# Patient Record
Sex: Female | Born: 1975 | Race: White | Hispanic: Yes | Marital: Single | Smoking: Never smoker
Health system: Southern US, Community
[De-identification: ages and names within clinical notes are randomized; demographics above are authoritative.]

## PROBLEM LIST (undated history)

## (undated) DIAGNOSIS — Z789 Other specified health status: Secondary | ICD-10-CM

## (undated) HISTORY — PX: NO PAST SURGERIES: SHX2092

## (undated) MED FILL — Goserelin Acetate Implant 3.6 MG: SUBCUTANEOUS | Qty: 3.6 | Status: AC

---

## 2008-04-27 ENCOUNTER — Ambulatory Visit (HOSPITAL_COMMUNITY): Admission: RE | Admit: 2008-04-27 | Discharge: 2008-04-27 | Payer: Self-pay | Admitting: Family Medicine

## 2008-04-27 IMAGING — US US BREAST*R*
1 series · 10 of 10 positions shown · non-contrast
Comparison: None

CLINICAL DATA: .  Thickening at 10 o'clock in the right breast.
History of prior benign surgical excisional biopsy on the left.

DIGITAL DIAGNOSTIC  BILATERAL  MAMMOGRAM  WITH CAD AND THE RIGHT
BREAST ULTRASOUND:

[Series 1: right breast · 0.07mm/px · 10 of 10 slices shown]
[im 1/10]
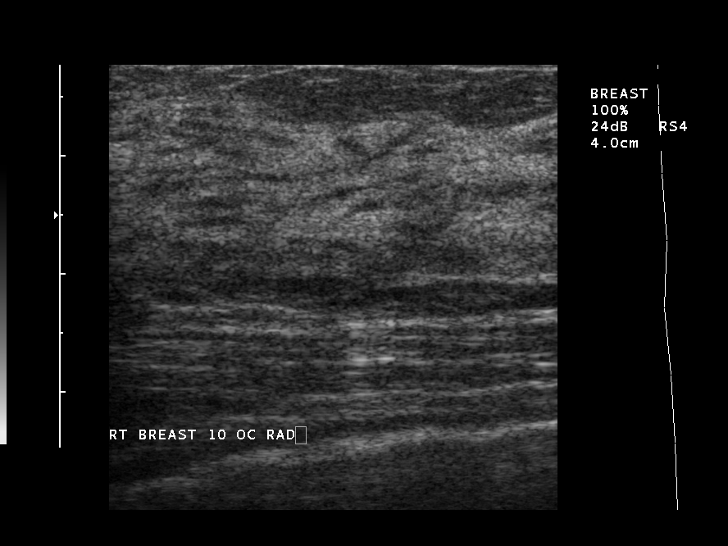
[im 2/10]
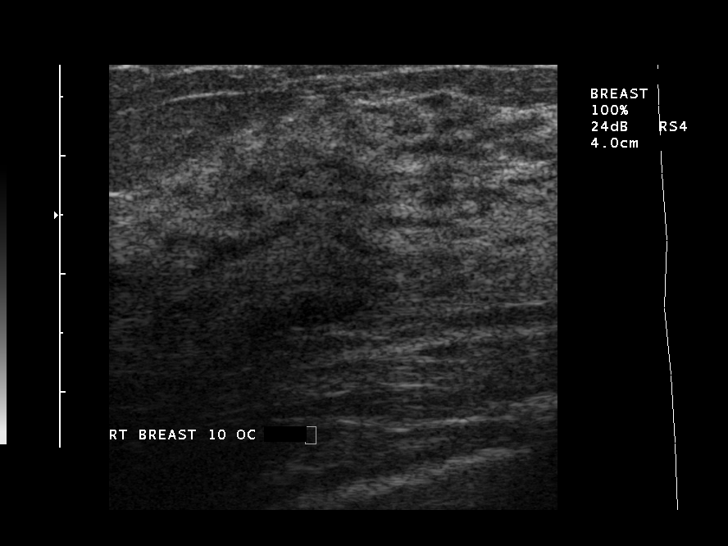
[im 3/10]
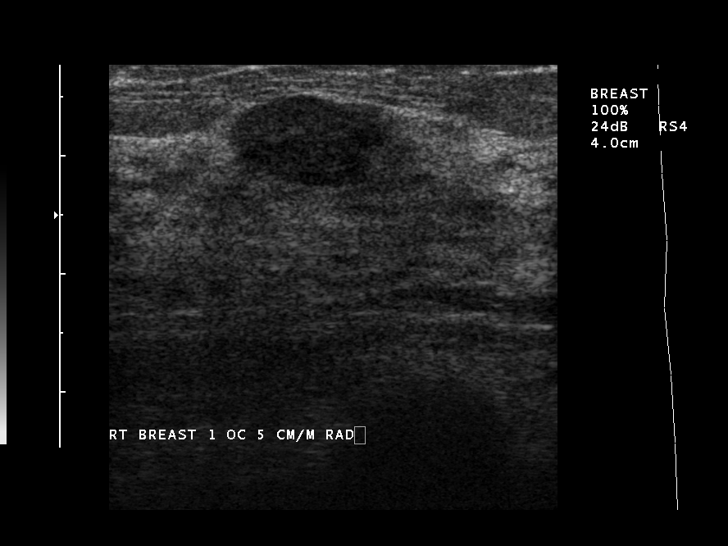
[im 4/10]
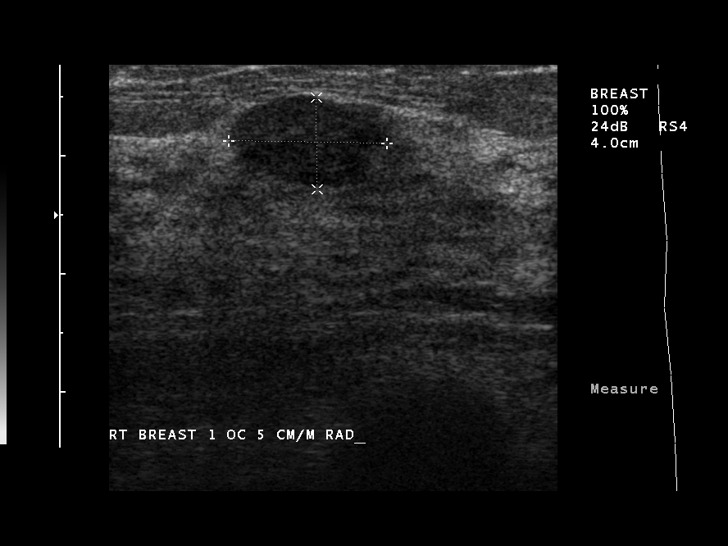
[im 5/10]
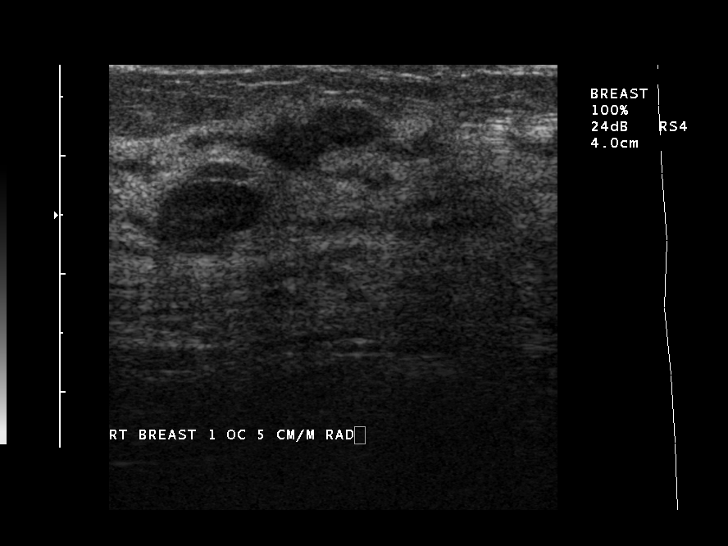
[im 6/10]
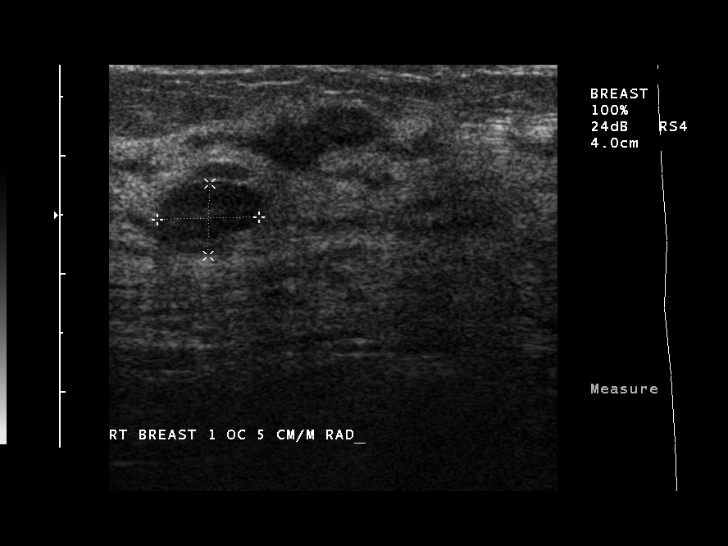
[im 7/10]
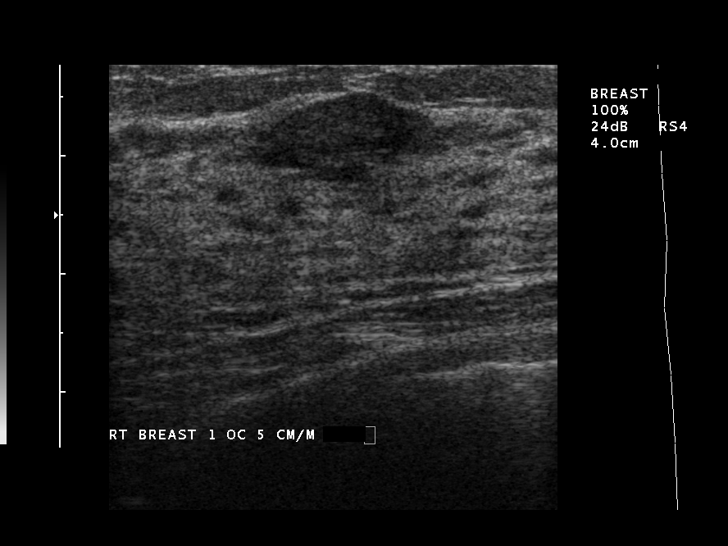
[im 8/10]
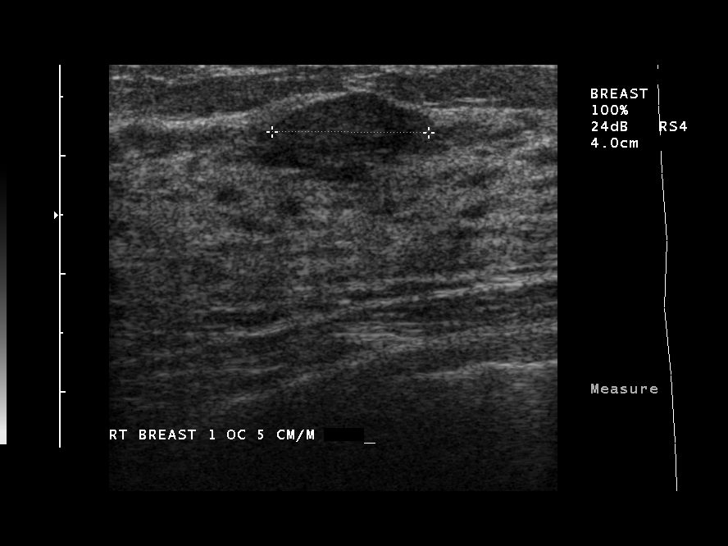
[im 9/10]
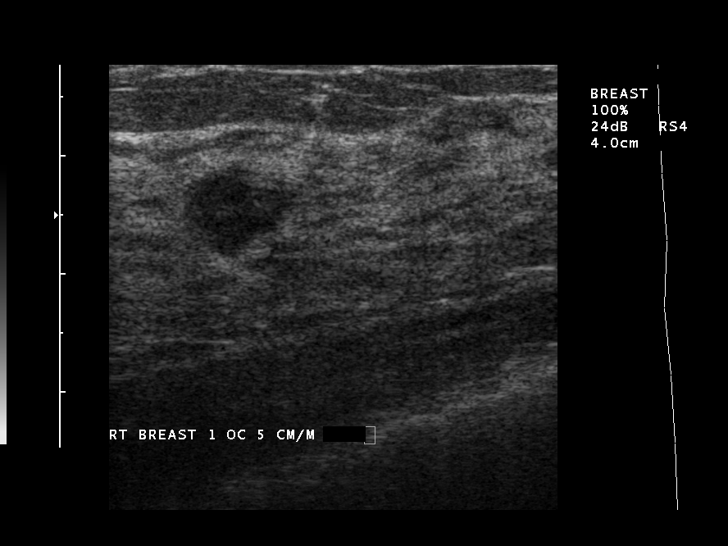
[im 10/10]
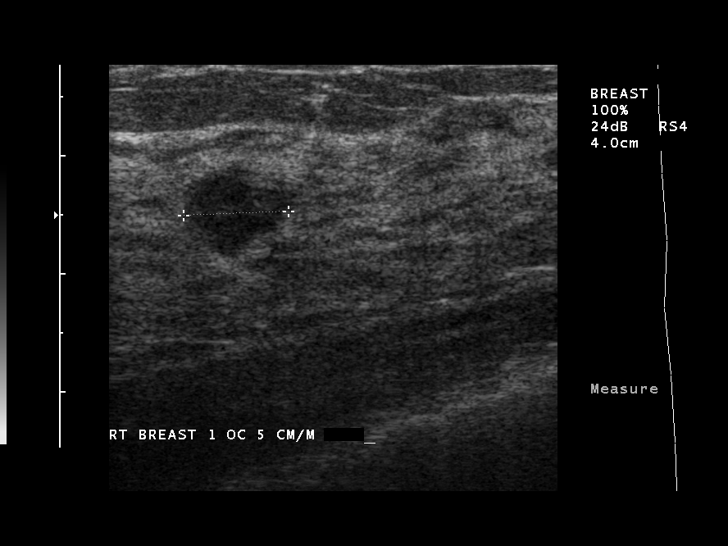

[10 of 10 positions shown; findings below may reference images not displayed]

FINDINGS: The breast tissue is extremely dense.  There is no
dominant mass, architectural distortion or calcification to suggest
malignancy.

On physical exam, no mass is palpated in the upper portion of the
right breast.

Ultrasound is performed, showing dense fibroglandular tissue in the
10 o'clock position of the right breast.  At 1 o'clock, there are
two adjacent oval homogeneous solid masses 5 cm from the right
nipple measuring 13 x 8 x 13 mm and 9 x 9 x 6 mm.  These are felt
very likely to represent benign fibroadenomas.  Findings were
discussed with the patient's husband at her request.  Options of
short interval follow-up ultrasound, needle biopsy and surgical
excisional biopsy were discussed.  I have recommended short
interval follow-up ultrasound in 6, 12 and 24 months.  The patient
and her husband agreed with this plan.
IMPRESSION: Probably benign fibroadenomas at 1 o'clock 5 cm from the right
nipple as described above.  Suggest short interval follow-up
ultrasound of the right breast in six, 12 and 24 months.

BI-RADS CATEGORY 3:  Probably benign finding(s) - short interval
follow-up suggested.

## 2009-08-15 ENCOUNTER — Ambulatory Visit (HOSPITAL_COMMUNITY): Admission: RE | Admit: 2009-08-15 | Discharge: 2009-08-15 | Payer: Self-pay | Admitting: Family Medicine

## 2009-08-15 IMAGING — MG MM DIGITAL DIAGNOSTIC BILAT
4 series · 4 of 4 positions shown · non-contrast
Comparison: [DATE]

CLINICAL DATA: The patient did not return for recommended 6-month
follow-up ultrasound of the right breast for probably benign
fibroadenomas.

DIGITAL DIAGNOSTIC BILATERAL MAMMOGRAM WITH CAD AND RIGHT BREAST
ULTRASOUND:

[L CC]
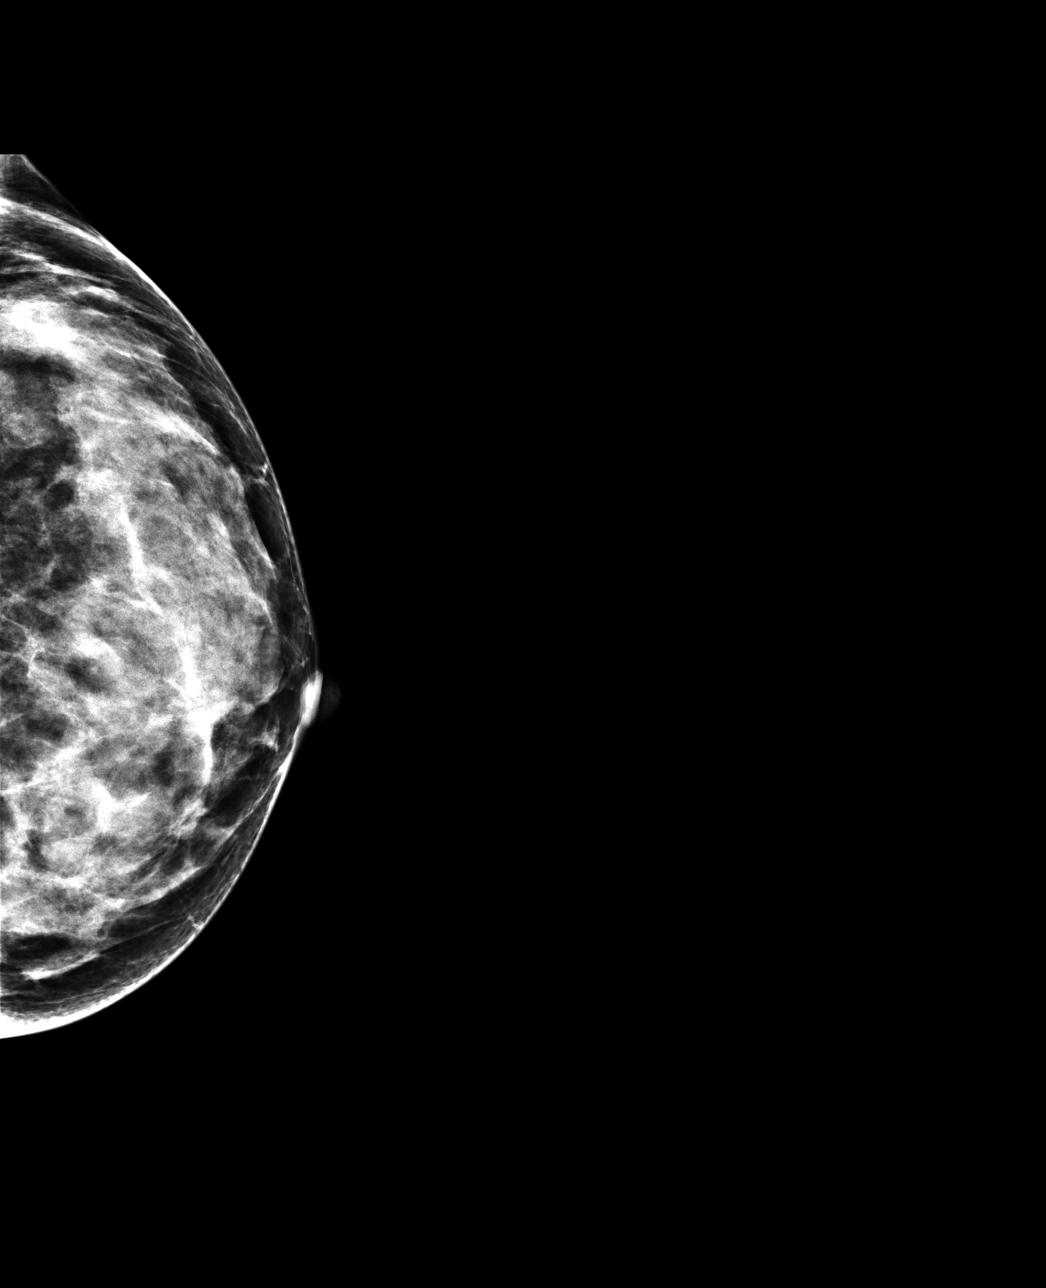

[L MLO]
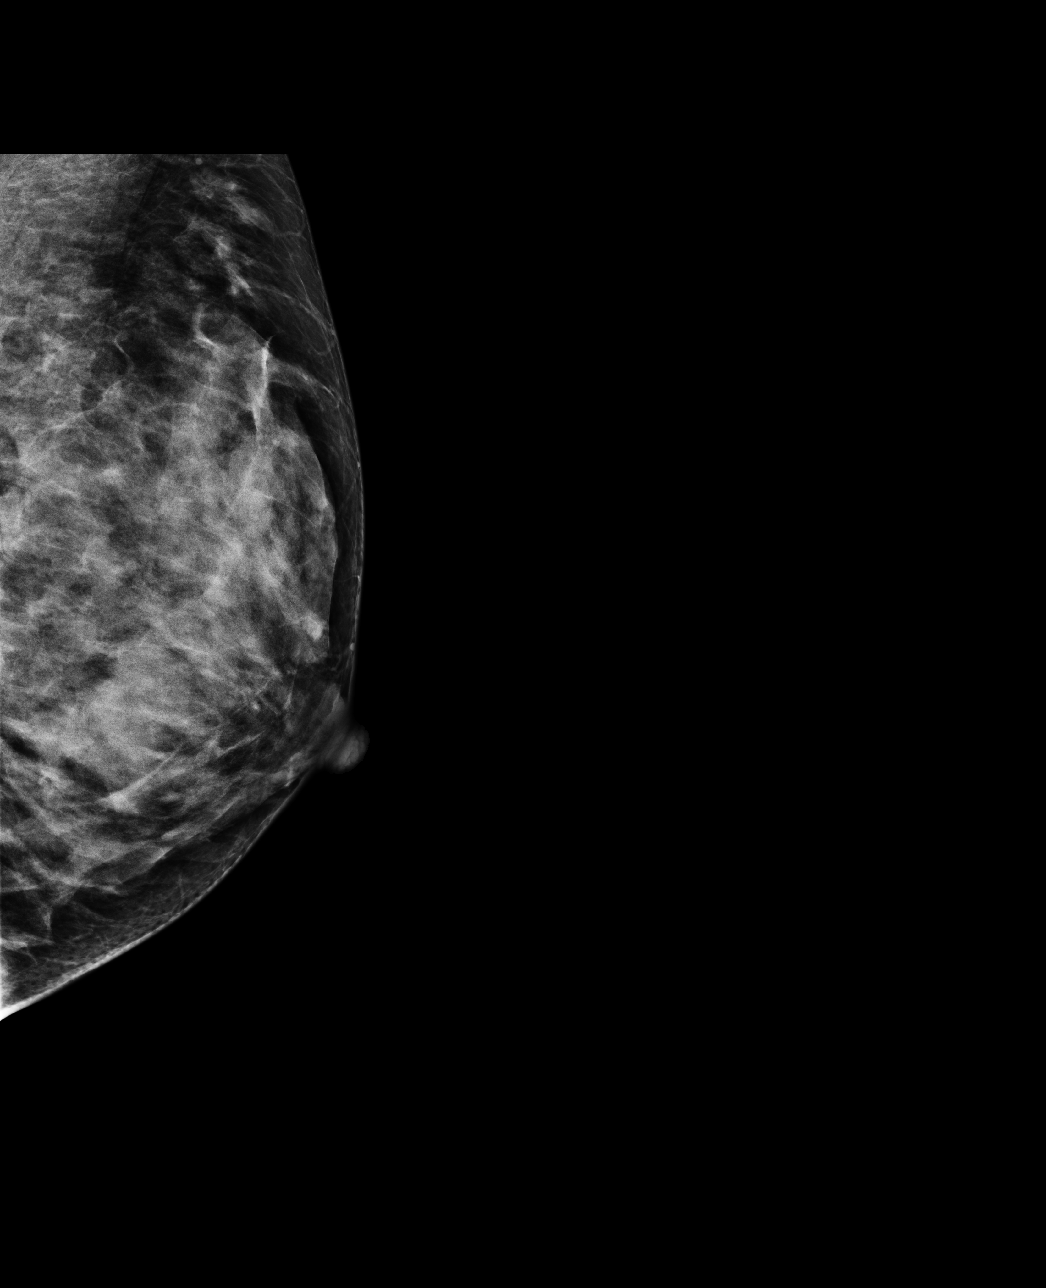

[R CC]
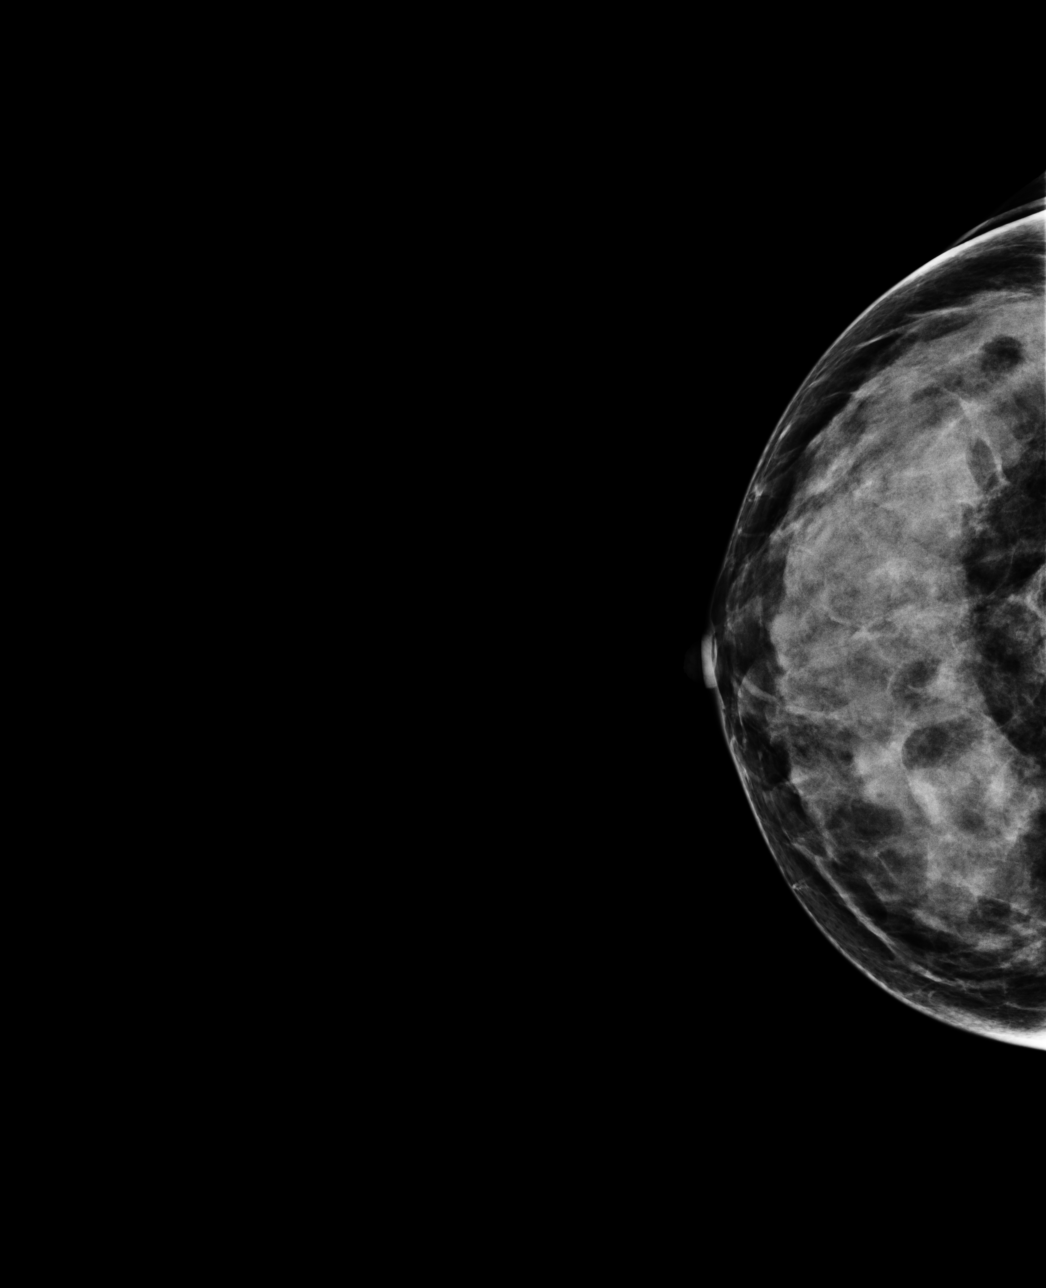

[R MLO]
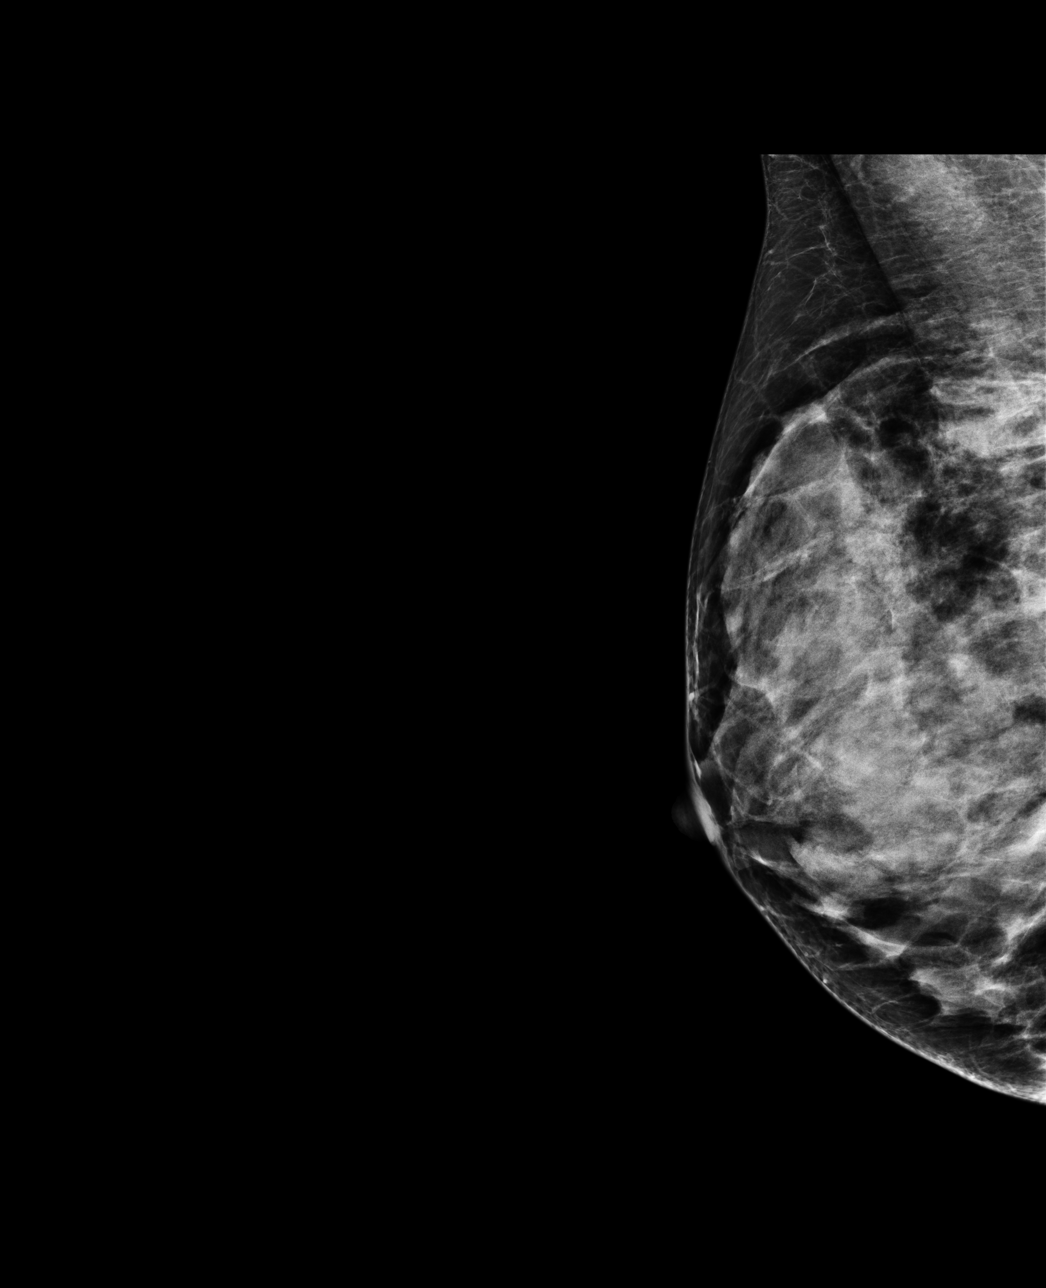

[4 of 4 positions shown; findings below may reference images not displayed]

FINDINGS: The breast tissue is extremely dense.  There is no
dominant mass, architectural distortion or calcification to suggest
malignancy.
Mammographic images were processed with CAD.

On physical exam, no mass is palpated in the right upper inner
quadrant.

Ultrasound is performed, showing two adjacent oval homogeneous
solid horizontally oriented well-defined masses at 1 o'clock, 5 cm
from the right nipple, measuring 10 x 6 x 8 mm and 14 x 13 x 6 mm.
These are stable as compared to prior study.  There is a third
similar mass at 1 o'clock 4 cm from the right nipple measuring 14 x
7 x 10 mm.  These are very likely benign fibroadenomas.  I would
suggest follow-up right breast ultrasound in 12 months.
IMPRESSION: Probably benign fibroadenomas in the right upper inner quadrant as
described above.  Suggest follow-up right breast ultrasound in 12
months.

BI-RADS CATEGORY 3:  Probably benign finding(s) - short interval
follow-up suggested.

## 2009-08-15 IMAGING — US US BREAST*R*
1 series · 12 of 12 positions shown · non-contrast
Comparison: [DATE]

CLINICAL DATA: The patient did not return for recommended 6-month
follow-up ultrasound of the right breast for probably benign
fibroadenomas.

DIGITAL DIAGNOSTIC BILATERAL MAMMOGRAM WITH CAD AND RIGHT BREAST
ULTRASOUND:

[Series 1: us breast*right* · 0.08mm/px · 12 of 12 slices shown]
[im 1/12]
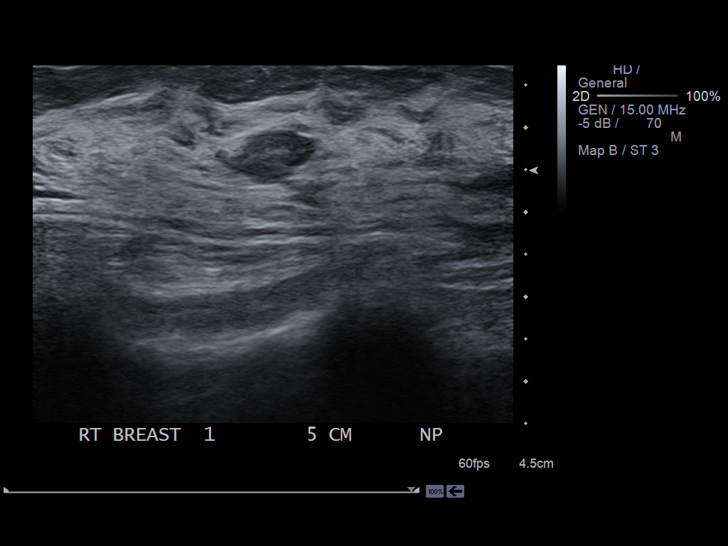
[im 2/12]
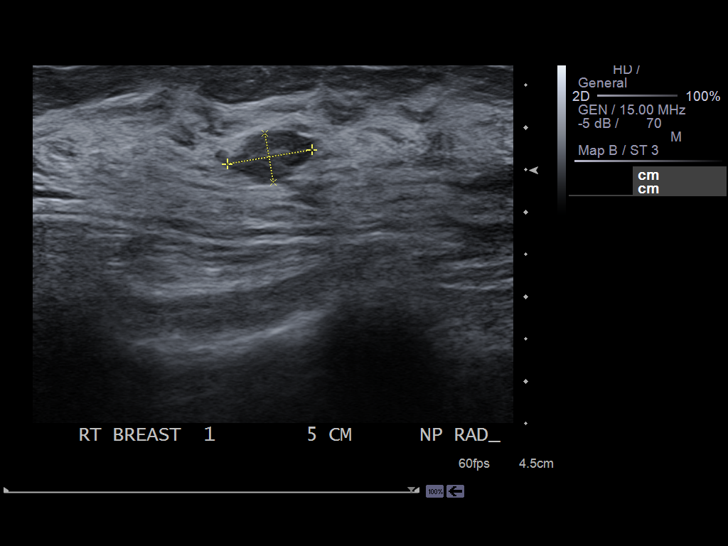
[im 3/12]
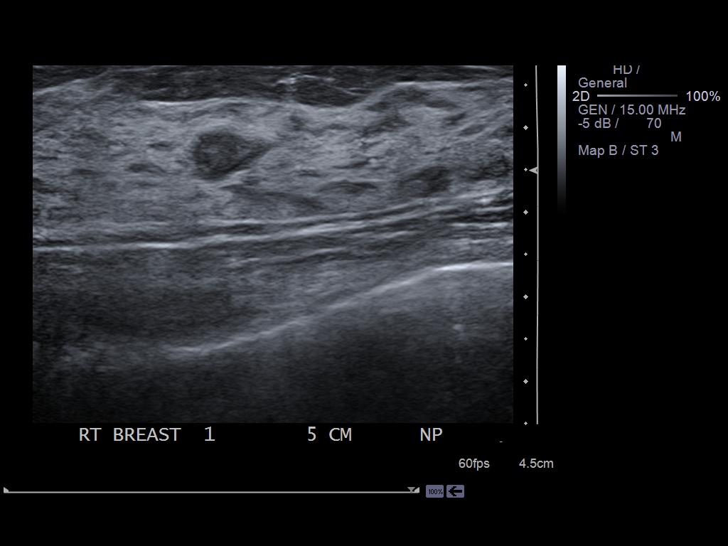
[im 4/12]
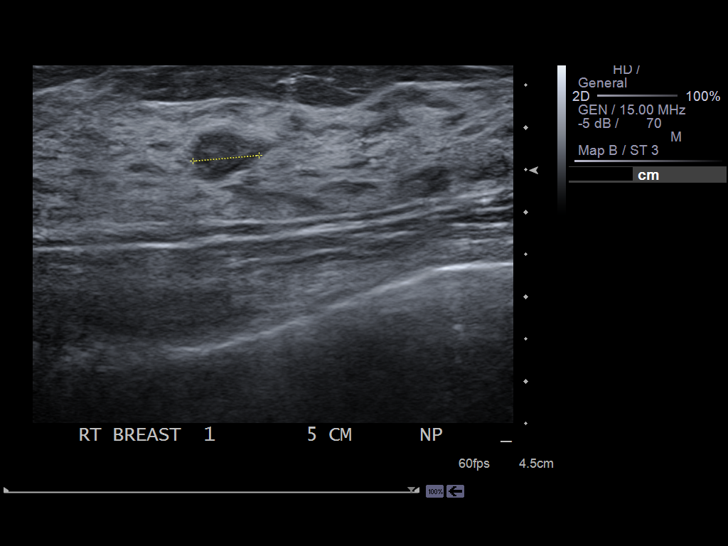
[im 5/12]
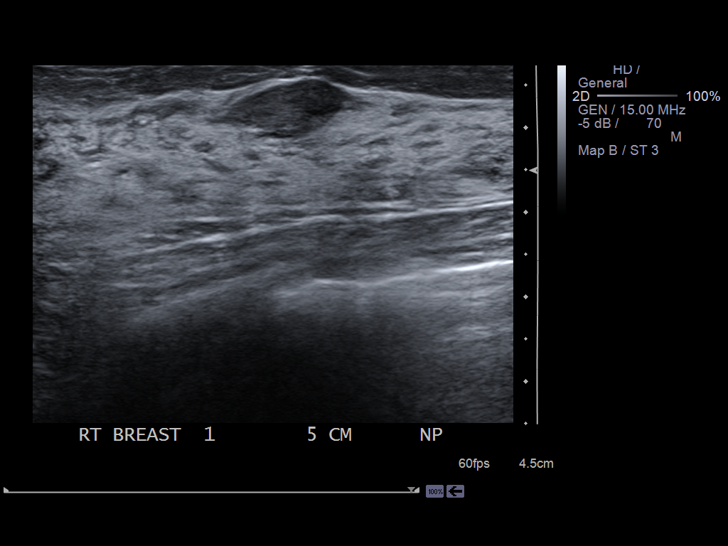
[im 6/12]
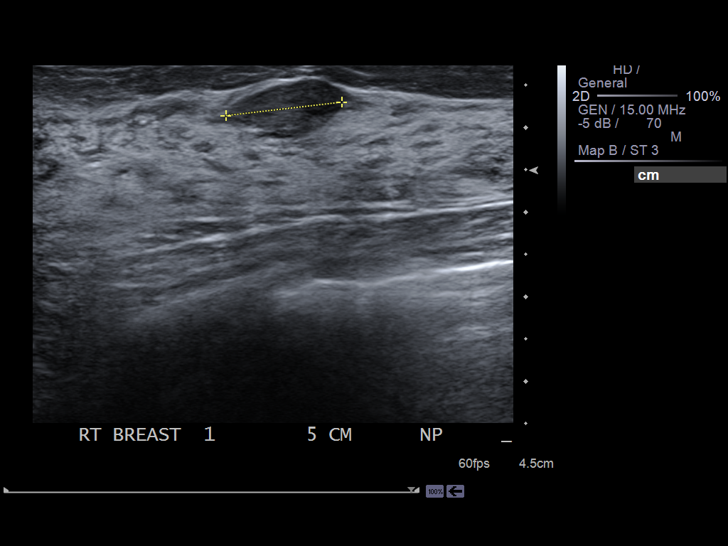
[im 7/12]
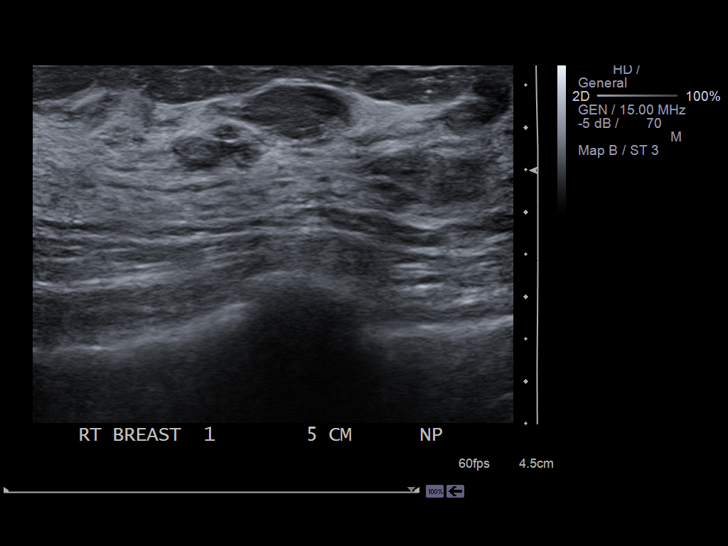
[im 8/12]
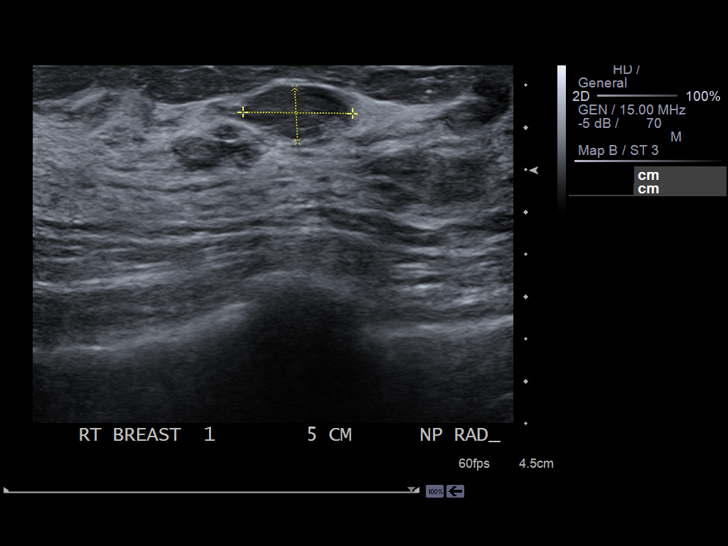
[im 9/12]
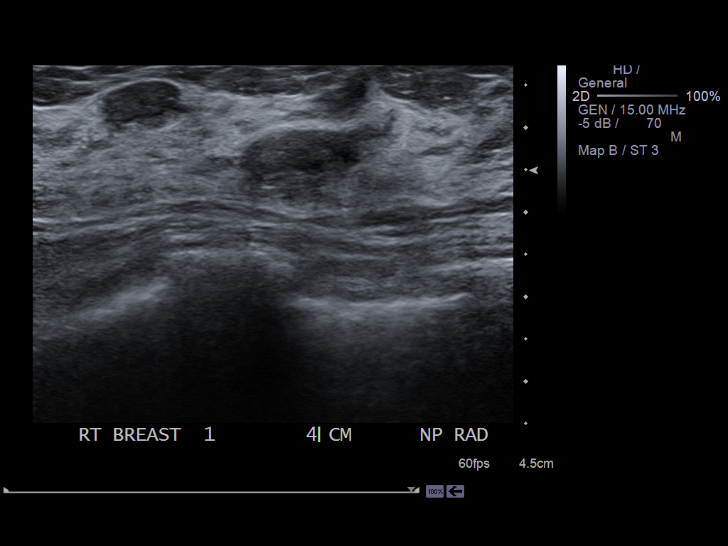
[im 10/12]
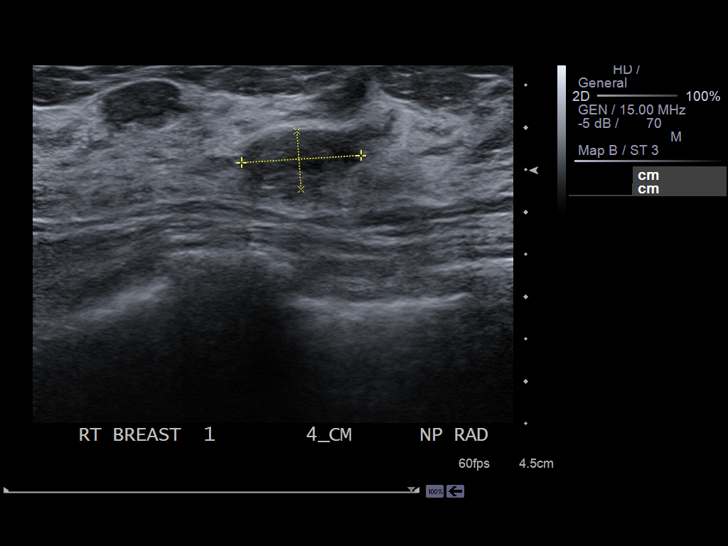
[im 11/12]
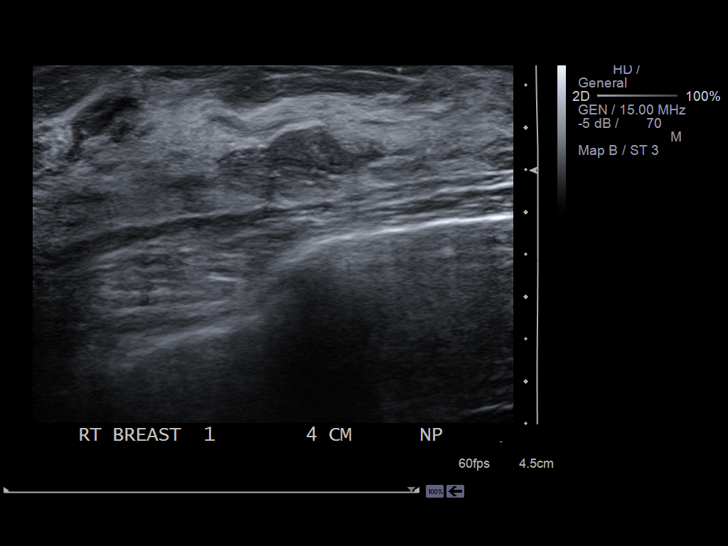
[im 12/12]
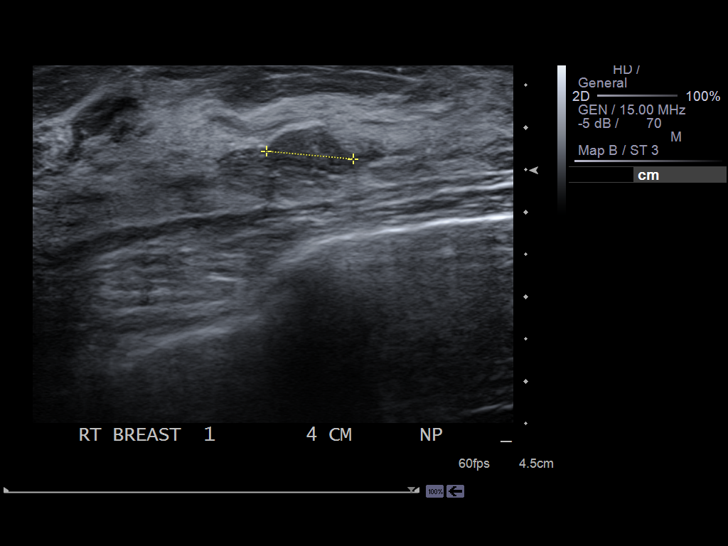

[12 of 12 positions shown; findings below may reference images not displayed]

FINDINGS: The breast tissue is extremely dense.  There is no
dominant mass, architectural distortion or calcification to suggest
malignancy.
Mammographic images were processed with CAD.

On physical exam, no mass is palpated in the right upper inner
quadrant.

Ultrasound is performed, showing two adjacent oval homogeneous
solid horizontally oriented well-defined masses at 1 o'clock, 5 cm
from the right nipple, measuring 10 x 6 x 8 mm and 14 x 13 x 6 mm.
These are stable as compared to prior study.  There is a third
similar mass at 1 o'clock 4 cm from the right nipple measuring 14 x
7 x 10 mm.  These are very likely benign fibroadenomas.  I would
suggest follow-up right breast ultrasound in 12 months.
IMPRESSION: Probably benign fibroadenomas in the right upper inner quadrant as
described above.  Suggest follow-up right breast ultrasound in 12
months.

BI-RADS CATEGORY 3:  Probably benign finding(s) - short interval
follow-up suggested.

## 2010-03-17 ENCOUNTER — Encounter: Payer: Self-pay | Admitting: Family Medicine

## 2019-04-26 ENCOUNTER — Other Ambulatory Visit (HOSPITAL_COMMUNITY): Payer: Self-pay | Admitting: Nurse Practitioner

## 2019-04-26 DIAGNOSIS — Z1231 Encounter for screening mammogram for malignant neoplasm of breast: Secondary | ICD-10-CM

## 2019-06-17 ENCOUNTER — Ambulatory Visit (HOSPITAL_COMMUNITY): Payer: Self-pay

## 2021-04-29 ENCOUNTER — Other Ambulatory Visit (HOSPITAL_COMMUNITY): Payer: Self-pay | Admitting: Radiology

## 2021-04-29 ENCOUNTER — Other Ambulatory Visit (HOSPITAL_COMMUNITY): Payer: Self-pay | Admitting: Obstetrics and Gynecology

## 2021-04-29 DIAGNOSIS — N6310 Unspecified lump in the right breast, unspecified quadrant: Secondary | ICD-10-CM

## 2021-04-30 ENCOUNTER — Other Ambulatory Visit: Payer: Self-pay

## 2021-04-30 ENCOUNTER — Encounter (INDEPENDENT_AMBULATORY_CARE_PROVIDER_SITE_OTHER): Payer: Self-pay

## 2021-04-30 ENCOUNTER — Ambulatory Visit: Payer: Self-pay | Admitting: *Deleted

## 2021-04-30 VITALS — BP 126/76 | Wt 148.2 lb

## 2021-04-30 DIAGNOSIS — N6311 Unspecified lump in the right breast, upper outer quadrant: Secondary | ICD-10-CM

## 2021-04-30 DIAGNOSIS — Z1239 Encounter for other screening for malignant neoplasm of breast: Secondary | ICD-10-CM

## 2021-04-30 NOTE — Patient Instructions (Signed)
Explained breast self awareness with [facility]. Patient did not need a Pap smear today due to last Pap smear was 04/28/2019. Let her know [facility]CP will cover Pap smears every 3 years unless has a history of abnormal Pap smears. Referred patient to [place] for a diagnostic mammogram. Appointment scheduled Tuesday, May 14, 2021 at 1430. Patient aware of appointment and will be there. Verania Farish verbalized understanding. ? ?Christop Hippert, Arvil Chaco, RN ?3:10 PM ? ? ? ? ?

## 2021-04-30 NOTE — Progress Notes (Signed)
Hannah Murray is a 46 y.o. female who presents to [facility] clinic today with complaint of right breast lump since 04/25/2021.  ?  ?Pap Smear: Pap smear not completed today. Last Pap smear was 04/28/2019 at [facility] clinic and was normal. Per patient has no history of an abnormal Pap smear. Last Pap smear result is not available in Epic. Last Pap smear result will be scanned into Epic. ?  ?Physical exam: ?Breasts ?Breasts symmetrical. No skin abnormalities bilateral breasts. No nipple retraction bilateral breasts. No nipple discharge bilateral breasts. No lymphadenopathy. No lumps palpated left breast. Palpated a 8 cm x 8 cm lump within the right breast between 8 o'clock and 11 o'clock 3 cm from the nipple. No complaints of pain or tenderness on exam.   ?  ?Pelvic/Bimanual ?Pap is not indicated today per [facility]CP guidelines. ?  ?Smoking History: ?Patient has never smoked. ?  ?Patient Navigation: ?Patient education provided. Access to services provided for patient through [place] program. Spanish interpreter Rudene Anda from [facility] provided.  ? ?Colorectal Cancer Screening: ?Per patient has never had colonoscopy completed. No complaints today.  ?  ?Breast and Cervical Cancer Risk Assessment: ?Patient does not have family history of breast cancer, known genetic mutations, or radiation treatment to the chest before age 40. Patient does not have history of cervical dysplasia, immunocompromised, or DES exposure in-utero. ? ?Risk Assessment   ? ? Risk Scores   ? ?   04/30/2021  ? Last edited by: Demetrius Revel, LPN  ? 5-year risk: 1.1 %  ? Lifetime risk: 13.4 %  ? ?  ?  ? ?  ? ? ?A: ?BCCCP exam without pap smear ?Complaint of right breast lump. ? ?P: ?Referred patient to [place] for a diagnostic mammogram. Appointment scheduled Tuesday, May 14, 2021 at 1430. ? ?Loletta Parish, RN ?04/30/2021 3:10 PM   ?

## 2021-05-06 ENCOUNTER — Ambulatory Visit (HOSPITAL_COMMUNITY)
Admission: RE | Admit: 2021-05-06 | Discharge: 2021-05-06 | Disposition: A | Discharge status: Home / Self Care | Payer: Self-pay | Source: Ambulatory Visit | Attending: Obstetrics and Gynecology | Admitting: Obstetrics and Gynecology

## 2021-05-06 ENCOUNTER — Other Ambulatory Visit: Payer: Self-pay

## 2021-05-06 ENCOUNTER — Other Ambulatory Visit (HOSPITAL_COMMUNITY): Payer: Self-pay | Admitting: Obstetrics and Gynecology

## 2021-05-06 DIAGNOSIS — R928 Other abnormal and inconclusive findings on diagnostic imaging of breast: Secondary | ICD-10-CM

## 2021-05-06 DIAGNOSIS — N6310 Unspecified lump in the right breast, unspecified quadrant: Secondary | ICD-10-CM | POA: Insufficient documentation

## 2021-05-06 IMAGING — US US BREAST*R* LIMITED INC AXILLA
1 series · 13 of 14 positions shown · non-contrast
Comparison: Previous exam(s).

CLINICAL DATA: 43-year-old female presenting for evaluation of a
palpable lump in the upper-outer right breast.

EXAM:
DIGITAL DIAGNOSTIC BILATERAL MAMMOGRAM WITH TOMOSYNTHESIS AND CAD;
ULTRASOUND RIGHT BREAST LIMITED
TECHNIQUE: Bilateral digital diagnostic mammography and breast tomosynthesis
was performed. The images were evaluated with computer-aided
detection.; Targeted ultrasound examination of the right breast was
performed

[Series 1: us breast*right* limited inc axilla · 0.07mm/px · 14 acquisitions, 13 frames shown]
[im 1/14]
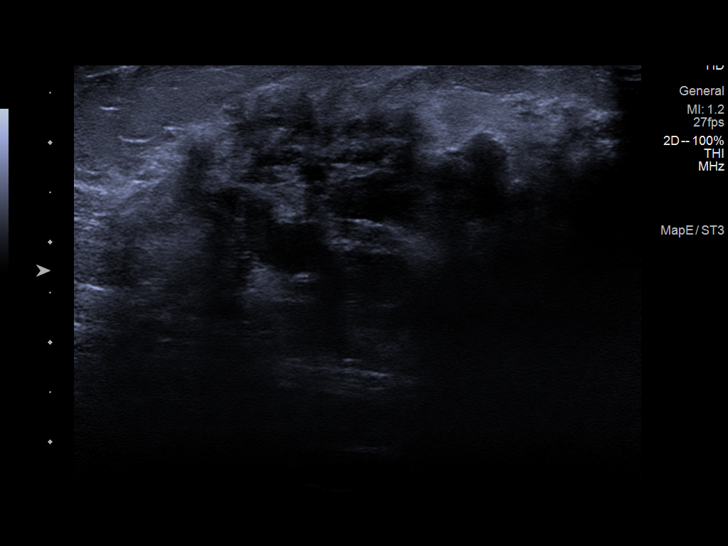
[im 2/14]
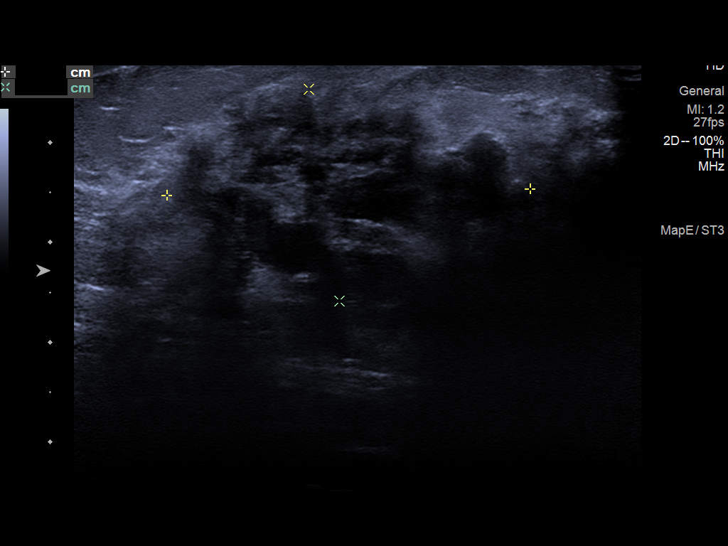
[im 3/14]
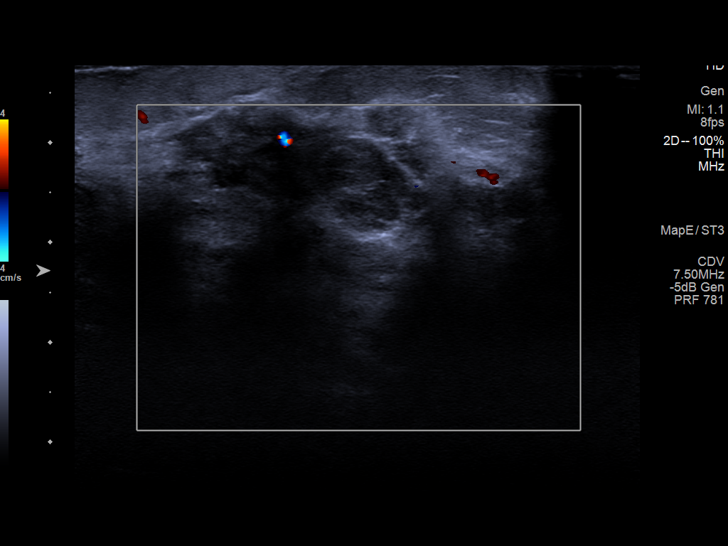
[im 4/14]
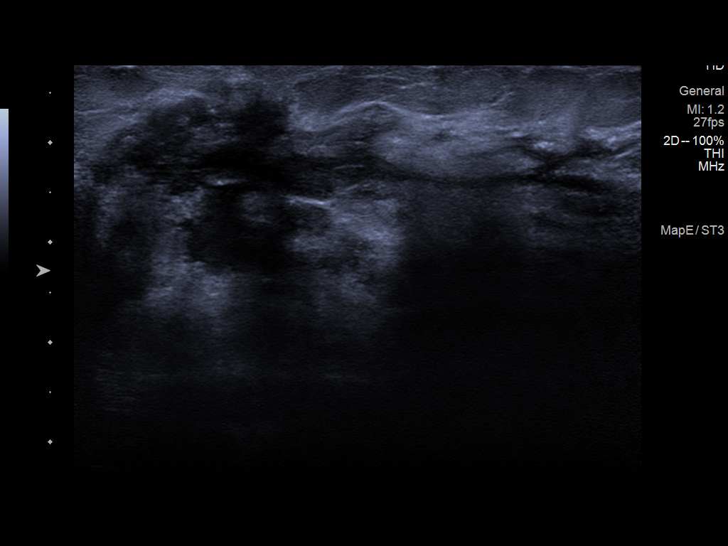
[im 5/14]
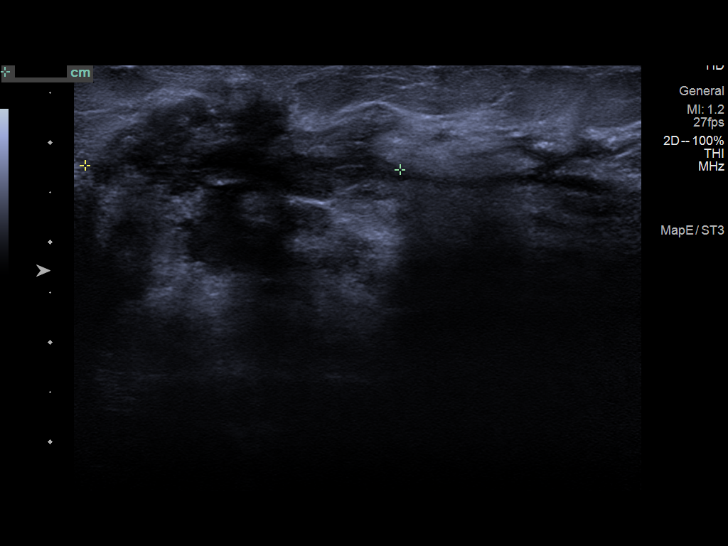
[im 6/14]
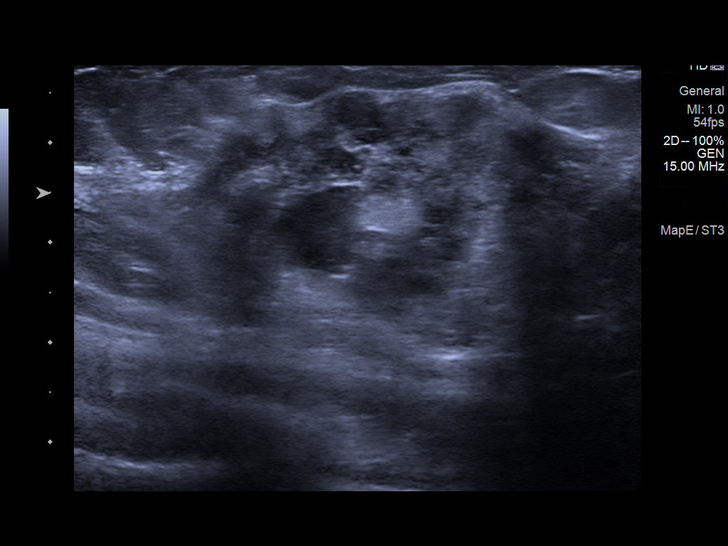
[im 8/14]
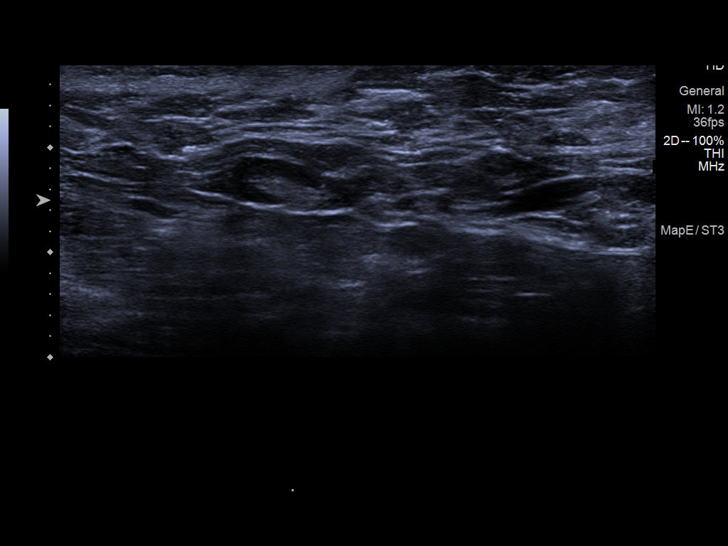
[im 9/14]
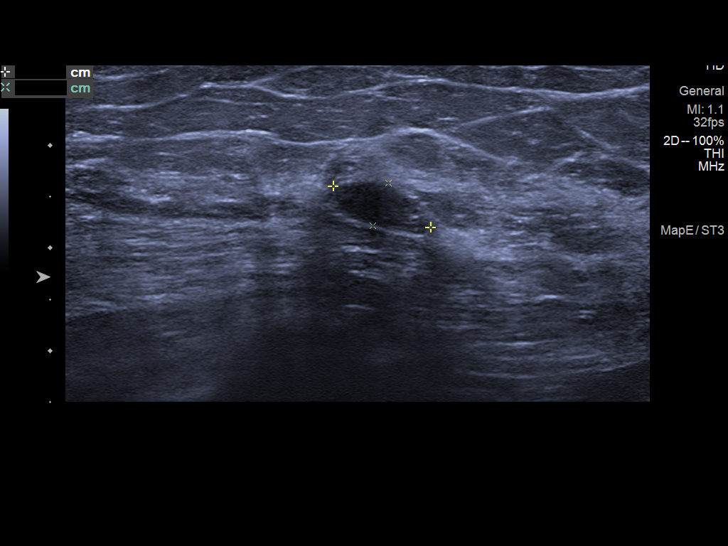
[im 10/14]
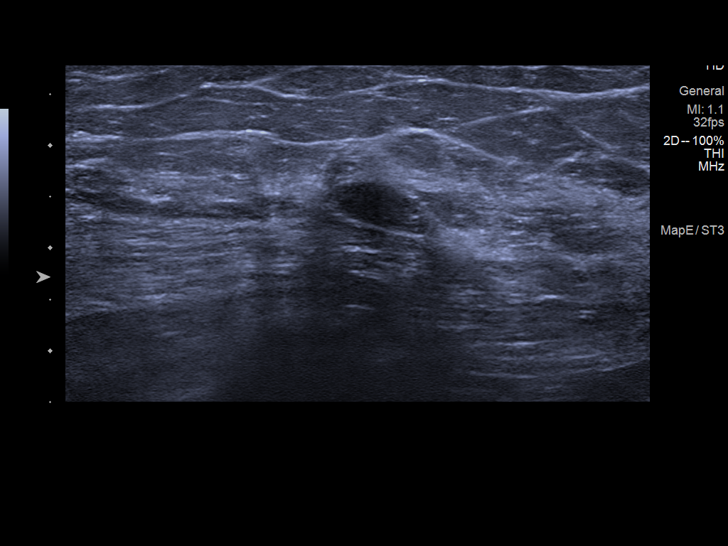
[im 11/14]
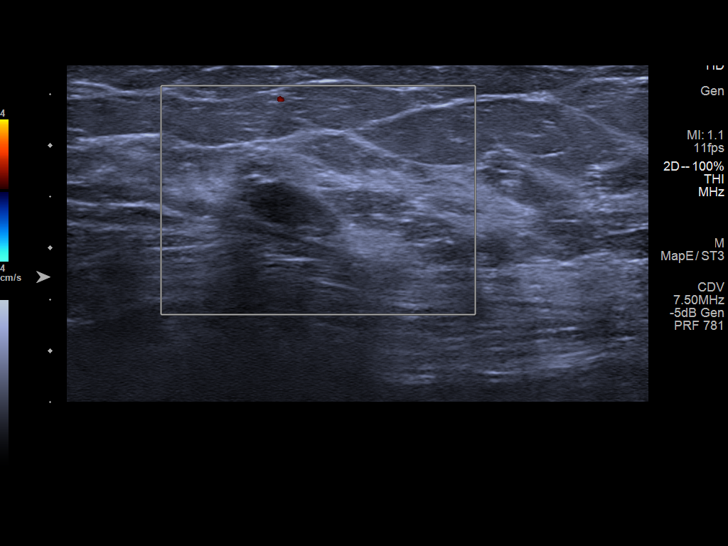
[im 12/14]
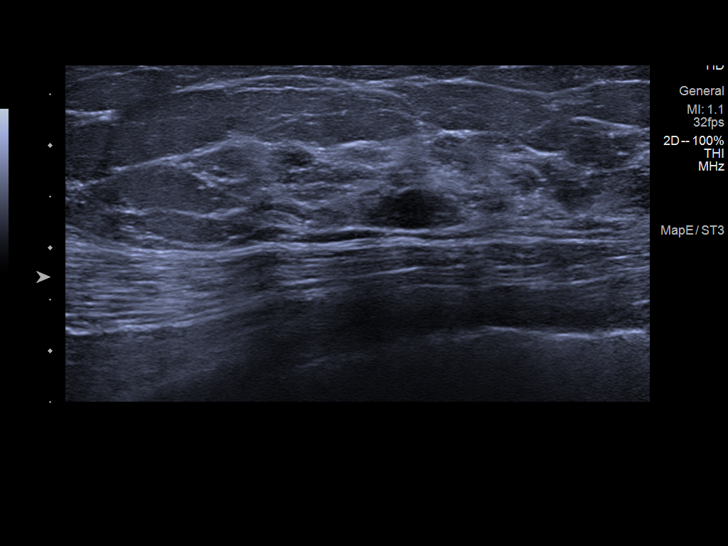
[im 13/14]
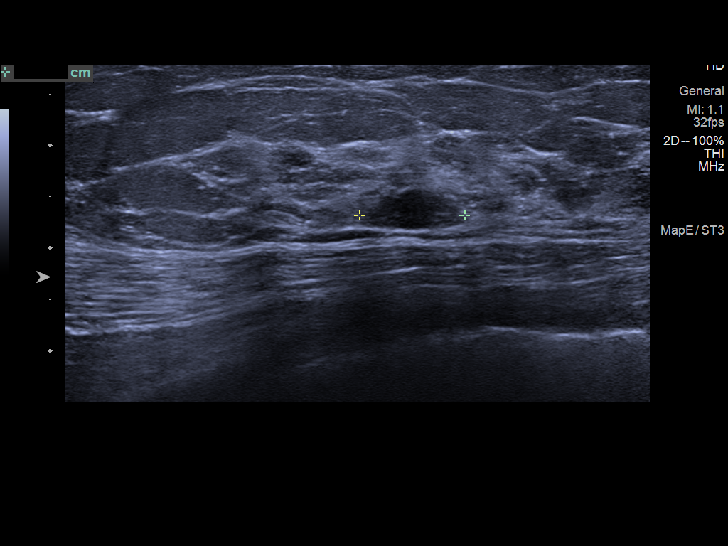
[im 14/14]
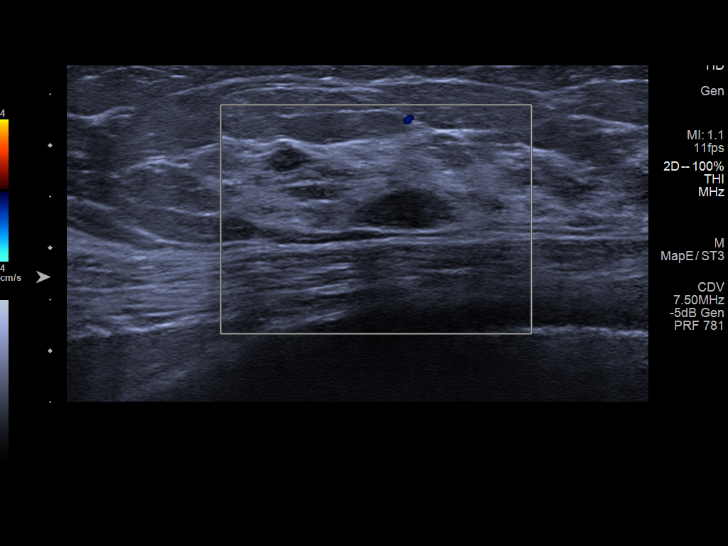

[13 of 14 positions shown; findings below may reference images not displayed]

ACR Breast Density Category c: The breast tissue is heterogeneously
dense, which may obscure small masses.
FINDINGS: In the upper-outer quadrant of the right breast in the area of the
palpable site there is a broad area of distortion. There is an
asymmetry in the central posterior right breast. No other suspicious
calcifications, masses or areas of distortion are seen in the
bilateral breasts.

Ultrasound targeted to the right breast at 10 o'clock, 5 cm from the
nipple demonstrates an irregular hypoechoic ill-defined mass
measuring at least 3.6 x 2.1 x 3.2 cm. Ultrasound of the right
breast at [DATE], 8 cm from the nipple demonstrates a hypoechoic oval
circumscribed mass measuring 1.0 x 0.4 x 1.0 cm. No abnormal lymph
nodes are found in the right axilla.
IMPRESSION: 1. There is a highly suspicious ill-defined palpable lump in the
right breast at 10 o'clock measuring at least 3.6 cm.

2. There is an indeterminate mass in the right breast at [DATE],
which may represent a fibroadenoma.

3.  No evidence of right axillary lymphadenopathy.

4.  No evidence of malignancy in the left breast.

RECOMMENDATION:
1. Ultrasound guided biopsy is recommended for the right breast mass
at 10 o'clock and the right breast mass at [DATE]. We will work with
the patient to schedule the patient at her earliest convenience.

2. Following biopsy, bilateral breast MRI is recommended to
determine the extent of disease given the patient's breast tissue
density, age and ill-defined appearance of the suspicious mass
making it difficult to accurately measure.

I have discussed the findings and recommendations with the patient.
If applicable, a reminder letter will be sent to the patient
regarding the next appointment.

BI-RADS CATEGORY  5: Highly suggestive of malignancy.

## 2021-05-06 IMAGING — MG DIGITAL DIAGNOSTIC BILAT W/ TOMO W/ CAD
6 of 12 series · 6 of 36 positions shown · non-contrast
Comparison: Previous exam(s).

CLINICAL DATA: 43-year-old female presenting for evaluation of a
palpable lump in the upper-outer right breast.

EXAM:
DIGITAL DIAGNOSTIC BILATERAL MAMMOGRAM WITH TOMOSYNTHESIS AND CAD;
ULTRASOUND RIGHT BREAST LIMITED
TECHNIQUE: Bilateral digital diagnostic mammography and breast tomosynthesis
was performed. The images were evaluated with computer-aided
detection.; Targeted ultrasound examination of the right breast was
performed

[R CC synth-2D (1 of 2)]
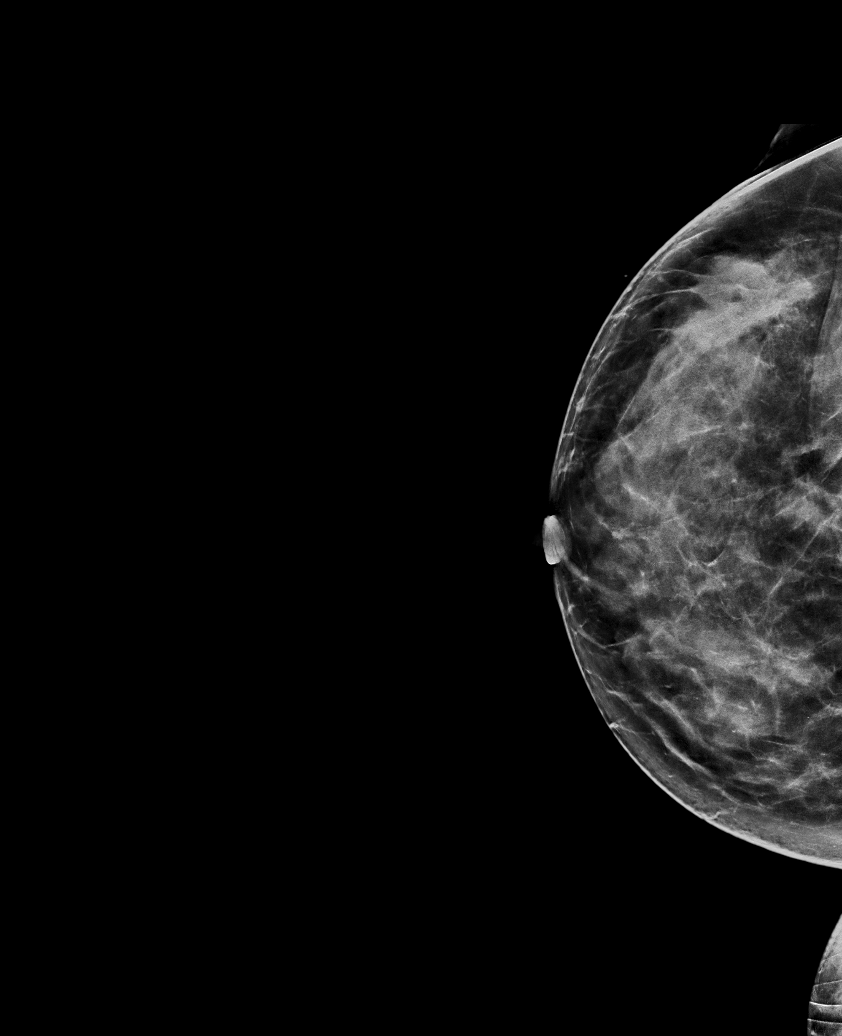

[R MLO synth-2D (1 of 2)]
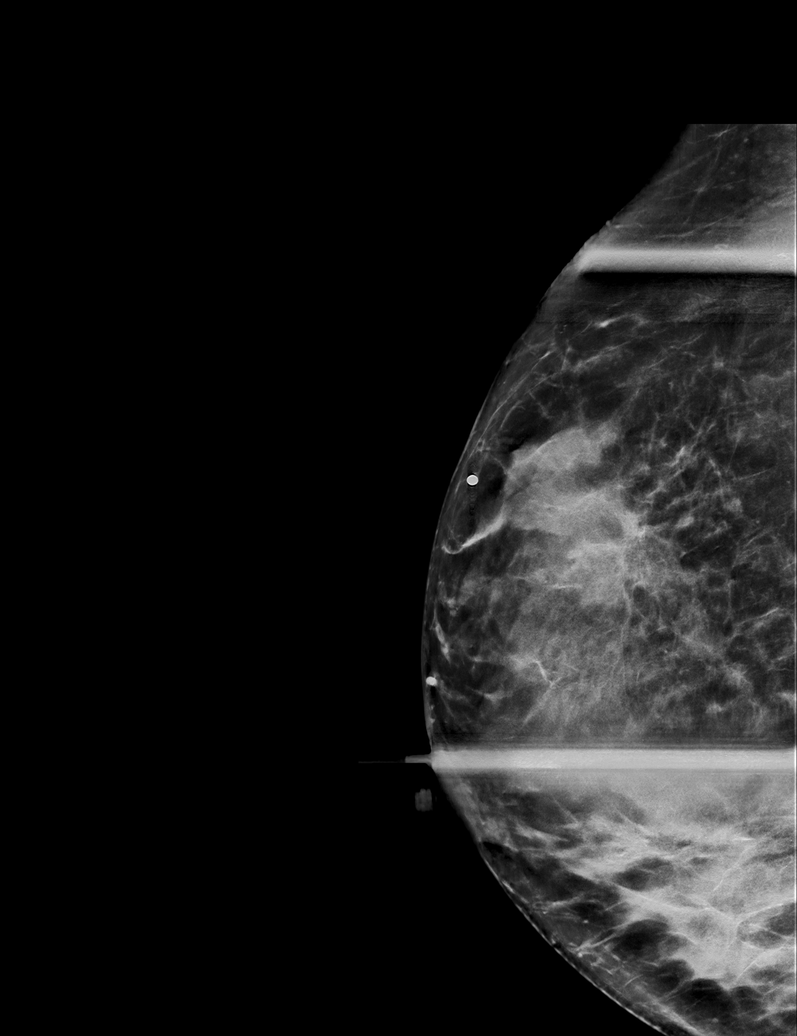

[L CC synth-2D]
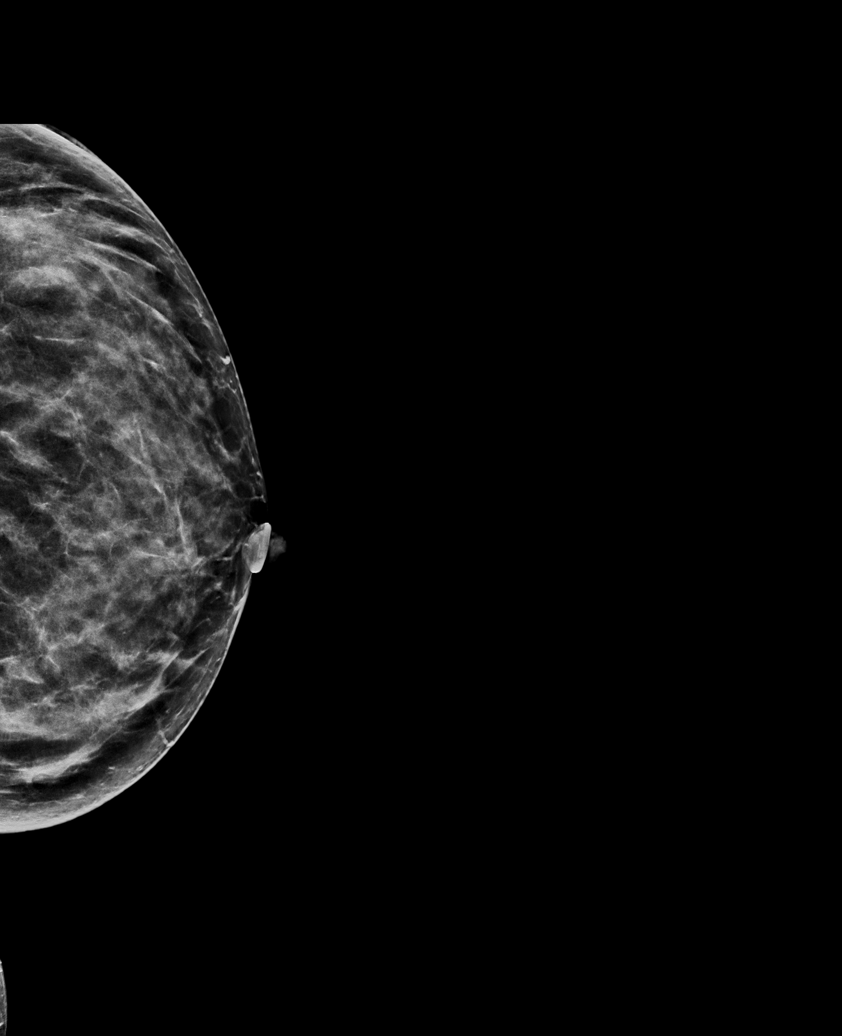

[L MLO synth-2D]
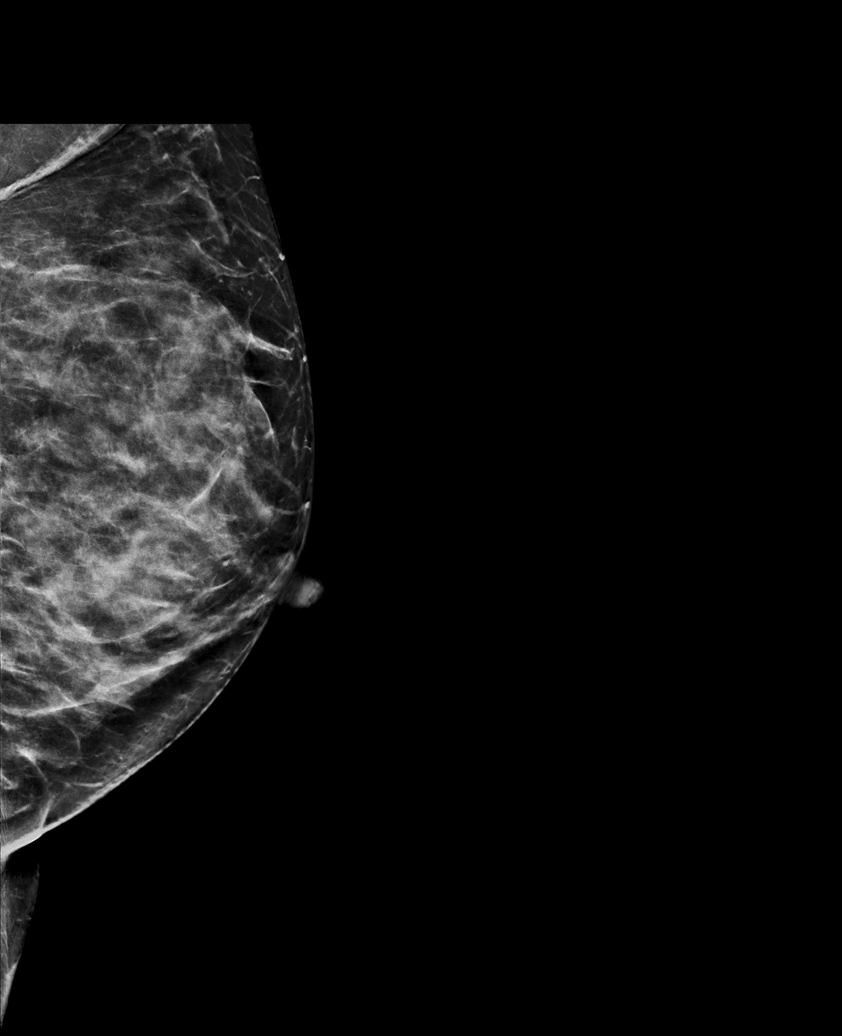

[R MLO synth-2D (2 of 2)]
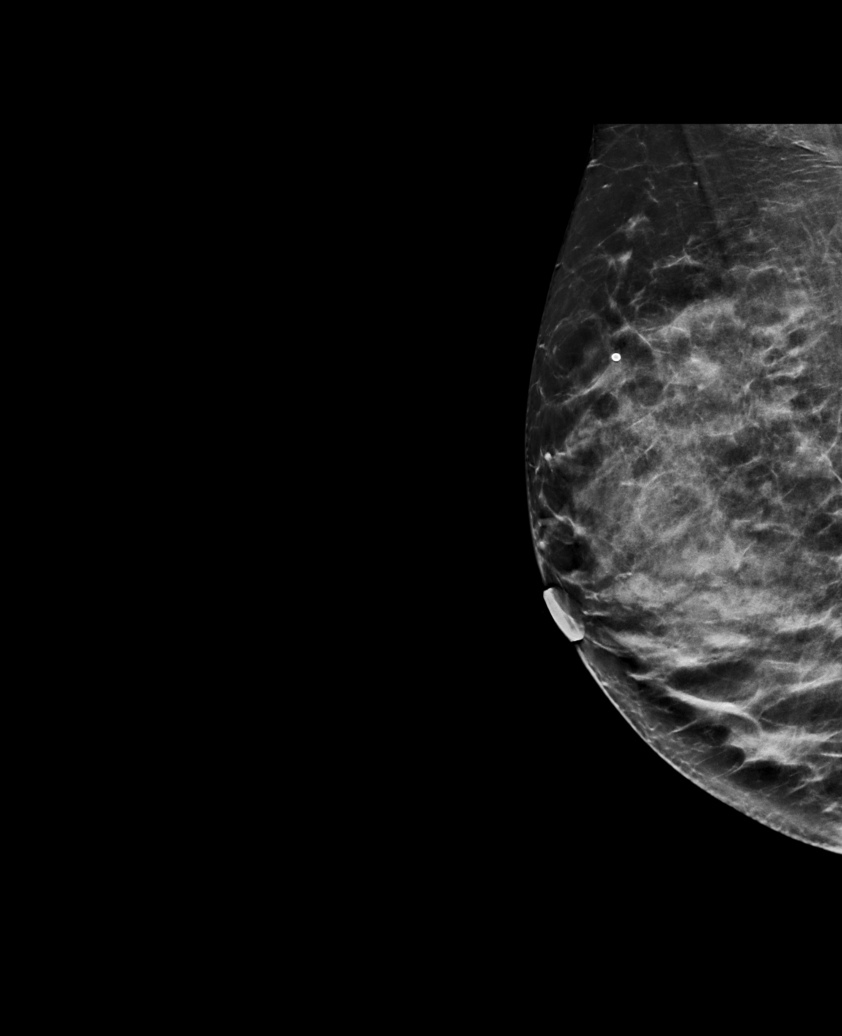

[R CC synth-2D (2 of 2)]
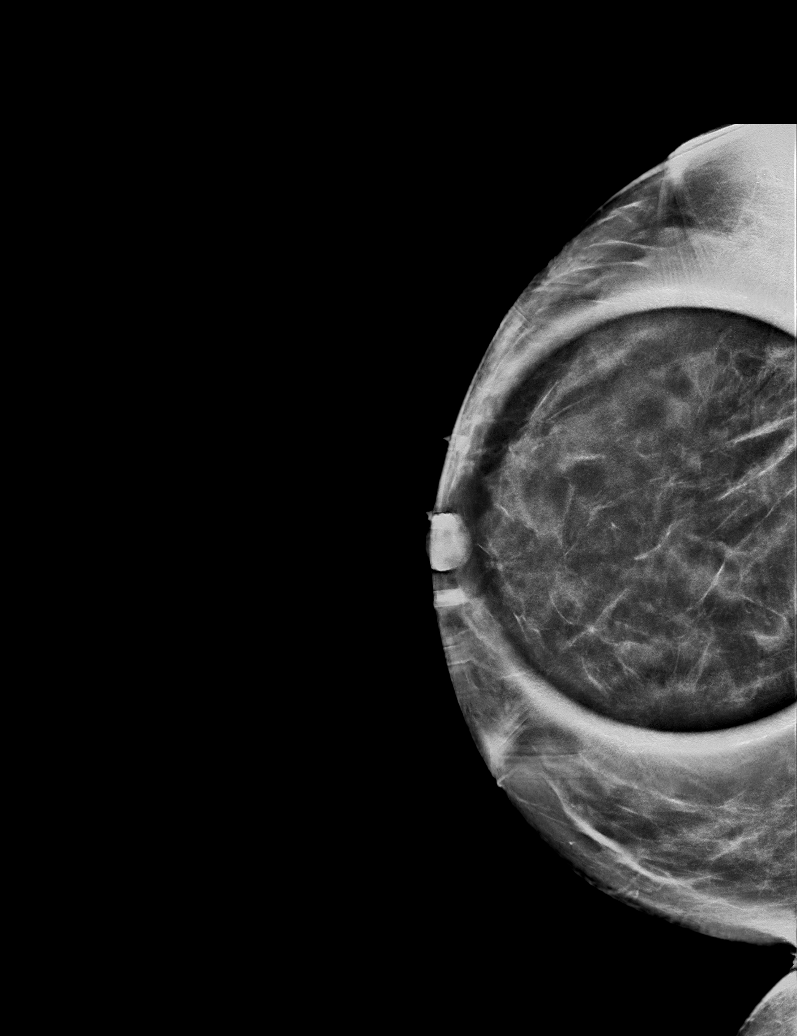

[6 of 36 positions shown; findings below may reference images not displayed]

ACR Breast Density Category c: The breast tissue is heterogeneously
dense, which may obscure small masses.
FINDINGS: In the upper-outer quadrant of the right breast in the area of the
palpable site there is a broad area of distortion. There is an
asymmetry in the central posterior right breast. No other suspicious
calcifications, masses or areas of distortion are seen in the
bilateral breasts.

Ultrasound targeted to the right breast at 10 o'clock, 5 cm from the
nipple demonstrates an irregular hypoechoic ill-defined mass
measuring at least 3.6 x 2.1 x 3.2 cm. Ultrasound of the right
breast at [DATE], 8 cm from the nipple demonstrates a hypoechoic oval
circumscribed mass measuring 1.0 x 0.4 x 1.0 cm. No abnormal lymph
nodes are found in the right axilla.
IMPRESSION: 1. There is a highly suspicious ill-defined palpable lump in the
right breast at 10 o'clock measuring at least 3.6 cm.

2. There is an indeterminate mass in the right breast at [DATE],
which may represent a fibroadenoma.

3.  No evidence of right axillary lymphadenopathy.

4.  No evidence of malignancy in the left breast.

RECOMMENDATION:
1. Ultrasound guided biopsy is recommended for the right breast mass
at 10 o'clock and the right breast mass at [DATE]. We will work with
the patient to schedule the patient at her earliest convenience.

2. Following biopsy, bilateral breast MRI is recommended to
determine the extent of disease given the patient's breast tissue
density, age and ill-defined appearance of the suspicious mass
making it difficult to accurately measure.

I have discussed the findings and recommendations with the patient.
If applicable, a reminder letter will be sent to the patient
regarding the next appointment.

BI-RADS CATEGORY  5: Highly suggestive of malignancy.

## 2021-05-14 ENCOUNTER — Other Ambulatory Visit (HOSPITAL_COMMUNITY): Payer: Self-pay

## 2021-05-14 ENCOUNTER — Encounter (HOSPITAL_COMMUNITY): Payer: Self-pay

## 2021-05-16 ENCOUNTER — Other Ambulatory Visit (HOSPITAL_COMMUNITY): Payer: Self-pay | Admitting: Obstetrics and Gynecology

## 2021-05-16 ENCOUNTER — Ambulatory Visit (HOSPITAL_COMMUNITY)
Admission: RE | Admit: 2021-05-16 | Discharge: 2021-05-16 | Disposition: A | Discharge status: Home / Self Care | Payer: Self-pay | Source: Ambulatory Visit | Attending: Obstetrics and Gynecology | Admitting: Obstetrics and Gynecology

## 2021-05-16 ENCOUNTER — Other Ambulatory Visit (HOSPITAL_COMMUNITY): Payer: Self-pay

## 2021-05-16 ENCOUNTER — Other Ambulatory Visit: Payer: Self-pay

## 2021-05-16 DIAGNOSIS — R928 Other abnormal and inconclusive findings on diagnostic imaging of breast: Secondary | ICD-10-CM

## 2021-05-16 HISTORY — PX: BREAST BIOPSY: SHX20

## 2021-05-16 IMAGING — US US  BREAST BX W/ LOC DEV 1ST LESION IMG BX SPEC US GUIDE*R*
1 series · 16 of 25 positions shown · non-contrast
Comparison: Previous exam(s).
COMPARISON: Previous exam(s).

Addendum:
CLINICAL DATA: 43-year-old female presents for ultrasound-guided
core biopsy of an at least 3.6 cm mass in the right breast at the 10
o'clock position as well as an indeterminate 1 cm mass in the right
breast at the [DATE] position.

EXAM:
ULTRASOUND GUIDED RIGHT BREAST CORE NEEDLE BIOPSY

[Series 1: us breast bx w/ loc dev 1st lesion img bx spec us  · 0.07mm/px · 16 of 44 slices shown]
[im 1/44]
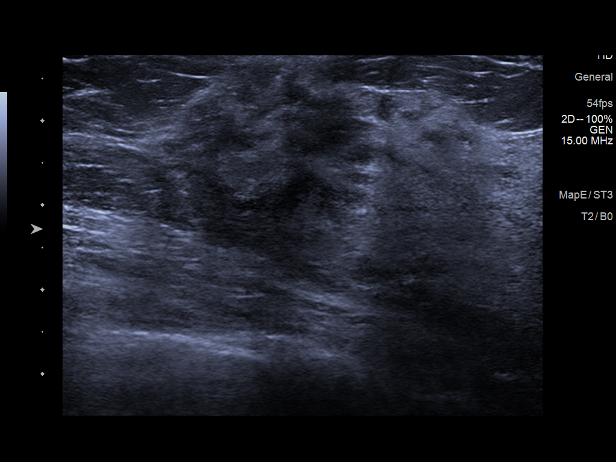
[im 4/44]
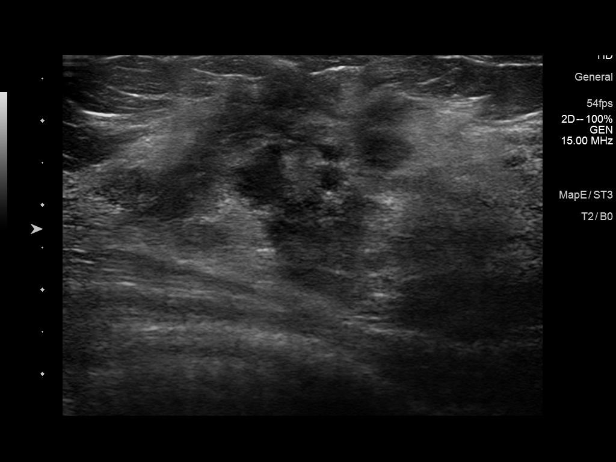
[im 6/44]
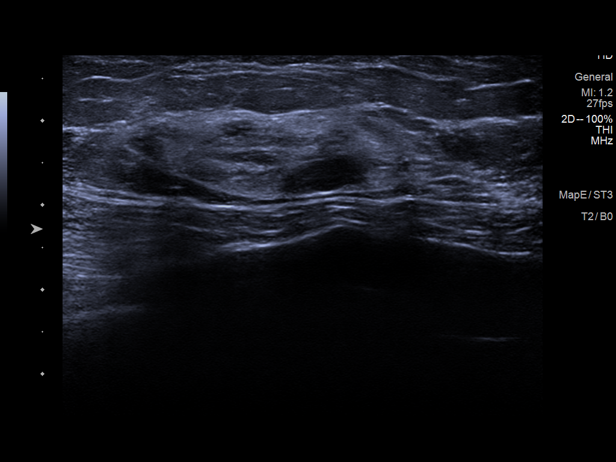
[im 9/44]
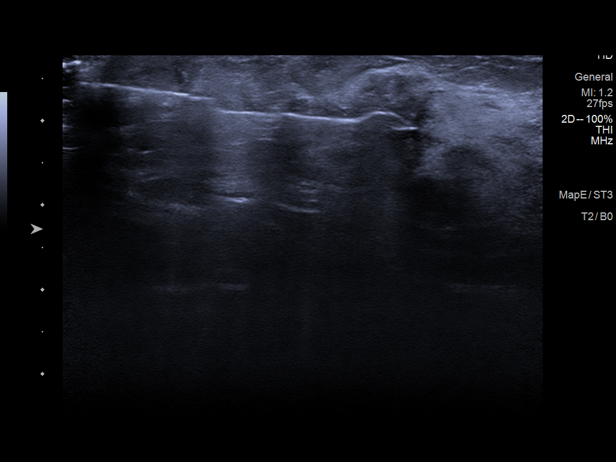
[im 13/44]
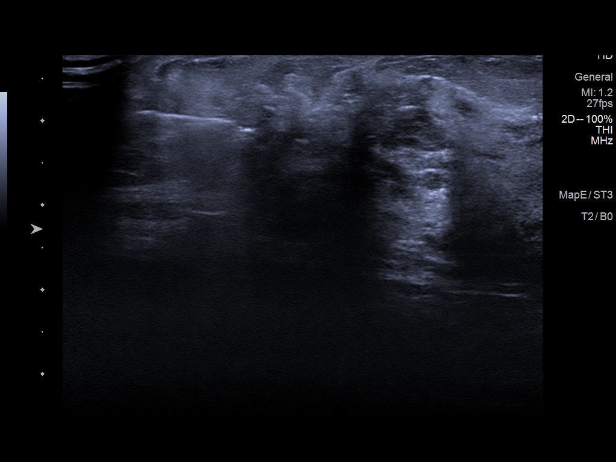
[im 15/44]
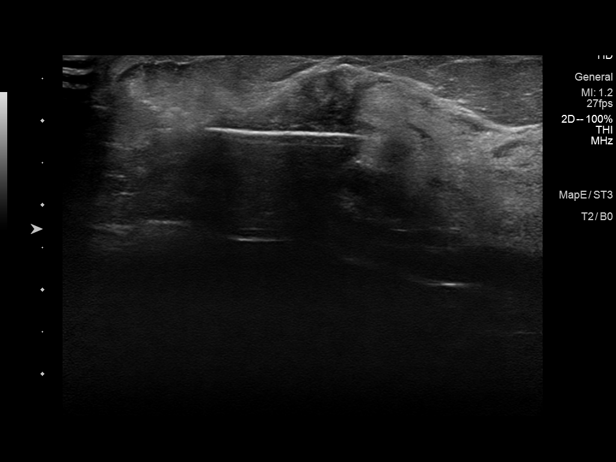
[im 18/44]
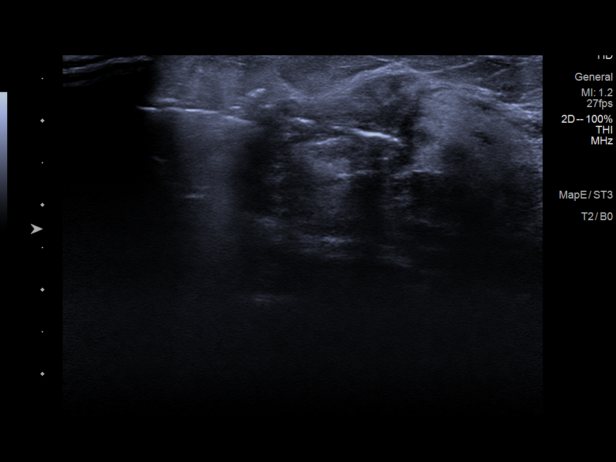
[im 20/44]
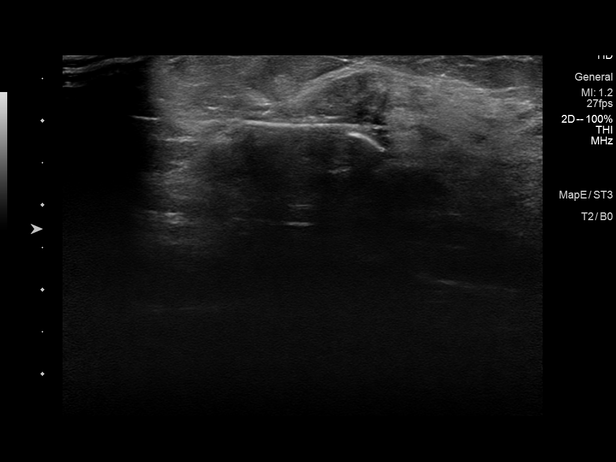
[im 24/44]
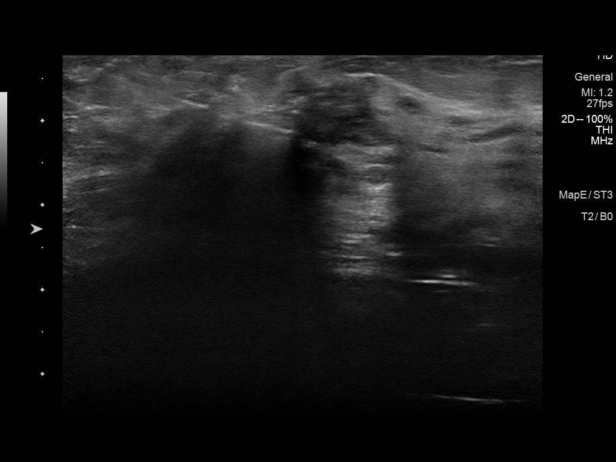
[im 26/44]
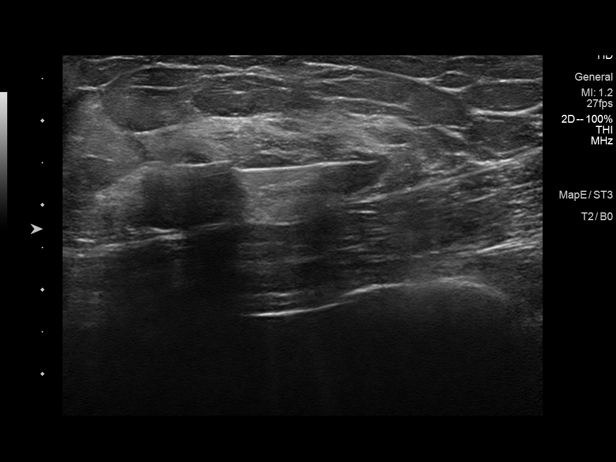
[im 29/44]
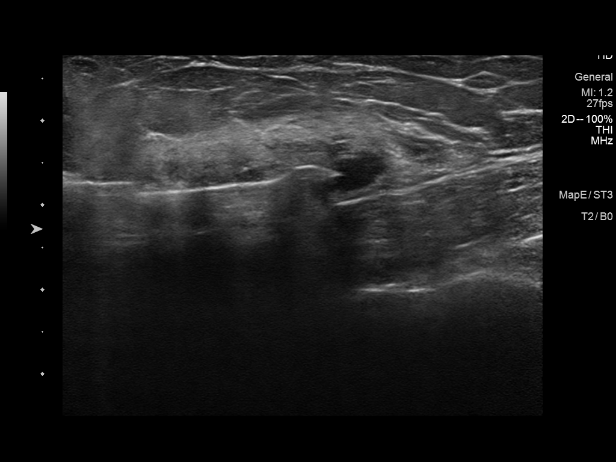
[im 31/44]
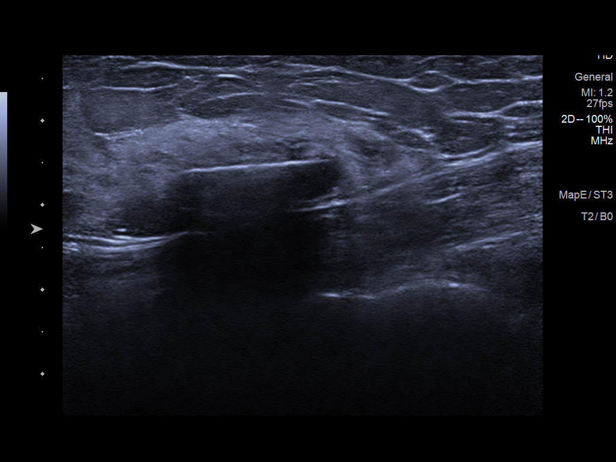
[im 35/44]
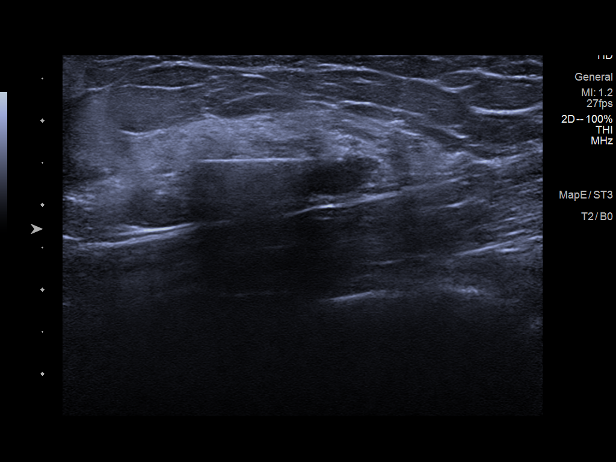
[im 38/44]
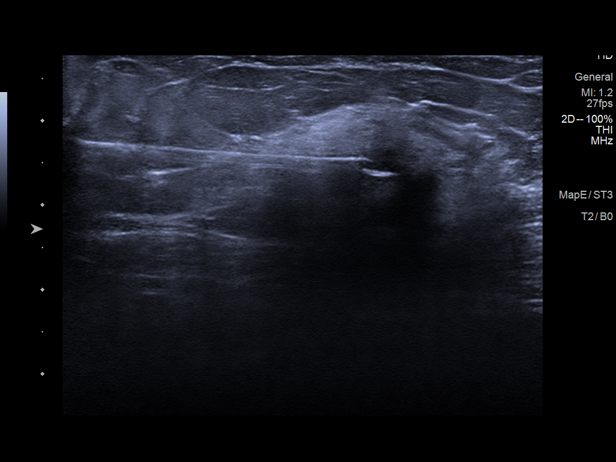
[im 40/44]
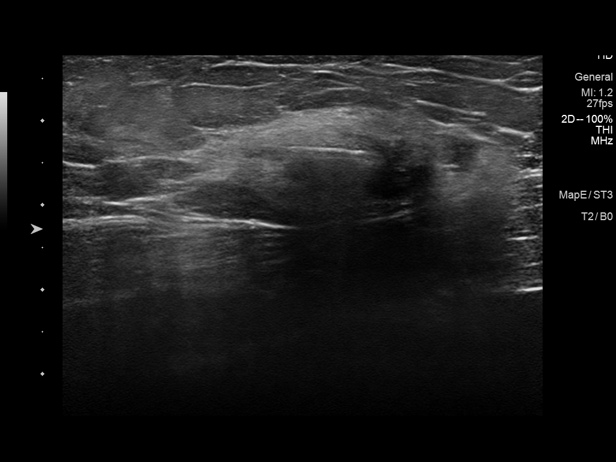
[im 44/44]
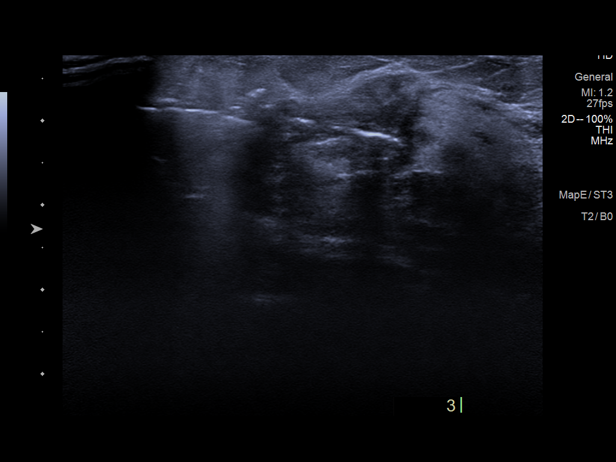

[16 of 25 positions shown; findings below may reference images not displayed]



SITE 1: RIGHT BREAST 10 O'CLOCK MASS: Lesion quadrant: UPPER OUTER

Using sterile technique and 1% Lidocaine as local anesthetic, under
direct ultrasound visualization, a 14 gauge GEORG device was
used to perform biopsy of the mass in the right breast at the 10
o'clock position using a lateral to medial approach. At the
conclusion of the procedure a ribbon shaped tissue marker clip was
deployed into the biopsy cavity. Follow up 2 view mammogram was
performed and dictated separately.

SITE 2: RIGHT BREAST [DATE] POSITION MASS: Lesion quadrant: UPPER
INNER

Using sterile technique and 1% Lidocaine as local anesthetic, under
direct ultrasound visualization, a 14 gauge GEORG device was
used to perform biopsy of the mass in the right breast at the [DATE]
position using a lateral to medial approach. At the conclusion of
the procedure a wing shaped tissue marker clip was deployed into the
biopsy cavity. Follow up 2 view mammogram was performed and dictated
separately.
IMPRESSION: 1. Ultrasound-guided biopsy of the mass in the right breast at the
10 o'clock position, at site of ribbon shaped biopsy marking clip.

2. Ultrasound-guided biopsy of the mass in the right breast at the
[DATE] position, at site of wing shaped biopsy marking clip.

ADDENDUM:
PATHOLOGY revealed: Site A. BREAST, [DATE], RIGHT, BIOPSY: - Invasive
mammary carcinoma, Comment: Measures 0.8 cm in greatest linear
dimension and appears grade 2-3.

Pathology results are CONCORDANT with imaging findings, per Dr.
GEORG.

PATHOLOGY revealed: Site B. BREAST, [DATE]. RIGHT, BIOPSY: -
Fibrocystic change with focal fibroadenomatoid change - Negative for
carcinoma.

Pathology results are CONCORDANT with imaging findings, per Dr.
GEORG.

Pathology results and recommendations below were discussed with
patient via pacific Interpreter by GEORG RN on [DATE].
Patient reported biopsy site within normal limits with slight
tenderness at the site. Post biopsy care instructions were reviewed,
questions were answered and my direct phone number was provided to
patient. Patient was instructed to call [HOSPITAL] [REDACTED] Mammography Department if any concerns or questions arise
related to the biopsy.

RECOMMENDATION: Surgical consultation for Site A only. Request for
surgical consultation was relayed to GEORG RT at [HOSPITAL] [HOSPITAL] Mammography Department by GEORG RN on
[DATE].

Pathology results reported by GEORG RN on [DATE].



SITE 1: RIGHT BREAST 10 O'CLOCK MASS: Lesion quadrant: UPPER OUTER

Using sterile technique and 1% Lidocaine as local anesthetic, under
direct ultrasound visualization, a 14 gauge GEORG device was
used to perform biopsy of the mass in the right breast at the 10
o'clock position using a lateral to medial approach. At the
conclusion of the procedure a ribbon shaped tissue marker clip was
deployed into the biopsy cavity. Follow up 2 view mammogram was
performed and dictated separately.

SITE 2: RIGHT BREAST [DATE] POSITION MASS: Lesion quadrant: UPPER
INNER

Using sterile technique and 1% Lidocaine as local anesthetic, under
direct ultrasound visualization, a 14 gauge GEORG device was
used to perform biopsy of the mass in the right breast at the [DATE]
position using a lateral to medial approach. At the conclusion of
the procedure a wing shaped tissue marker clip was deployed into the
biopsy cavity. Follow up 2 view mammogram was performed and dictated
separately.
IMPRESSION: 1. Ultrasound-guided biopsy of the mass in the right breast at the
10 o'clock position, at site of ribbon shaped biopsy marking clip.

2. Ultrasound-guided biopsy of the mass in the right breast at the
[DATE] position, at site of wing shaped biopsy marking clip.

## 2021-05-16 IMAGING — MG MM BREAST LOCALIZATION CLIP
6 series · 6 of 18 positions shown · non-contrast
Comparison: Previous exam(s).

CLINICAL DATA: Post ultrasound-guided biopsy a mass in the right
breast at the 10 o'clock position and ultrasound-guided biopsy of a
mass in the right breast at the [DATE] position.

EXAM:
3D DIAGNOSTIC RIGHT MAMMOGRAM POST ULTRASOUND BIOPSY

[R ML synth-2D]
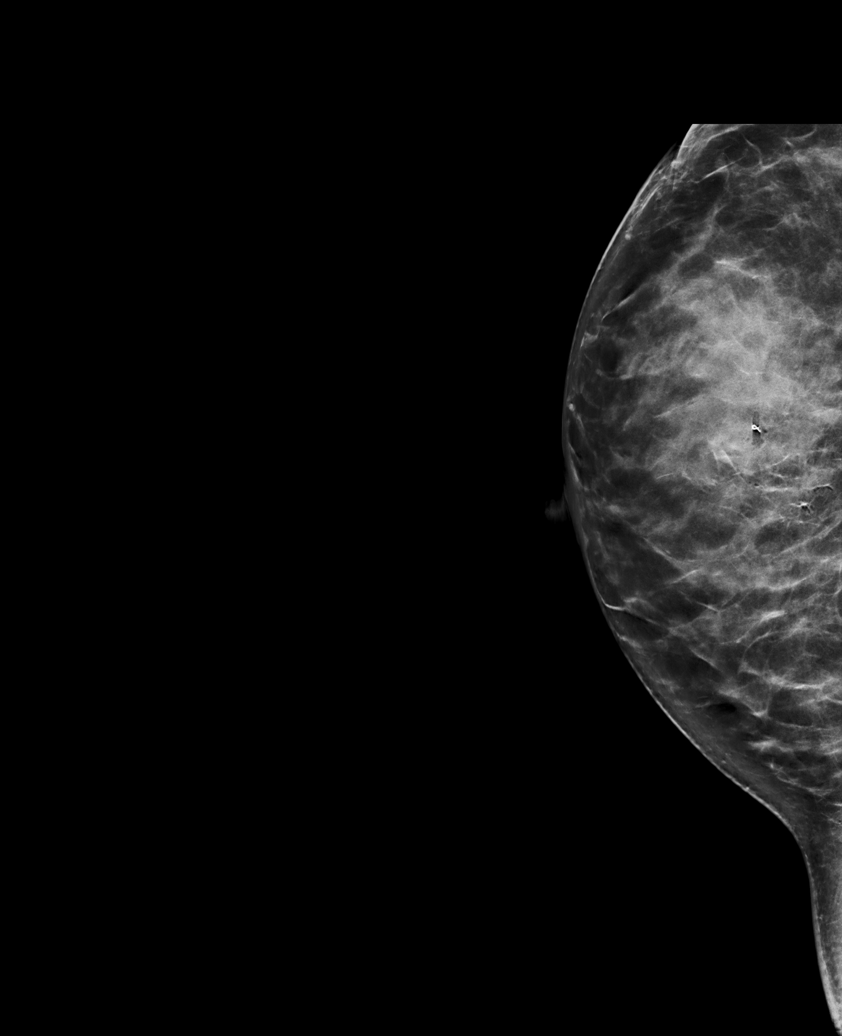

[R CC synth-2D]
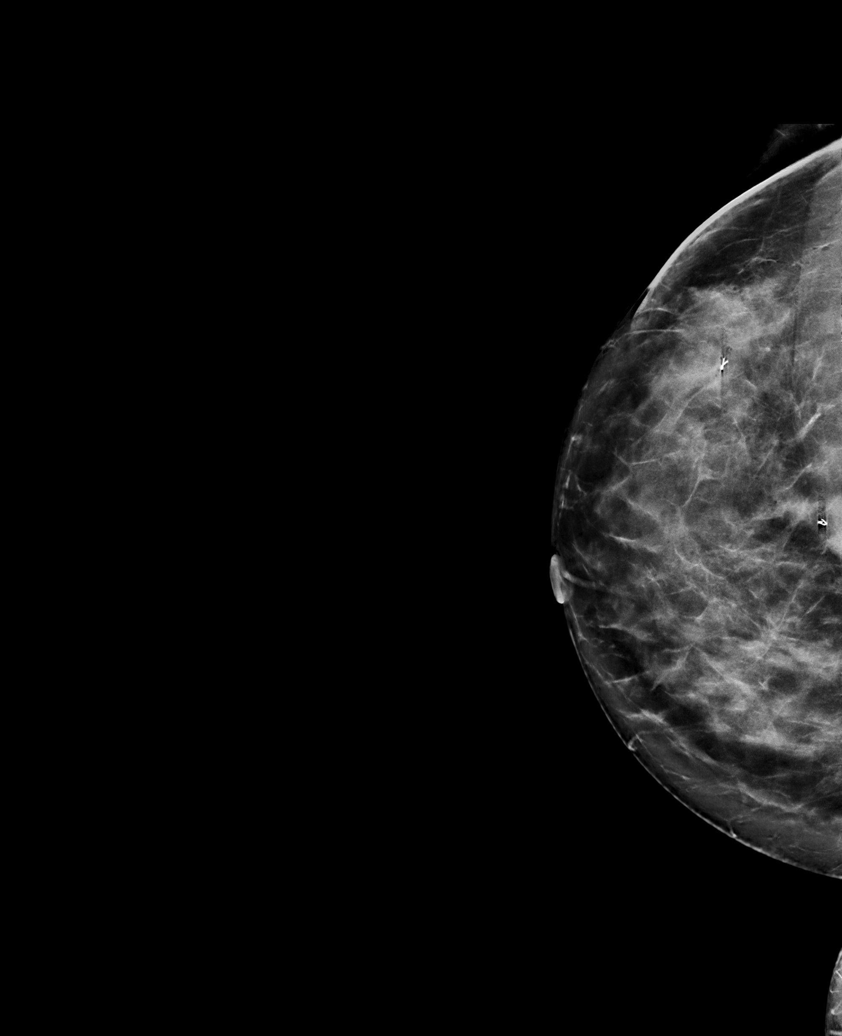

[R MLO synth-2D]
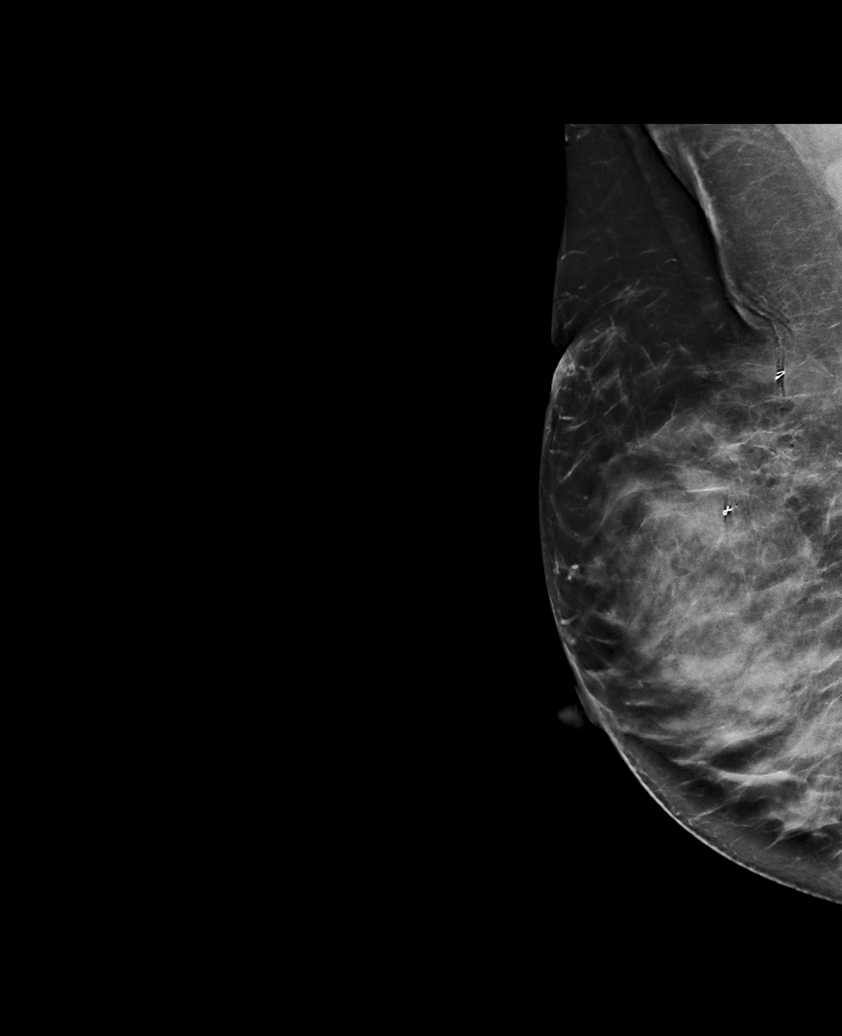

[R CC tomo · tomo slice 47/93.0]
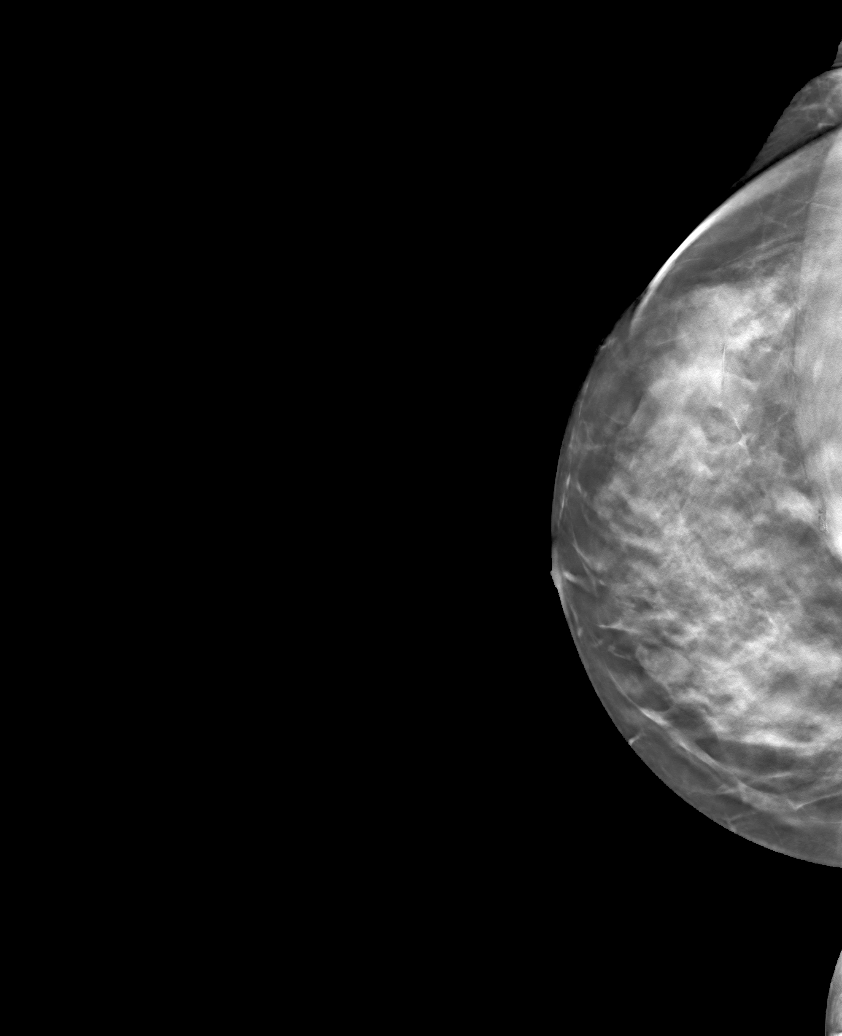

[R ML tomo · tomo slice 42/83.0]
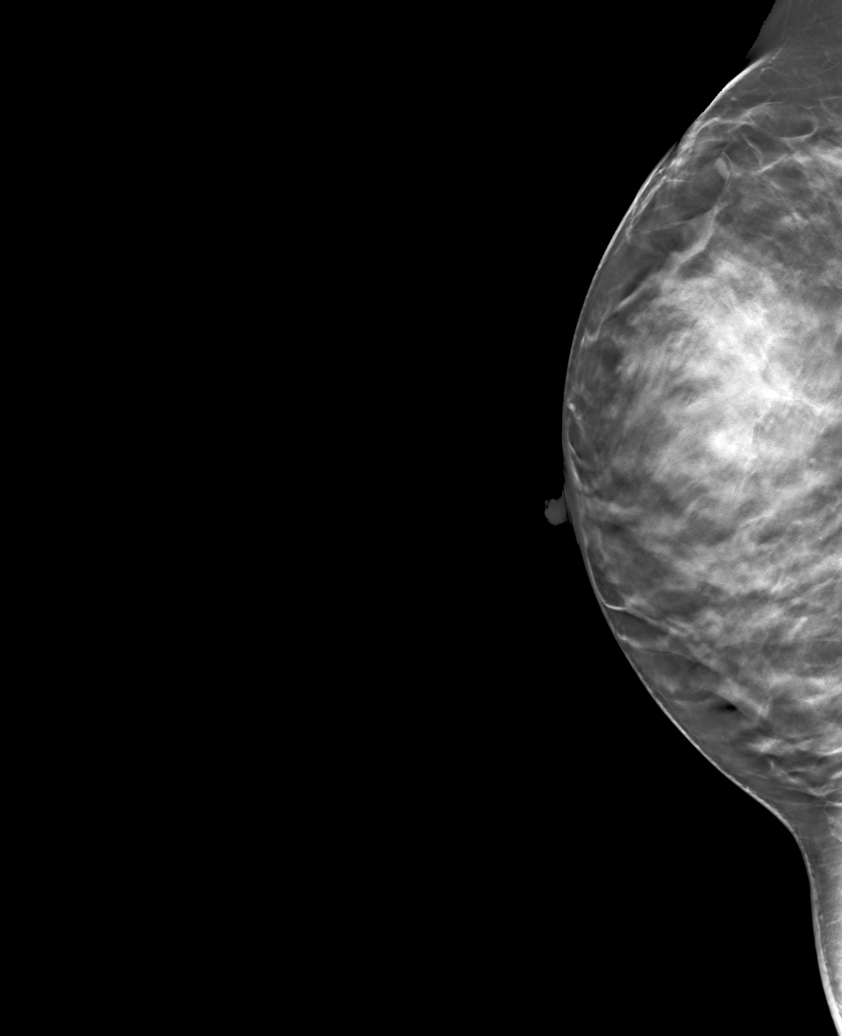

[R MLO tomo · tomo slice 43/85.0]
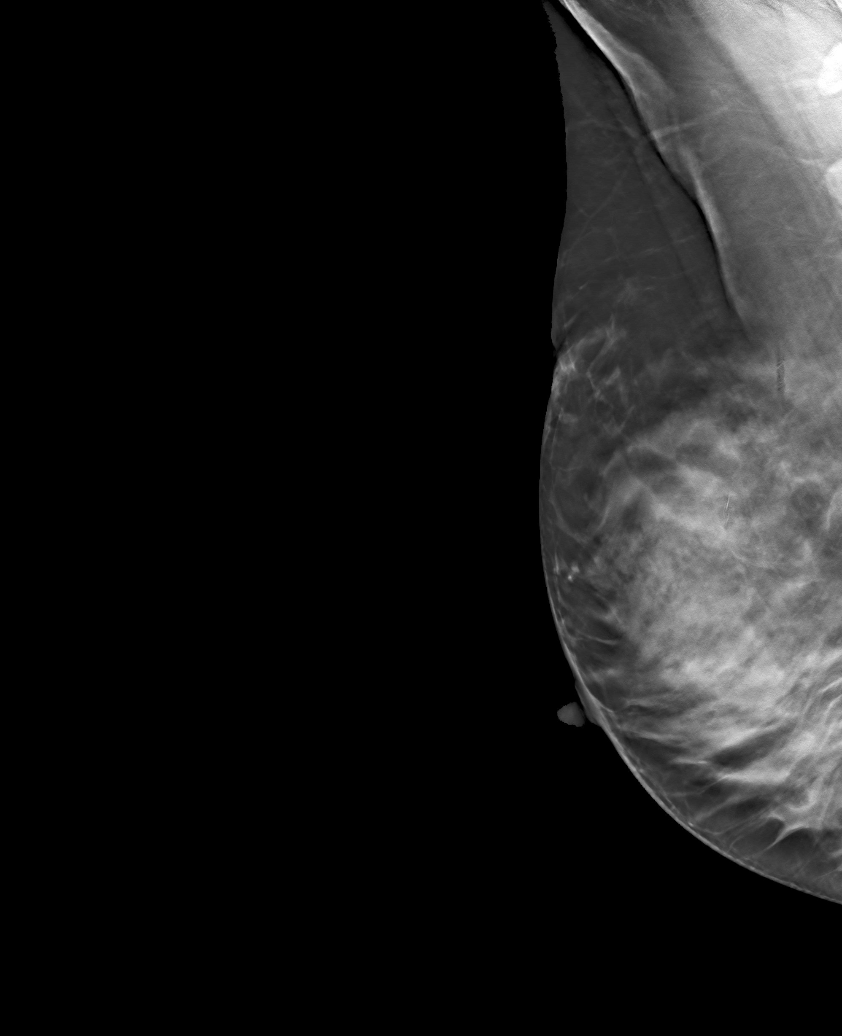

[6 of 18 positions shown; findings below may reference images not displayed]

FINDINGS: 3D Mammographic images were obtained following ultrasound guided
biopsy of a mass in the right breast at the 10 o'clock position as
well as ultrasound-guided biopsy of a mass in the right breast the
[DATE] position. A ribbon shaped biopsy marking clip is present at
the site of the biopsied mass in the right breast the 10 o'clock
position. A wing shaped biopsy marking clip is present at the site
of the biopsied mass in the right breast at the [DATE] position.
IMPRESSION: 1. Ribbon shaped biopsy marking clip at site of biopsied mass in the
right breast the 10 o'clock position.

2. Wing shaped biopsy marking clip at site of biopsied mass in the
right breast at [DATE] position.

Final Assessment: Post Procedure Mammograms for Marker Placement

## 2021-05-16 MED ORDER — LIDOCAINE HCL (PF) 2 % IJ SOLN
INTRAMUSCULAR | Status: AC
  Filled 2021-05-16: qty 20

## 2021-05-16 MED ORDER — LIDOCAINE-EPINEPHRINE (PF) 1 %-1:200000 IJ SOLN
INTRAMUSCULAR | Status: AC
  Filled 2021-05-16: qty 30

## 2021-05-22 LAB — SURGICAL PATHOLOGY

## 2021-05-24 ENCOUNTER — Other Ambulatory Visit (HOSPITAL_COMMUNITY): Payer: Self-pay | Admitting: General Surgery

## 2021-05-24 ENCOUNTER — Other Ambulatory Visit: Payer: Self-pay | Admitting: General Surgery

## 2021-05-24 DIAGNOSIS — Z170 Estrogen receptor positive status [ER+]: Secondary | ICD-10-CM

## 2021-06-04 ENCOUNTER — Telehealth: Payer: Self-pay | Admitting: Hematology and Oncology

## 2021-06-04 NOTE — Telephone Encounter (Signed)
Scheduled appt per 4/11 referral. Pt is aware of appt date and time. Pt is aware to arrive 15 mins prior to appt time and to bring and updated insurance card. Pt is aware of appt location.   ?

## 2021-06-08 ENCOUNTER — Ambulatory Visit (HOSPITAL_COMMUNITY): Admission: RE | Admit: 2021-06-08 | Payer: Self-pay | Source: Ambulatory Visit

## 2021-06-08 ENCOUNTER — Encounter (HOSPITAL_COMMUNITY): Payer: Self-pay

## 2021-06-11 ENCOUNTER — Encounter: Payer: Self-pay | Admitting: Plastic Surgery

## 2021-06-11 ENCOUNTER — Ambulatory Visit (INDEPENDENT_AMBULATORY_CARE_PROVIDER_SITE_OTHER): Payer: Self-pay | Admitting: Plastic Surgery

## 2021-06-11 VITALS — BP 119/71 | HR 79 | Ht 66.0 in | Wt 146.6 lb

## 2021-06-11 DIAGNOSIS — C50411 Malignant neoplasm of upper-outer quadrant of right female breast: Secondary | ICD-10-CM

## 2021-06-11 NOTE — Therapy (Signed)
?OUTPATIENT PHYSICAL THERAPY BREAST CANCER BASELINE EVALUATION ? ? ?Patient Name: Hannah Murray ?MRN: [id number] ?DOB:12/26/1977, 46 y.o., female ?Today's Date: 06/12/2021 ? ? PT End of Session - 06/12/21 1450   ? ? Visit Number 1   ? Number of Visits 2   ? Date for PT Re-Evaluation 08/07/21   ? PT Start Time 1451   ? PT Stop Time 1550   ? PT Time Calculation (min) 59 min   ? Activity Tolerance Patient tolerated treatment well   ? Behavior During Therapy [facility] for tasks assessed/performed   ? ?  ?  ? ?  ? ? ?History reviewed. No pertinent past medical history. ?History reviewed. No pertinent surgical history. ?Patient Active Problem List  ? Diagnosis Date Noted  ? 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 ? ?PCP: Pcp, No ? ?REFERRING PROVIDER: Jovita Kussmaul, MD ? ?REFERRING DIAG: Right Breast Cancer ? ?THERAPY DIAG:  ?Malignant neoplasm of upper-outer quadrant of right female breast, unspecified estrogen receptor status ([place]) ? ?Abnormal posture ? ?ONSET DATE: 04/25/2021 ? ?SUBJECTIVE                                                                                                                                                                                          ? ?SUBJECTIVE STATEMENT: ?Patient reports she is here today to be seen by her medical team for her newly diagnosed right breast cancer. Interpreted by husband until interpreter arrived. ? ?PERTINENT HISTORY:  ?Patient was diagnosed on 05/21/2021 with right grade 2-3. It measures .8 cm and is located in the upper-outer quadrant.  Surgery date is not yet known. She is pending an MRI on 06/15/2021 ?PATIENT GOALS   reduce lymphedema risk and learn post op HEP.  ? ?PAIN:  ?Are you having pain? Yes: NPRS scale: 5/10 ?Pain location: right elbow ?Pain description: not sure, can't understand ?Aggravating factors: not sure ?Relieving factors: doesn't take anything for it. Thinks it is from the cash register ? ? ?PRECAUTIONS:  Active CA Other:   ? ?HAND DOMINANCE: right ? ?WEIGHT BEARING RESTRICTIONS No ? ?FALLS:  ?Has patient fallen in last 6 months? No ? ?LIVING ENVIRONMENT: ?Patient lives with: husband and daughter ?Lives in: Mobile home ?Has following equipment at home: None ? ?OCCUPATION: cash register ? ?LEISURE: nothing ? ?PRIOR LEVEL OF FUNCTION: Independent ? ? ?OBJECTIVE ? ?COGNITION: ? Overall cognitive status: Within functional limits for tasks assessed   ? ?POSTURE:  ?Forward head and rounded shoulders posture ? ?UPPER EXTREMITY AROM/PROM: ? ?A/PROM RIGHT   ?06/12/2021  ?Shoulder extension 53  ?Shoulder flexion 167  ?Shoulder  abduction 180  ?Shoulder internal rotation 70  ?Shoulder external rotation 102  ?  (Blank rows = not tested) ? ?A/PROM LEFT   ?06/12/2021  ?Shoulder extension 65  ?Shoulder flexion 165  ?Shoulder abduction 180  ?Shoulder internal rotation 65  ?Shoulder external rotation 104  ?  (Blank rows = not tested) ? ? ?CERVICAL AROM: ?All within functional limits; Decreased 40% for bilateral rotation, 25% for bilateral SB   ? ? Percent limited  ?Flexion   ?Extension   ?Right lateral flexion   ?Left lateral flexion   ?Right rotation   ?Left rotation   ? ? ? ?UPPER EXTREMITY STRENGTH: WNL ? ? ?LYMPHEDEMA ASSESSMENTS:  ? ?LANDMARK RIGHT  06/12/2021  ?10 cm proximal to olecranon process 29.1  ?Olecranon process 24.1  ?10 cm proximal to ulnar styloid process 21.1  ?Just proximal to ulnar styloid process 14.8  ?Across hand at thumb web space 20.1  ?At base of 2nd digit 6.1  ?(Blank rows = not tested) ? ?[place] LEFT  06/12/2021  ?10 cm proximal to olecranon process 29.3  ?Olecranon process 23.9  ?10 cm proximal to ulnar styloid process 20.0  ?Just proximal to ulnar styloid process 14.8  ?Across hand at thumb web space 19.3  ?At base of 2nd digit 5.8  ?(Blank rows = not tested) ? ? ?L-DEX LYMPHEDEMA SCREENING: ? ?The patient was assessed using the L-Dex machine today to produce a lymphedema index baseline score. The patient  will be reassessed on a regular basis (typically every 3 months) to obtain new L-Dex scores. If the score is > 6.5 points away from his/her baseline score indicating onset of subclinical lymphedema, it will be recommended to wear a compression garment for 4 weeks, 12 hours per day and then be reassessed. If the score continues to be > 6.5 points from baseline at reassessment, we will initiate lymphedema treatment. Assessing in this manner has a 95% rate of preventing clinically significant lymphedema. ? ? L-DEX FLOWSHEETS - 06/12/21 1500   ? ?  ? L-DEX LYMPHEDEMA SCREENING  ? Measurement Type Unilateral   ? L-DEX MEASUREMENT EXTREMITY Upper Extremity   ? POSITION  Standing   ? DOMINANT SIDE Right   ? At Risk Side Right   ? BASELINE SCORE (UNILATERAL) -1.7   ? ?  ?  ? ?  ? ? ? ?QUICK DASH SURVEY:18% ? ? ?PATIENT EDUCATION:  ?Education details: Lymphedema risk reduction and post op shoulder/posture HEP ?Person educated: Patient ?Education method: Explanation, Demonstration, Handout ?Education comprehension: Patient verbalized understanding and returned demonstration ? ? ?HOME EXERCISE PROGRAM: ?Patient was instructed today in a home exercise program today for post op shoulder range of motion. These included active assist shoulder flexion in sitting/supine, scapular retraction, wall walking with shoulder abduction, and hands behind head external rotation in sitting/supine.  She was encouraged to do these twice a day, holding 3 seconds and repeating 5 times when permitted by her physician. ? ? ?ASSESSMENT: ? ?CLINICAL IMPRESSION: ?Her multidisciplinary medical team met prior to her assessments to determine a recommended treatment plan. She is unsure right now what type of surgery she will have,. She will benefit from a post op PT reassessment to determine needs and from L-Dex screens every 3 months for 2 years to detect subclinical lymphedema. ? ?Pt will benefit from skilled therapeutic intervention to improve on the  following deficits: Decreased knowledge of precautions, impaired UE functional use, pain, decreased ROM, postural dysfunction.  ? ?PT treatment/interventions: ADL/self-care home management, pt/family education,  therapeutic exercise ? ?REHAB POTENTIAL: Excellent ? ?CLINICAL DECISION MAKING: Stable/uncomplicated ? ?EVALUATION COMPLEXITY: Low ? ? ?GOALS: ?Goals reviewed with patient? YES ? ?LONG TERM GOALS: (STG=LTG) ? ? Name Target Date Goal status  ?1 Pt will be able to verbalize understanding of pertinent lymphedema risk reduction practices relevant to her dx specifically related to skin care.  ?Baseline:  No knowledge 06/12/2021 Achieved at eval  ?2 Pt will be able to return demo and/or verbalize understanding of the post op HEP related to regaining shoulder ROM. ?Baseline:  No knowledge 06/12/2021 Achieved at eval  ?3 Pt will be able to verbalize understanding of the importance of attending the post op After Breast CA Class for further lymphedema risk reduction education and therapeutic exercise.  ?Baseline:  No knowledge 06/12/2021 Achieved at eval  ?4 Pt will demo she has regained full shoulder ROM and function post operatively compared to baselines.  ?Baseline: See objective measurements taken today. 08/07/2021 ? Initial  ? ? ? ?PLAN: ?PT FREQUENCY/DURATION: EVAL and 1 follow up appointment.  ? ?PLAN FOR NEXT SESSION: will reassess 3-4 weeks post op to determine needs. ?  ?Patient will follow up at outpatient cancer rehab 3-4 weeks following surgery.  If the patient requires physical therapy at that time, a specific plan will be dictated and sent to the referring physician for approval. ?The patient was educated today on appropriate basic range of motion exercises to begin post operatively and the importance of attending the After Breast Cancer class following surgery.  Patient was educated today on lymphedema risk reduction practices as it pertains to recommendations that will benefit the patient immediately  following surgery.  She verbalized good understanding.   ? ?Physical Therapy Information for After Breast Cancer Surgery/Treatment: ? ?Lymphedema is a swelling condition that you may be at risk for in your a

## 2021-06-11 NOTE — Progress Notes (Incomplete)
?Radiation Oncology         [phone] ?________________________________ ? ?Name: Hannah Murray        MRN: [id number]  ?Date of Service: 06/13/2021 DOB: 12/26/1977 ? ?CC:Pcp, No  Jovita Kussmaul, MD    ? ?REFERRING PHYSICIAN: Jovita Kussmaul, MD ? ? ?DIAGNOSIS: There were no encounter diagnoses. ? ? ?HISTORY OF PRESENT ILLNESS: Hannah Murray is a 46 y.o. female seen at the request of Dr. Marlou Starks for a new diagnosis of *** breast cancer. The patient presented for a evaluation of a palpable lump in the upper right breast, and diagnostic imaging identified a broad area of distortion in the upper outer quadrant.  By ultrasound at 10:00 there was an ill-defined mass measuring at least 3.6 cm, and an associated mass at the 12:30 position also showed another mass measuring up to 1 cm.  Her axilla was negative for adenopathy.  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  The biopsy at 1230 showed fibrocystic change with focal fibroadenomatoid change negative for carcinoma.  She is seen to discuss treatment recommendations of her cancer.  Dr. Marlou Starks has also recommended an MRI of the breast for extent of disease which is scheduled for 06/15/2021. ? ? ? ?PREVIOUS RADIATION THERAPY: {EXAM; YES/NO:19492::"No"} ? ? ?PAST MEDICAL HISTORY: No past medical history on file.   ? ? ?PAST SURGICAL HISTORY:*** The histories are not reviewed yet. Please review them in the "History" navigator section and refresh this [place]. ? ? ?FAMILY HISTORY:  ?Family History  ?Problem Relation Age of Onset  ? Hypertension Father   ? Diabetes Father   ? Thyroid disease Father   ? ? ? ?SOCIAL HISTORY:  reports that she has never smoked. She has never used smokeless tobacco. She reports current alcohol use. She reports that she does not use drugs. The patient is married and lives in [place]. She  *** ? ? ?ALLERGIES: Patient has no allergy information on record. ? ? ?MEDICATIONS:  ?Current Outpatient Medications  ?Medication Sig Dispense Refill  ? Multiple Vitamin (MULTIVITAMIN) capsule Take 1 capsule by mouth daily.    ? ?No current facility-administered medications for this visit.  ? ? ? ?REVIEW OF SYSTEMS: On review of systems, the patient reports that she is doing *** ? ?  ? ?PHYSICAL EXAM:  ?Wt Readings from Last 3 Encounters:  ?04/30/21 148 lb 3.2 oz (67.2 kg)  ? ?Temp Readings from Last 3 Encounters:  ?No data found for Temp  ? ?BP Readings from Last 3 Encounters:  ?04/30/21 126/76  ? ?Pulse Readings from Last 3 Encounters:  ?No data found for Pulse  ? ? ?In general this is a well appearing *** female in no acute distress. She's alert and oriented x4 and appropriate throughout the examination. Cardiopulmonary assessment is negative for acute distress and she exhibits normal effort. Bilateral breast exam is deferred. ? ? ? ?ECOG = *** ? ?0 - Asymptomatic (Fully active, able to carry on all predisease activities without restriction) ? ?1 - Symptomatic but completely ambulatory (Restricted in physically strenuous activity but ambulatory and able to carry out work of a light or sedentary nature. For example, light housework, office work) ? ?2 - Symptomatic, <50% in bed during the day (Ambulatory and capable of all self care but unable to carry out any work activities. Up and about more  than 50% of waking hours) ? ?3 - Symptomatic, >50% in bed, but not bedbound (Capable of only limited self-care, confined to bed or chair 50% or more of waking hours) ? ?4 - Bedbound (Completely disabled. Cannot carry on any self-care. Totally confined to bed or chair) ? ?5 - Death ? ? Oken MM, Creech RH, Tormey DC, et al. [phone]). "Toxicity and response criteria of the [facility]". [place] Oncol. 5 (6): 649-55 ? ? ? ?LABORATORY DATA:  ?No results found for: WBC, HGB, HCT, MCV, PLT ?No results  found for: NA, K, CL, CO2 ?No results found for: ALT, AST, GGT, ALKPHOS, BILITOT ?  ? ?RADIOGRAPHY: MM CLIP PLACEMENT RIGHT ? ?Result Date: 05/16/2021 ?CLINICAL DATA:  Post ultrasound-guided biopsy a mass in the right breast at the 10 o'clock position and ultrasound-guided biopsy of a mass in the right breast at the 12:30 position. EXAM: 3D DIAGNOSTIC RIGHT MAMMOGRAM POST ULTRASOUND BIOPSY COMPARISON:  Previous exam(s). FINDINGS: 3D Mammographic images were obtained following ultrasound guided biopsy of a mass in the right breast at the 10 o'clock position as well as ultrasound-guided biopsy of a mass in the right breast the 12:30 position. A ribbon shaped biopsy marking clip is present at the site of the biopsied mass in the right breast the 10 o'clock position. A wing shaped biopsy marking clip is present at the site of the biopsied mass in the right breast at the 12:30 position. IMPRESSION: 1. Ribbon shaped biopsy marking clip at site of biopsied mass in the right breast the 10 o'clock position. 2. Wing shaped biopsy marking clip at site of biopsied mass in the right breast at 12:30 position. Final Assessment: Post Procedure Mammograms for Marker Placement Electronically Signed   By: Everlean Alstrom M.D.   On: 05/16/2021 08:55 ? ?[place] RT BREAST BX W LOC DEV 1ST LESION IMG BX SPEC US GUIDE ? ?Addendum Date: 05/21/2021   ?ADDENDUM REPORT: 05/21/2021 07:33 ADDENDUM: PATHOLOGY revealed: Site A. BREAST, 10:00, RIGHT, BIOPSY: - Invasive mammary carcinoma, Comment: Measures 0.8 cm in greatest linear dimension and appears grade 2-3. Pathology results are CONCORDANT with imaging findings, per Dr. Everlean Alstrom. PATHOLOGY revealed: Site B. BREAST, 12:30. RIGHT, BIOPSY: - Fibrocystic change with focal fibroadenomatoid change - Negative for carcinoma. Pathology results are CONCORDANT with imaging findings, per Dr. Everlean Alstrom. Pathology results and recommendations below were discussed with patient via [employer]  by Stacie Acres RN on 05/17/2021. Patient reported biopsy site within normal limits with slight tenderness at the site. Post biopsy care instructions were reviewed, questions were answered and my direct phone number was provided to patient. Patient was instructed to call [facility] Mammography Department if any concerns or questions arise related to the biopsy. RECOMMENDATION: Surgical consultation for Site A only. Request for surgical consultation was relayed to Kathi Der RT at [facility] Mammography Department by Stacie Acres RN on 05/17/2021. Pathology results reported by Electa Sniff RN on 05/20/2021. Electronically Signed   By: Everlean Alstrom M.D.   On: 05/21/2021 07:33  ? ?Result Date: 05/21/2021 ?CLINICAL DATA:  46 year old female presents for ultrasound-guided core biopsy of an at least 3.6 cm mass in the right breast at the 10 o'clock position as well as an indeterminate 1 cm mass in the right breast at the 12:30 position. EXAM: ULTRASOUND GUIDED RIGHT BREAST CORE NEEDLE BIOPSY COMPARISON:  Previous exam(s). PROCEDURE: I met with the patient and we discussed the procedure of ultrasound-guided biopsy,  including benefits and alternatives. We discussed the high likelihood of a successful procedure. We discussed the risks of the procedure, including infection, bleeding, tissue injury, clip migration, and inadequate sampling. Informed written consent was given. The usual time-out protocol was performed immediately prior to the procedure. SITE 1: RIGHT BREAST 10 O'CLOCK MASS: Lesion quadrant: UPPER OUTER Using sterile technique and 1% Lidocaine as local anesthetic, under direct ultrasound visualization, a 14 gauge spring-loaded device was used to perform biopsy of the mass in the right breast at the 10 o'clock position using a lateral to medial approach. At the conclusion of the procedure a ribbon shaped tissue marker clip was deployed into the biopsy cavity. Follow up 2 view  mammogram was performed and dictated separately. SITE 2: RIGHT BREAST 12:30 POSITION MASS: Lesion quadrant: UPPER INNER Using sterile technique and 1% Lidocaine as local anesthetic, under direct ultrasound visualization

## 2021-06-11 NOTE — Progress Notes (Addendum)
? ?  Patient ID: Hannah Murray, female    DOB: 12/26/1977, 46 y.o.   MRN: [id number] ? ? ?Chief Complaint  ?Patient presents with  ? Consult  ? ? ?The patient is a 46 year old female here for consultation for breast reconstruction.  She has a new diagnosis of right lateral breast cancer.  This was found on a routine screening mammogram.  At the time it was 3.6 cm in the upper outer quadrant of the right breast and biopsy returned as invasive ductal carcinoma.  It is estrogen and progesterone positive and HER2 negative with a Ki-67 of 40%.  So far the lymph nodes look normal.  She is in good health and not a smoker.  She is 5 feet 6 inches tall and weighs 146 pounds.  Her preoperative bra size is a D.  She would like to be the same size if possible.  She is scheduled for a MRI this coming Saturday.  Depending on the results will determine if she goes for a partial mastectomy or a full right sided mastectomy. She is not a smoker. ? ? ? ? ? ?Review of Systems  ?Constitutional: Negative.   ?HENT: Negative.    ?Eyes: Negative.   ?Respiratory: Negative.  Negative for chest tightness and shortness of breath.   ?Cardiovascular: Negative.  Negative for leg swelling.  ?Gastrointestinal: Negative.   ?Endocrine: Negative.   ?Genitourinary: Negative.   ?Musculoskeletal: Negative.   ?Skin: Negative.   ?Neurological: Negative.   ?Hematological: Negative.   ?Psychiatric/Behavioral: Negative.    ? ?History reviewed. No pertinent past medical history.  ?History reviewed. No pertinent surgical history.  ? ?No current outpatient medications on file.  ? ?Objective:  ? ?Vitals:  ? 06/11/21 1436  ?BP: 119/71  ?Pulse: 79  ?SpO2: 98%  ? ? ?Physical Exam ?Vitals reviewed.  ?Constitutional:   ?   Appearance: Normal appearance.  ?HENT:  ?   Head: Normocephalic and atraumatic.  ?Cardiovascular:  ?   Rate and Rhythm: Normal rate.  ?   Pulses: Normal pulses.  ?Pulmonary:  ?   Effort: Pulmonary effort is normal.  ?Abdominal:  ?   General: There is  no distension.  ?   Palpations: Abdomen is soft.  ?   Tenderness: There is no abdominal tenderness.  ?Musculoskeletal:     ?   General: No swelling or deformity.  ?Skin: ?   General: Skin is warm.  ?   Coloration: Skin is not jaundiced.  ?   Findings: No bruising.  ?Neurological:  ?   Mental Status: She is alert and oriented to person, place, and time.  ?Psychiatric:     ?   Mood and Affect: Mood normal.     ?   Behavior: Behavior normal.     ?   Thought Content: Thought content normal.     ?   Judgment: Judgment normal.  ? ? 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 ?The options for reconstruction we explained to the patient / family for breast reconstruction.  There are two general categories of reconstruction.  We can reconstruction a breast with implants or use the patient's own tissue.  These were further discussed as listed. ? ?Breast reconstruction is an optional procedure and eligibility depends on the full spectrum of the health of the patient and any co-morbidities.  More than one surgery is often needed to complete the reconstruction process.  The process can take three to  twelve months to complete.  The breasts will not be identical due to many factors such as rib differences, shoulder asymmetry and treatments such as radiation.  The goal is to get the breasts to look normal and symmetrical in clothes.  Scars are a part of surgery and may fade some in time but will always be present under clothes.  Surgery may be an option on the non-cancer breast to achieve more symmetry.  No matter which procedure is chosen there is always the risk of complications and even failure of the body to heal.  This could result in no breast.   ? ?The options for reconstruction include:  ?1. Placement of a tissue expander with Acellular dermal matrix. When the expander is the desired size surgery is performed to remove the expander and place an implant.  In some cases the  implant can be placed without an expander.  ?2. Autologous reconstruction and Combined procedures will be further discussed on the phone. ?  ?The risks, benefits, scars and recovery time were discussed for each of the above. Risks include bleeding, infection, hematoma, seroma, scarring, pain, wound healing complications, flap loss, fat necrosis, capsular contracture, need for implant removal, donor site complications, bulge, hernia, umbilical necrosis, need for urgent reoperation, and need for dressing changes.  ? ?The procedure the patient selected / that was best for the patient, was then discussed in further detail.  Total time: 45 minutes. This includes time spent with the patient during the visit as well as time spent before and after the visit reviewing the chart, documenting the encounter, making phone calls and reviewing studies.  ? ?We will plan on doing a telemetry visit for a week and a half when the results of the MRI should be back.  The patient would like to keep her nipple areola if possible.  I think that this should be okay.  So if she needs a mastectomy we will likely do implant-based reconstruction. ? ?Pictures were obtained of the patient and placed in the chart with the patient's or guardian's permission. ? ? ?Loel Lofty Ahnika Hannibal, DO ?

## 2021-06-12 ENCOUNTER — Ambulatory Visit: Payer: Self-pay | Attending: General Surgery

## 2021-06-12 DIAGNOSIS — Z170 Estrogen receptor positive status [ER+]: Secondary | ICD-10-CM | POA: Insufficient documentation

## 2021-06-12 DIAGNOSIS — R293 Abnormal posture: Secondary | ICD-10-CM | POA: Insufficient documentation

## 2021-06-12 DIAGNOSIS — C50411 Malignant neoplasm of upper-outer quadrant of right female breast: Secondary | ICD-10-CM | POA: Insufficient documentation

## 2021-06-12 NOTE — Progress Notes (Incomplete)
New Breast Cancer Diagnosis: Right Breast UOQ ? ?Did patient present with symptoms (if so, please note symptoms) or screening mammography?:Palpable mass   ? ?Location and Extent of disease :right breast. Located at 10 o'clock position there was a ill-defined mass measuring at least 3.6 cm, and an associated mass at the 12:30 position also showed another mass measuring up to 1 cm.  Adenopathy no. ? ?   ?MRI Breast 06/15/2021: ? ?Histology per Pathology Report: grade 2-3, Invasive Ductal Carcinoma 10 o'clock and 12:30 Position 05-16-2021 ? ? ?Receptor Status: ER(positive), PR (positive), Her2-neu (negative), Ki-(40%) ? ? ?Surgeon and surgical plan, if any:  ?Dr. Marlou Starks 05/22/2021 ?- MRI breast bilateral with and without contrast; Future ?- Ambulatory Referral to Oncology-Medical ?- Ambulatory Referral to Plastic Surgery ?- Ambulatory Referral to Physical Therapy ?- Ambulatory Referral to Radiation Oncology ?- [facility] Case Posting Request; Future ? ? ?Medical oncologist, treatment if any:   ? ? ?Plastic Surgery plan, if any: ?Dr. Marla Roe 06/11/2021 ?- She has a new diagnosis of right lateral breast cancer.  So far the lymph nodes look normal. ?-Depending on the results will determine if she goes for a partial mastectomy or a full right sided mastectomy. ?-We will plan on doing a telemetry visit for a week and a half when the results of the MRI should be back.  The patient would like to keep her nipple areola if possible.  I think that this should be okay.  So if she needs a mastectomy we will likely do implant-based reconstruction. ? ? ?Family History of Breast/Ovarian/Prostate Cancer:  ? ?Lymphedema issues, if any:     ? ?Pain issues, if any:    ? ?SAFETY ISSUES: ?Prior radiation?  ?Pacemaker/ICD?  ?Possible current pregnancy? Having Cycles ?Is the patient on methotrexate?  ? ?Current Complaints / other details:   ? ?

## 2021-06-13 ENCOUNTER — Telehealth: Payer: Self-pay | Admitting: Radiation Oncology

## 2021-06-13 ENCOUNTER — Ambulatory Visit: Payer: Self-pay

## 2021-06-13 ENCOUNTER — Ambulatory Visit: Payer: Self-pay | Admitting: Radiation Oncology

## 2021-06-13 DIAGNOSIS — C50411 Malignant neoplasm of upper-outer quadrant of right female breast: Secondary | ICD-10-CM

## 2021-06-13 NOTE — Telephone Encounter (Signed)
LMV to r/s missed CON appt ?

## 2021-06-15 ENCOUNTER — Ambulatory Visit (HOSPITAL_COMMUNITY)
Admission: RE | Admit: 2021-06-15 | Discharge: 2021-06-15 | Disposition: A | Discharge status: Home / Self Care | Payer: Self-pay | Source: Ambulatory Visit | Attending: General Surgery | Admitting: General Surgery

## 2021-06-15 DIAGNOSIS — Z170 Estrogen receptor positive status [ER+]: Secondary | ICD-10-CM | POA: Insufficient documentation

## 2021-06-15 DIAGNOSIS — C50411 Malignant neoplasm of upper-outer quadrant of right female breast: Secondary | ICD-10-CM | POA: Insufficient documentation

## 2021-06-15 IMAGING — MR MR BREAST BILAT WO/W CM
6 of 10 series · 28 of 48 positions shown · IV contrast (gadavist)
Comparison: Previous exam(s).

CLINICAL DATA: 45-year-old female with newly diagnosed UPPER OUTER
RIGHT breast invasive mammary carcinoma. Also with recent benign
UPPER INNER RIGHT breast biopsy.

EXAM:
BILATERAL BREAST MRI WITH AND WITHOUT CONTRAST
TECHNIQUE: Multiplanar, multisequence MR images of both breasts were obtained
prior to and following the intravenous administration of 6 ml of
Gadavist

[Series 2: T2 · axial · 3.0mm · 0.91mm/px · z∈[-66,+96]mm · 4 of 55 slices shown]
[im 1/55]
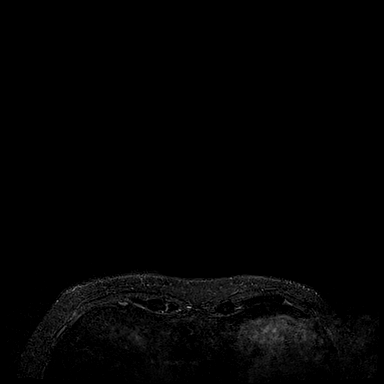
[im 19/55]
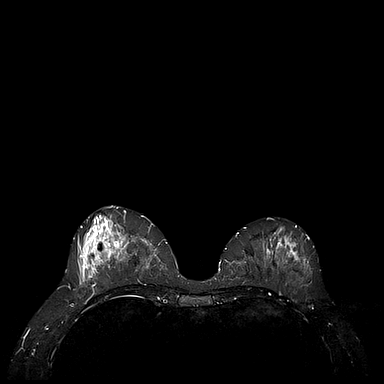
[im 37/55]
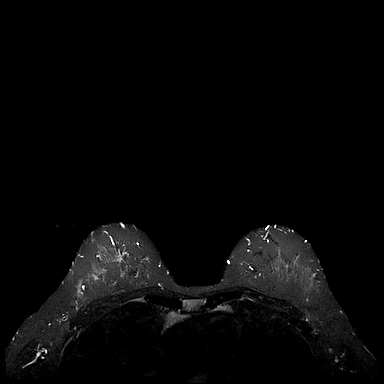
[im 55/55]
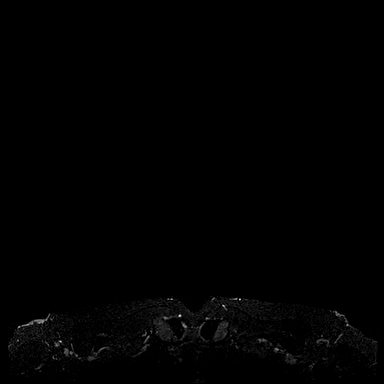

[Series 3: T1 fat-sat · axial · 1.2mm · 0.78mm/px · z∈[-71,+100]mm · 8 of 144 slices shown (1 of 4)]
[im 1/144]
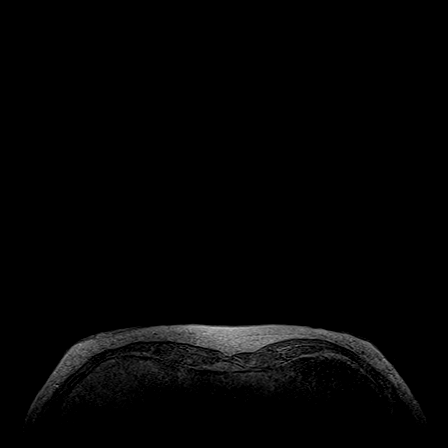
[im 21/144]
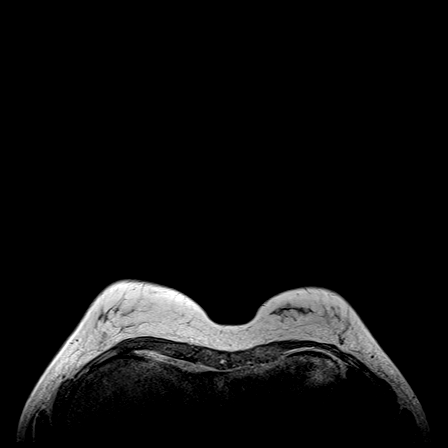
[im 41/144]
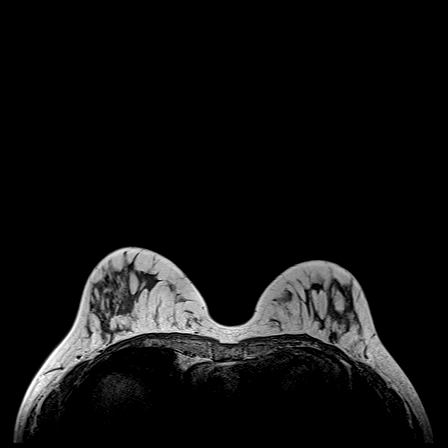
[im 62/144]
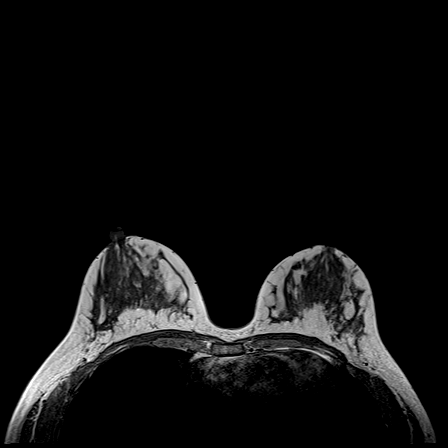
[im 82/144]
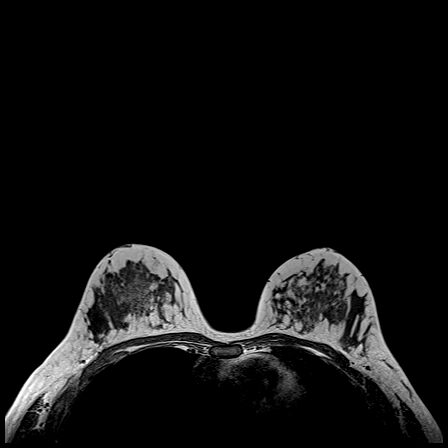
[im 103/144]
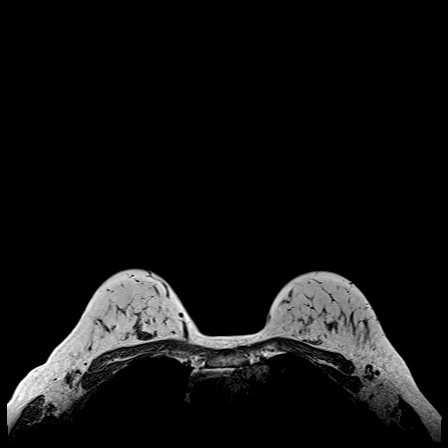
[im 123/144]
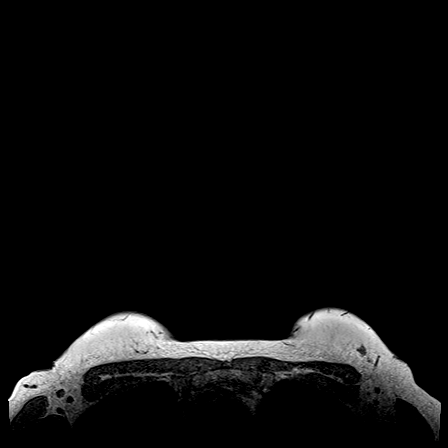
[im 144/144]
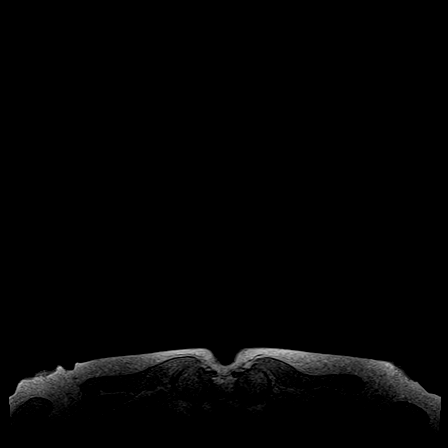

[Series 5: T1 fat-sat · axial · 1.4mm · 0.84mm/px · z∈[-63,+92]mm · 5 of 112 slices shown (2 of 4)]
[im 1/112]
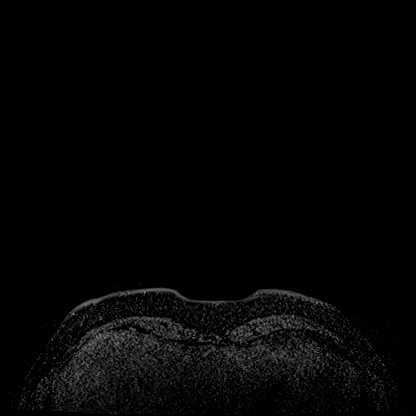
[im 28/112]
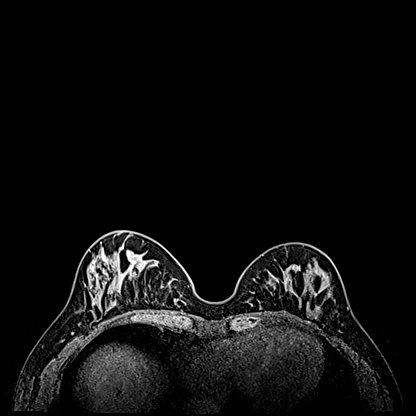
[im 56/112]
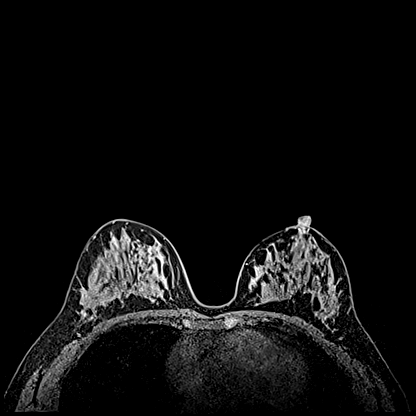
[im 84/112]
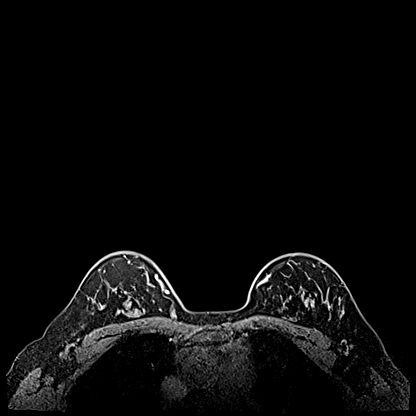
[im 112/112]
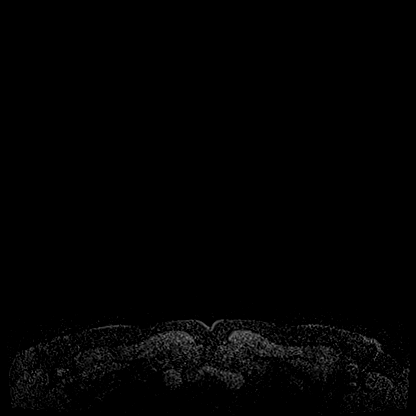

[Series 6: T1 fat-sat · axial · 1.4mm · 0.84mm/px · z∈[-63,+92]mm · 5 of 112 slices shown (3 of 4)]
[im 1/112]
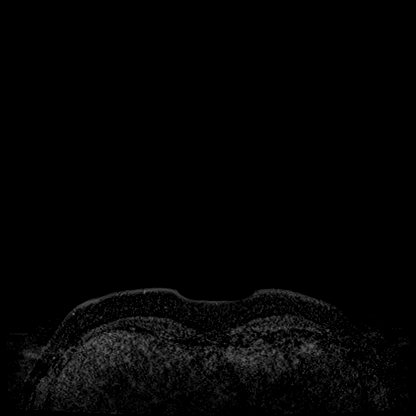
[im 28/112]
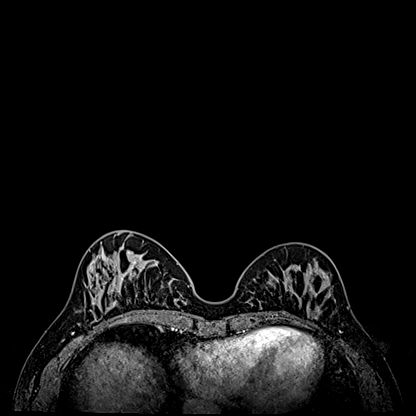
[im 56/112]
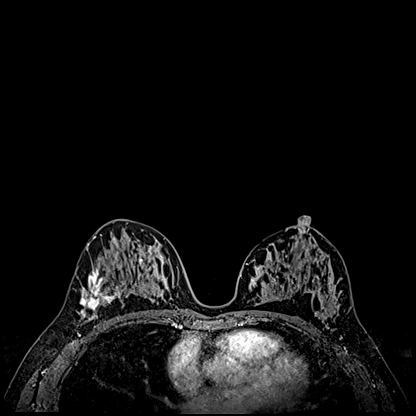
[im 84/112]
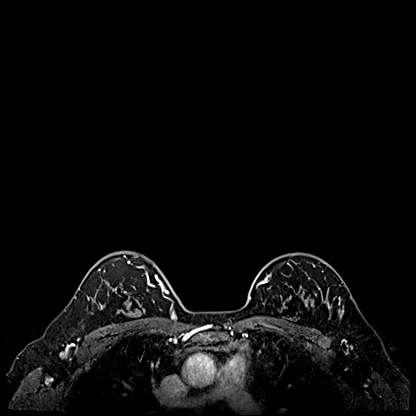
[im 112/112]
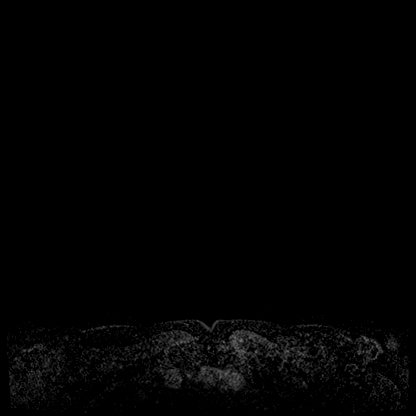

[Series 7: T1 · axial · 1.4mm · 0.84mm/px · z∈[-63,+92]mm · 5 of 112 slices shown]
[im 1/112]
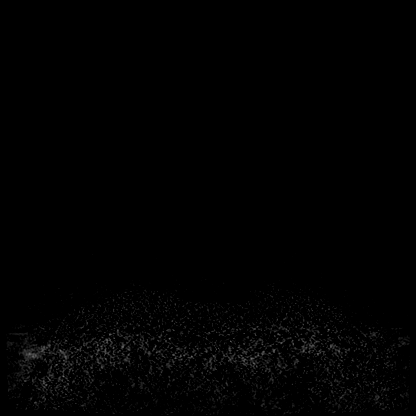
[im 28/112]
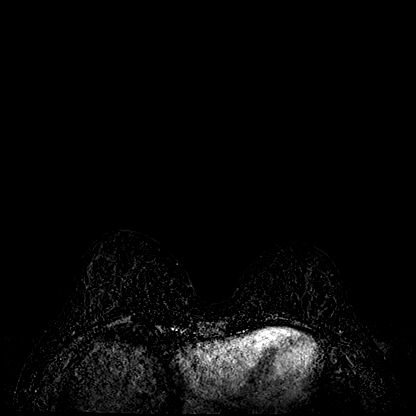
[im 56/112]
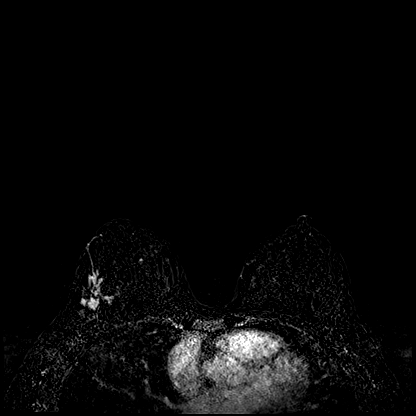
[im 84/112]
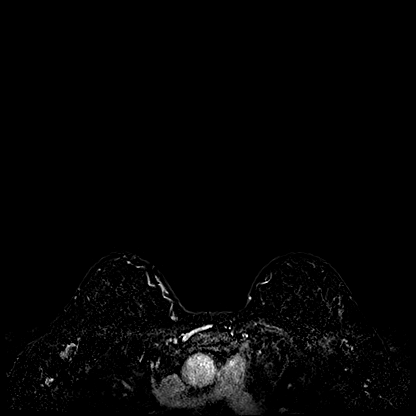
[im 112/112]
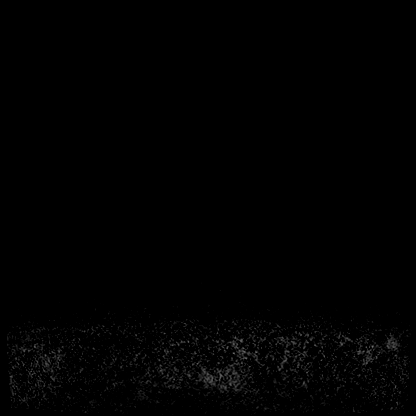

[Series 10: T1 fat-sat · axial · 1.4mm · 0.84mm/px · 1 of 112 slices shown (4 of 4)]
[im 1/112]
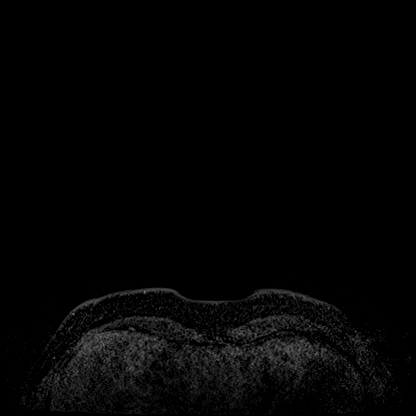

[28 of 48 positions shown; findings below may reference images not displayed]

Three-dimensional MR images were rendered by post-processing of the
original MR data on an independent workstation. The
three-dimensional MR images were interpreted, and findings are
reported in the following complete MRI report for this study. Three
dimensional images were evaluated at the independent interpreting
workstation using the DynaCAD thin client.
FINDINGS: Breast composition: d. Extreme fibroglandular tissue.

Background parenchymal enhancement: Mild

Right breast: A 4 x 3.6 x 3.6 cm (AP x transverse x CC) UPPER-OUTER
RIGHT breast irregular enhancing mass/masslike enhancement, middle
and posterior depth, is identified compatible with biopsy-proven
malignancy. Biopsy clip artifact within the mass is identified.

A 0.5 cm bilobed mass within the UPPER INNER RIGHT breast (series
11: Image 56), middle depth, exhibits persistent enhancement and
indeterminate.

No other suspicious abnormalities are noted within the RIGHT breast.

Biopsy clip artifact within the posterior UPPER INNER RIGHT breast
represents the recent benign biopsy demonstrating fibrocystic and
fibroadenomatoid changes, and lies posterior and SUPERIOR to the
indeterminate 0.5 cm mass described above.

Left breast: No mass or abnormal enhancement.

Lymph nodes: No abnormal appearing lymph nodes.

Ancillary findings:  None.
IMPRESSION: 1. 4 x 3.6 x 3.6 cm biopsy-proven malignancy within the UPPER-OUTER
RIGHT breast.
2. Indeterminate 0.5 cm UPPER INNER RIGHT breast mass, middle depth.
If breast conservation is desired, tissue sampling is recommended.
3. No MR evidence of LEFT breast malignancy and no abnormal
appearing lymph nodes.

RECOMMENDATION:
1. MR guided biopsy of the indeterminate 0.5 cm UPPER INNER RIGHT
breast mass, middle depth, if breast conservation is desired.

BI-RADS CATEGORY  4: Suspicious.

## 2021-06-15 MED ORDER — GADOBUTROL 1 MMOL/ML IV SOLN
6.0000 mL | Freq: Once | INTRAVENOUS | Status: AC | PRN
  Administered 2021-06-15: 6 mL via INTRAVENOUS

## 2021-06-19 ENCOUNTER — Inpatient Hospital Stay: Payer: Self-pay | Attending: Hematology and Oncology | Admitting: Hematology and Oncology

## 2021-06-19 ENCOUNTER — Inpatient Hospital Stay: Payer: Self-pay

## 2021-06-19 ENCOUNTER — Encounter: Payer: Self-pay | Admitting: Hematology and Oncology

## 2021-06-19 ENCOUNTER — Other Ambulatory Visit: Payer: Self-pay

## 2021-06-19 ENCOUNTER — Other Ambulatory Visit: Payer: Self-pay | Admitting: *Deleted

## 2021-06-19 DIAGNOSIS — E785 Hyperlipidemia, unspecified: Secondary | ICD-10-CM

## 2021-06-19 DIAGNOSIS — E119 Type 2 diabetes mellitus without complications: Secondary | ICD-10-CM

## 2021-06-19 DIAGNOSIS — I10 Essential (primary) hypertension: Secondary | ICD-10-CM

## 2021-06-19 DIAGNOSIS — Z86718 Personal history of other venous thrombosis and embolism: Secondary | ICD-10-CM

## 2021-06-19 DIAGNOSIS — Z170 Estrogen receptor positive status [ER+]: Secondary | ICD-10-CM

## 2021-06-19 DIAGNOSIS — C50411 Malignant neoplasm of upper-outer quadrant of right female breast: Secondary | ICD-10-CM

## 2021-06-19 NOTE — Progress Notes (Signed)
Hannah Murray ?CONSULT NOTE ? ?Patient Care Team: ?Pcp, No as PCP - General ? ?CHIEF COMPLAINTS/PURPOSE OF CONSULTATION:  ?Newly diagnosed breast cancer ? ?HISTORY OF PRESENTING ILLNESS:  ?Hannah Murray 46 y.o. female is here because of recent diagnosis of right  ? ?I reviewed her records extensively and collaborated the history with the patient. ? ?SUMMARY OF ONCOLOGIC HISTORY: ?Oncology History  ?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, right ([place])  ?05/06/2021 Mammogram  ? Highly suspicious ill defined palpable lump in the right breast at 10 0 clock position measuring at least 3.6 cms. ?Indeterminate mass in the right breast at 12:30, which may represent a fibroadenoma. ?No evidence of right axillary adenopathy. ?  ?05/16/2021 Pathology Results  ? Right breast 10 0 clock mass biopsy showed invasive mammary carcinoma, ductal phenotype prognostics include ER 95% strong staining intensity PR 95% strong staining intensity Ki-67 of 40% and HER2 negative ?  ?06/11/2021 Initial Diagnosis  ? 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, right ([place]) ?  ?06/11/2021 Cancer Staging  ? Staging form: Breast, AJCC 8th Edition ?- Clinical stage from 06/11/2021: Stage IIA (cT2, cN0, cM0, G3, ER+, PR+, HER2-) - Signed by [name], MD on 06/19/2021 ?Stage prefix: Initial diagnosis ?Method of lymph node assessment: Clinical ?Histologic grading system: 3 grade system ? ?  ? ?A certified Patent attorney used for the conversation.  She arrived to the appointment by herself.  She denies any palpable masses.  She went to her OB and was found to have this breast mass on exam and hence referred to oncology.  She denies any history of breast cancer in her family.  No known past medical history.  She was seen by Dr. Marlou Starks on June 04, 2021, recommendation was to get an MRI to better evaluate the true size and consideration was for lumpectomy versus mastectomy depending on the size of the  tumor. ? ?MEDICAL HISTORY:  ?Past medical history significant for diabetes mellitus, history of DVT, end-stage liver disease, heart valve disease, esophageal varices, hypertension and dyslipidemia per Dr. Ethlyn Gallery note ? ?SURGICAL HISTORY: ?No past surgical history on file. ? ?SOCIAL HISTORY: ?Social History  ? ?Socioeconomic History  ? Marital status: Married  ?  Spouse name: Not on file  ? Number of children: 1  ? Years of education: Not on file  ? Highest education level: High school graduate  ?Occupational History  ? Not on file  ?Tobacco Use  ? Smoking status: Never  ? Smokeless tobacco: Never  ?Vaping Use  ? Vaping Use: Never used  ?Substance and Sexual Activity  ? Alcohol use: Yes  ?  Comment: occasionally  ? Drug use: Never  ? Sexual activity: Not Currently  ?Other Topics Concern  ? Not on file  ?Social History Narrative  ? Not on file  ? ?Social Determinants of Health  ? ?Financial Resource Strain: Not on file  ?Food Insecurity: No Food Insecurity  ? Worried About Charity fundraiser in the Last Year: Never true  ? Ran Out of Food in the Last Year: Never true  ?Transportation Needs: No Transportation Needs  ? Lack of Transportation (Medical): No  ? Lack of Transportation (Non-Medical): No  ?Physical Activity: Not on file  ?Stress: Not on file  ?Social Connections: Not on file  ?Intimate Partner Violence: Not on file  ? ? ?FAMILY HISTORY: ?Family History  ?Problem Relation Age of Onset  ? Hypertension Father   ? Diabetes Father   ?  Thyroid disease Father   ? ? ?ALLERGIES:  has No Known Allergies. ? ?MEDICATIONS:  ?No current outpatient medications on file.  ? ?No current facility-administered medications for this visit.  ? ? ?REVIEW OF SYSTEMS:   ?Constitutional: Denies fevers, chills or abnormal night sweats ?Eyes: Denies blurriness of vision, double vision or watery eyes ?Ears, nose, mouth, throat, and face: Denies mucositis or sore throat ?Respiratory: Denies cough, dyspnea or wheezes ?Cardiovascular:  Denies palpitation, chest discomfort or lower extremity swelling ?Gastrointestinal:  Denies nausea, heartburn or change in bowel habits ?Skin: Denies abnormal skin rashes ?Lymphatics: Denies new lymphadenopathy or easy bruising ?Neurological:Denies numbness, tingling or new weaknesses ?Behavioral/Psych: Mood is stable, no new changes  ?Breast: Denies any palpable lumps or discharge ?All other systems were reviewed with the patient and are negative. ? ?PHYSICAL EXAMINATION: ?ECOG PERFORMANCE STATUS: 0 - Asymptomatic ? ?Vitals:  ? 06/19/21 1400  ?BP: (!) 141/70  ?Pulse: 78  ?Resp: 19  ?Temp: (!) 97.3 ?F (36.3 ?C)  ?SpO2: 98%  ? ?Filed Weights  ? 06/19/21 1400  ?Weight: 149 lb 1.6 oz (67.6 kg)  ? ? ?GENERAL:alert, no distress and comfortable ?SKIN: skin color, texture, turgor are normal, no rashes or significant lesions ?EYES: normal, conjunctiva are pink and non-injected, sclera clear ?OROPHARYNX:no exudate, no erythema and lips, buccal mucosa, and tongue normal  ?NECK: supple, thyroid normal size, non-tender, without nodularity ?LYMPH:  no palpable lymphadenopathy in the cervical, axillary or inguinal ?LUNGS: clear to auscultation and percussion with normal breathing effort ?HEART: regular rate & rhythm and no murmurs and no lower extremity edema ?ABDOMEN:abdomen soft, non-tender and normal bowel sounds ?Musculoskeletal:no cyanosis of digits and no clubbing  ?PSYCH: alert & oriented x 3 with fluent speech ?NEURO: no focal motor/sensory deficits ?BREAST: Palpable right breast upper outer quadrant mass measuring around 3-1/2 to 4 cm, mobile on palpation.  Skin appears to be not involved.  No palpable regional lymphadenopathy. ? ? ?LABORATORY DATA:  ?I have reviewed the data as listed ?No results found for: WBC, HGB, HCT, MCV, PLT ?No results found for: NA, K, CL, CO2 ? ?RADIOGRAPHIC STUDIES: ?I have personally reviewed the radiological reports and agreed with the findings in the report. ? ?ASSESSMENT AND PLAN:   ?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, right ([place]) ?This is a 47 year old female patient with newly diagnosed right breast IDC, ER/PR positive, HER2 negative, clinically stage T2 N0 M0 grade 2/3 referred to medical oncology for recommendations. ?She was already seen by Dr. Marlou Starks for consideration of surgery, he recommended an MRI to evaluate the true size of the tumor followed by surgery possibly lumpectomy versus mastectomy.  I agree with upfront surgery given strong ER/PR positivity and HER2 negative tumor.  Postsurgery we will proceed with Oncotype Dx.   ? ?We have discussed about Oncotype Dx score which is a well validated prognostic scoring system which can predict outcome with endocrine therapy alone and whether chemotherapy reduces recurrence.  Typically in patients with ER positive cancers that are node negative if the RS score is high typically greater than or equal to 26, chemotherapy is recommended.  ?In women with intermediate recurrence score younger than 49, there can still be some role for chemotherapy in addition to endocrine therapy especially if the recurrence score is between 21-25. ?If chemotherapy is needed, this will precede radiation and then after radiation she will continue on antiestrogen therapy. ? ?We have also discussed about antiestrogen therapy today. We have discussed options for antiestrogen therapy today ? ?  With regards to Tamoxifen, we discussed that this is a SERM, selective estrogen receptor modulator. We discussed mechanism of action of Tamoxifen, adverse effects on Tamoxifen including but not limited to post menopausal symptoms, increased risk of DVT/PE, increased risk of endometrial cancer, questionable cataracts with long term use and increased risk of cardiovascular events in the study which was not statistically significant. A benefit from Tamoxifen would be improvement in bone density. ? ?Although her past medical history in the note mentions DVT,  she denies any personal history of DVT when I talk to her using the certified Spanish translator.  I will talk to her once again about this when she returns for follow-up.  If she indeed had history of DVT she might

## 2021-06-19 NOTE — Assessment & Plan Note (Addendum)
This is a 46 year old female patient with newly diagnosed right breast IDC, ER/PR positive, HER2 negative, clinically stage T2 N0 M0 grade 2/3 referred to medical oncology for recommendations. ?She was already seen by Dr. Marlou Starks for consideration of surgery, he recommended an MRI to evaluate the true size of the tumor followed by surgery possibly lumpectomy versus mastectomy.  I agree with upfront surgery given strong ER/PR positivity and HER2 negative tumor.  Postsurgery we will proceed with Oncotype Dx.   ? ?We have discussed about Oncotype Dx score which is a well validated prognostic scoring system which can predict outcome with endocrine therapy alone and whether chemotherapy reduces recurrence.  Typically in patients with ER positive cancers that are node negative if the RS score is high typically greater than or equal to 26, chemotherapy is recommended.  ?In women with intermediate recurrence score younger than 45, there can still be some role for chemotherapy in addition to endocrine therapy especially if the recurrence score is between 21-25. ?If chemotherapy is needed, this will precede radiation and then after radiation she will continue on antiestrogen therapy. ? ?We have also discussed about antiestrogen therapy today. We have discussed options for antiestrogen therapy today ? ?With regards to Tamoxifen, we discussed that this is a SERM, selective estrogen receptor modulator. We discussed mechanism of action of Tamoxifen, adverse effects on Tamoxifen including but not limited to post menopausal symptoms, increased risk of DVT/PE, increased risk of endometrial cancer, questionable cataracts with long term use and increased risk of cardiovascular events in the study which was not statistically significant. A benefit from Tamoxifen would be improvement in bone density. ? ?Although her past medical history in the note mentions DVT, she denies any personal history of DVT when I talk to her using the certified  Spanish translator.  I will talk to her once again about this when she returns for follow-up.  If she indeed had history of DVT she might be a high risk for recurrence while on tamoxifen.  I will discuss about considering prophylactic anticoagulation if indeed she had a DVT versus monitoring. ? ?She would also qualify for genetic testing she will return to clinic after surgery and Oncotype results. ? ?

## 2021-06-20 ENCOUNTER — Telehealth: Payer: Self-pay | Admitting: Hematology and Oncology

## 2021-06-20 ENCOUNTER — Encounter: Payer: Self-pay | Admitting: *Deleted

## 2021-06-20 NOTE — Telephone Encounter (Signed)
.  Called patient to schedule appointment per 4/27 inbasket, patient is aware of date and time.  Pt requested to come at 2pm, scheduled per first 2 pm available.  ?

## 2021-06-21 ENCOUNTER — Other Ambulatory Visit: Payer: Self-pay | Admitting: General Surgery

## 2021-06-21 ENCOUNTER — Telehealth: Payer: Self-pay | Admitting: *Deleted

## 2021-06-21 DIAGNOSIS — R9389 Abnormal findings on diagnostic imaging of other specified body structures: Secondary | ICD-10-CM

## 2021-06-21 NOTE — Telephone Encounter (Signed)
Hannah Murray - interpreter -calling patient to give navigator contact information and inform her to call with any questions or concerns. ?

## 2021-06-21 NOTE — Telephone Encounter (Signed)
Error

## 2021-06-24 ENCOUNTER — Other Ambulatory Visit: Payer: Self-pay

## 2021-06-25 ENCOUNTER — Encounter: Payer: Self-pay | Admitting: *Deleted

## 2021-06-25 ENCOUNTER — Encounter: Payer: Self-pay | Admitting: Plastic Surgery

## 2021-06-25 ENCOUNTER — Ambulatory Visit (INDEPENDENT_AMBULATORY_CARE_PROVIDER_SITE_OTHER): Payer: Self-pay | Admitting: Plastic Surgery

## 2021-06-25 DIAGNOSIS — C50411 Malignant neoplasm of upper-outer quadrant of right female breast: Secondary | ICD-10-CM

## 2021-06-25 NOTE — Progress Notes (Signed)
? ?  Subjective:  ? ? Patient ID: Hannah Murray, female    DOB: 11-12-1975, 46 y.o.   MRN: [id number] ? ?The patient is a 46 yrs old female joining me by phone for discussion about her breast reconstruction.  She had an MRI which was negative for any concerns on the left breast.  It showed a 4 x 3.6 x 3.6 cm upper outer quadrant right breast irregularity with enhancing masslike characteristics.  It was middle and posterior depth there was also a 0.5 bilobed mass within the upper inner right breast at middle depth with persistent enhancement in thought to be intermediate.  We are not sure if this means she should have a mastectomy or if she still eligible for a partial mastectomy.  The known lesion is estrogen and progesterone positive and HER2 negative.  She is 5 feet 6 inches tall and weighs 146 pounds with a preoperative bra size of a D cup.  Patient is not sure what treatment to do and would certainly appreciate Dr. Ethlyn Gallery input. ? ? ? ? ?Review of Systems  ?Constitutional: Negative.   ?HENT: Negative.    ?Eyes: Negative.   ?Respiratory: Negative.    ?Cardiovascular: Negative.   ?Gastrointestinal: Negative.   ?Endocrine: Negative.   ?Genitourinary: Negative.   ?Musculoskeletal: Negative.   ?Skin: Negative.   ?Neurological: Negative.   ? ?   ?Objective:  ? Physical Exam ? ? ?   ?Assessment & Plan:  ? ?  ICD-10-CM   ?1. 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, right ([place])  C50.411   ?  ?  ?I have placed a message to Dr. Marlou Starks.  I will plan to call the patient as soon as I hear back from him.  The question is whether or not she is eligible for a partial mastectomy versus a full right-sided mastectomy with an implant-based reconstruction. ? ?I connected with  Katianne Sherrin on 06/25/21 by phone and verified that I am speaking with the correct person using two identifiers. I spent 15 minutes in the following manner: Review of chart, review of MRI, discussion with patient and documentation. ?  ?I  discussed the limitations of evaluation and management by telemedicine. The patient expressed understanding and agreed to proceed. ? ?

## 2021-06-30 NOTE — Progress Notes (Signed)
?Radiation Oncology         [phone] ?________________________________ ? ?Name: Hannah Murray        MRN: [id number]  ?Date of Service: 07/02/2021 DOB: 1976-01-25 ? ?CC:Pcp, No  Jovita Kussmaul, MD    ? ?REFERRING PHYSICIAN: Jovita Kussmaul, MD ? ? ?DIAGNOSIS: The encounter diagnosis was 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, right ([place]). ? ? ?HISTORY OF PRESENT ILLNESS: Hannah Murray is a 46 y.o. female seen at the request of Dr. Marlou Starks for a diagnosis of right breast cancer. The patient was noted to have a palpable mass in the right breast and further diagnostic work-up showed a mass in the upper outer quadrant.  By ultrasound this was located in the 10 o'clock position measuring at least 3.6 cm, a second hypoechoic mass measuring 1 cm in the 12:30 position was also seen in her right axillary lymph nodes were normal in appearance.  She underwent biopsies on 05/16/2021, the 10 o'clock position biopsy showed an invasive ductal carcinoma that was grade 2-3.  The tumor was ER/PR positive, HER2 negative with a Ki-67 of 40%.  Her biopsy in the 12:30 position showed fibrocystic change negative for carcinoma.  She did undergo an MRI for extent of disease on 06/15/2021 which confirmed the cancer in the upper outer quadrant of the right breast measuring up to 4 cm in greatest dimension, and the indeterminant finding in the right mass measuring 5 mm was noted.  No evidence of any additional disease or findings in the left breast were noted.  She is scheduled to undergo another biopsy on 07/08/2021 of the indeterminate 5 mm lesion in the upper inner right breast.  She met with Dr. Chryl Heck as well and she plans to order Oncotype DX score on her pathology.  The patient is contemplating surgical approach, and has met with plastics if she does undergo mastectomy.  She is seen today however if she undergoes breast conserving surgery to discuss adjuvant treatment. ? ? ? ?PREVIOUS RADIATION THERAPY: No ? ? ?PAST  MEDICAL HISTORY: History reviewed. No pertinent past medical history.   ? ? ?PAST SURGICAL HISTORY:History reviewed. No pertinent surgical history. ? ? ?FAMILY HISTORY:  ?Family History  ?Problem Relation Age of Onset  ? Hypertension Father   ? Diabetes Father   ? Thyroid disease Father   ? ? ? ?SOCIAL HISTORY:  reports that she has never smoked. She has never used smokeless tobacco. She reports current alcohol use. She reports that she does not use drugs. The patient is married and lives in [place].  ? ? ?ALLERGIES: Patient has no known allergies. ? ? ?MEDICATIONS:  ?No current outpatient medications on file.  ? ?No current facility-administered medications for this encounter.  ? ? ? ?REVIEW OF SYSTEMS: On review of systems, the patient reports that she is doing okay. She's trying to navigate decision making for her surgery as it relates to additional biopsies and genetics. No other complaints are verbalized.  ? ?  ? ?PHYSICAL EXAM:  ?Wt Readings from Last 3 Encounters:  ?07/02/21 147 lb 2 oz (66.7 kg)  ?06/19/21 149 lb 1.6 oz (67.6 kg)  ?06/11/21 146 lb 9.6 oz (66.5 kg)  ? ?Temp Readings from Last 3 Encounters:  ?07/02/21 (!) 97 ?F (36.1 ?C) (Temporal)  ?06/19/21 (!) 97.3 ?F (36.3 ?C) (Temporal)  ? ?BP Readings from Last 3 Encounters:  ?07/02/21 131/67  ?06/19/21 (!) 141/70  ?06/11/21 119/71  ? ?Pulse Readings from Last 3 Encounters:  ?  07/02/21 71  ?06/19/21 78  ?06/11/21 79  ? ? ?In general this is a well appearing Hispanic female in no acute distress. She's alert and oriented x4 and appropriate throughout the examination. Cardiopulmonary assessment is negative for acute distress and she exhibits normal effort. Bilateral breast exam is deferred. ? ? ? ?ECOG = 0 ? ?0 - Asymptomatic (Fully active, able to carry on all predisease activities without restriction) ? ?1 - Symptomatic but completely ambulatory (Restricted in physically strenuous activity but ambulatory and able to carry out work of a light or  sedentary nature. For example, light housework, office work) ? ?2 - Symptomatic, <50% in bed during the day (Ambulatory and capable of all self care but unable to carry out any work activities. Up and about more than 50% of waking hours) ? ?3 - Symptomatic, >50% in bed, but not bedbound (Capable of only limited self-care, confined to bed or chair 50% or more of waking hours) ? ?4 - Bedbound (Completely disabled. Cannot carry on any self-care. Totally confined to bed or chair) ? ?5 - Death ? ? Oken MM, Creech RH, Tormey DC, et al. [phone]). "Toxicity and response criteria of the [facility]". [place] Oncol. 5 (6): 649-55 ? ? ? ?LABORATORY DATA:  ?No results found for: WBC, HGB, HCT, MCV, PLT ?No results found for: NA, K, CL, CO2 ?No results found for: ALT, AST, GGT, ALKPHOS, BILITOT ?  ? ?RADIOGRAPHY: MR BREAST BILATERAL W WO CONTRAST INC CAD ? ?Result Date: 06/17/2021 ?CLINICAL DATA:  46 year old female with newly diagnosed UPPER OUTER RIGHT breast invasive mammary carcinoma. Also with recent benign UPPER INNER RIGHT breast biopsy. EXAM: BILATERAL BREAST MRI WITH AND WITHOUT CONTRAST TECHNIQUE: Multiplanar, multisequence MR images of both breasts were obtained prior to and following the intravenous administration of 6 ml of Gadavist Three-dimensional MR images were rendered by post-processing of the original MR data on an independent workstation. The three-dimensional MR images were interpreted, and findings are reported in the following complete MRI report for this study. Three dimensional images were evaluated at the independent interpreting workstation using the DynaCAD thin client. COMPARISON:  Previous exam(s). FINDINGS: Breast composition: d. Extreme fibroglandular tissue. Background parenchymal enhancement: Mild Right breast: A 4 x 3.6 x 3.6 cm (AP x transverse x CC) UPPER-OUTER RIGHT breast irregular enhancing mass/masslike enhancement, middle and posterior depth, is identified  compatible with biopsy-proven malignancy. Biopsy clip artifact within the mass is identified. A 0.5 cm bilobed mass within the UPPER INNER RIGHT breast (series 11: Image 56), middle depth, exhibits persistent enhancement and indeterminate. No other suspicious abnormalities are noted within the RIGHT breast. Biopsy clip artifact within the posterior UPPER INNER RIGHT breast represents the recent benign biopsy demonstrating fibrocystic and fibroadenomatoid changes, and lies posterior and SUPERIOR to the indeterminate 0.5 cm mass described above. Left breast: No mass or abnormal enhancement. Lymph nodes: No abnormal appearing lymph nodes. Ancillary findings:  None. IMPRESSION: 1. 4 x 3.6 x 3.6 cm biopsy-proven malignancy within the UPPER-OUTER RIGHT breast. 2. Indeterminate 0.5 cm UPPER INNER RIGHT breast mass, middle depth. If breast conservation is desired, tissue sampling is recommended. 3. No MR evidence of LEFT breast malignancy and no abnormal appearing lymph nodes. RECOMMENDATION: 1. MR guided biopsy of the indeterminate 0.5 cm UPPER INNER RIGHT breast mass, middle depth, if breast conservation is desired. BI-RADS CATEGORY  4: Suspicious. Electronically Signed   By: Margarette Canada M.D.   On: 06/17/2021 11:18  ?   ? ?IMPRESSION/PLAN: ?  1. 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 Dr. Lisbeth Renshaw discusses the pathology findings and reviews the nature of right breast disease.  Dr. Lisbeth Renshaw reviews her case then neck steps for biopsy.  He discusses that the approach to surgery impacts the recommendations after surgery regarding radiation.  Dr. Chryl Heck plans Oncotype Dx score to determine a role for systemic therapy.  If she undergoes breast conserving surgery, and chemotherapy is not needed, Dr. Lisbeth Renshaw recommends external radiotherapy to the breast  to reduce risks of local recurrence followed by antiestrogen therapy.  He also discusses that if chemotherapy were  necessary radiotherapy would follow that.  We discussed the risks, benefits, short, and long term effects of radiotherapy, as well as the curative intent.   Dr. Lisbeth Renshaw discusses the delivery and logistics of radiotherapy a

## 2021-07-01 NOTE — Progress Notes (Signed)
New Breast Cancer Diagnosis: Right Breast UOQ ? ?Did patient present with symptoms (if so, please note symptoms) or screening mammography?:Palpable mass   ? ?Location and Extent of disease :right breast. Located at 10 o'clock position there was a ill-defined mass measuring at least 3.6 cm, and an associated mass at the 12:30 position also showed another mass measuring up to 1 cm.  Adenopathy no. ? ? ?MRI Breast 06/15/2021: 4 x 3.6 x 3.6 cm biopsy-proven malignancy within the UPPER-OUTER RIGHT breast.  Indeterminate 0.5 cm UPPER INNER RIGHT breast mass, middle depth.  If breast conservation is desired, tissue sampling is recommended.  No MR evidence of LEFT breast malignancy and no abnormal appearing lymph nodes. ? ?Histology per Pathology Report: grade 2-3, Invasive Ductal Carcinoma 10 o'clock and 12:30 Position 05-16-2021 ? ? ?Receptor Status: ER(positive), PR (positive), Her2-neu (negative), Ki-(40%) ? ? ?Surgeon and surgical plan, if any:  ?Dr. Marlou Starks 05/22/2021 ?- MRI breast bilateral with and without contrast; Future ?- Ambulatory Referral to Oncology-Medical ?- Ambulatory Referral to Plastic Surgery ?- Ambulatory Referral to Physical Therapy ?- Ambulatory Referral to Radiation Oncology ?- [facility] Case Posting Request; Future ? ? ?-Biopsy and Mammogram on Monday 07/08/2021 ? ?Medical oncologist, treatment if any:   ?Dr. Chryl Heck 06/19/2021 ?-She was already seen by Dr. Marlou Starks for consideration of surgery, he recommended an MRI to evaluate the true size of the tumor followed by surgery possibly lumpectomy versus mastectomy.  I ?- agree with upfront surgery given strong ER/PR positivity and HER2 negative tumor.  Postsurgery we will proceed with Oncotype Dx.   ?-We have also discussed about antiestrogen therapy today. We have discussed options for antiestrogen therapy today with regards to Tamoxifen. ? ? ?Plastic Surgery plan, if any: ?Dr. Marla Roe 06/25/2021 ?-I have placed a message to Dr. Marlou Starks.  I will plan to call the patient  as soon as I hear back from him.   ?-The question is whether or not she is eligible for a partial mastectomy versus a full right-sided mastectomy with an implant-based reconstruction. ?06/11/2021 ?- She has a new diagnosis of right lateral breast cancer.  So far the lymph nodes look normal. ?-Depending on the results will determine if she goes for a partial mastectomy or a full right sided mastectomy. ?-We will plan on doing a telemetry visit for a week and a half when the results of the MRI should be back.  The patient would like to keep her nipple areola if possible.  I think that this should be okay. -So if she needs a mastectomy we will likely do implant-based reconstruction. ? ? ?Family History of Breast/Ovarian/Prostate Cancer: No ? ?Lymphedema issues, if any: No    ? ?Pain issues, if any: No   ? ?SAFETY ISSUES: ?Prior radiation? No ?Pacemaker/ICD? No ?Possible current pregnancy? Having Cycles, Irregular, No birth control ?Is the patient on methotrexate? No ? ?Current Complaints / other details:   ?Genetics 07/03/2021 ? ?

## 2021-07-02 ENCOUNTER — Ambulatory Visit
Admission: RE | Admit: 2021-07-02 | Discharge: 2021-07-02 | Disposition: A | Discharge status: Home / Self Care | Payer: Self-pay | Source: Ambulatory Visit | Attending: Radiation Oncology | Admitting: Radiation Oncology

## 2021-07-02 ENCOUNTER — Encounter: Payer: Self-pay | Admitting: *Deleted

## 2021-07-02 ENCOUNTER — Other Ambulatory Visit: Payer: Self-pay

## 2021-07-02 ENCOUNTER — Encounter: Payer: Self-pay | Admitting: Radiation Oncology

## 2021-07-02 VITALS — BP 131/67 | HR 71 | Temp 97.0°F | Resp 18 | Ht 66.0 in | Wt 147.1 lb

## 2021-07-02 DIAGNOSIS — C50411 Malignant neoplasm of upper-outer quadrant of right female breast: Secondary | ICD-10-CM | POA: Insufficient documentation

## 2021-07-02 DIAGNOSIS — Z170 Estrogen receptor positive status [ER+]: Secondary | ICD-10-CM | POA: Insufficient documentation

## 2021-07-03 ENCOUNTER — Inpatient Hospital Stay: Payer: Self-pay | Attending: Hematology and Oncology | Admitting: Genetic Counselor

## 2021-07-03 ENCOUNTER — Inpatient Hospital Stay: Payer: Self-pay

## 2021-07-03 ENCOUNTER — Other Ambulatory Visit: Payer: Self-pay | Admitting: Genetic Counselor

## 2021-07-03 DIAGNOSIS — C50411 Malignant neoplasm of upper-outer quadrant of right female breast: Secondary | ICD-10-CM

## 2021-07-03 LAB — GENETIC SCREENING ORDER

## 2021-07-04 ENCOUNTER — Encounter: Payer: Self-pay | Admitting: Genetic Counselor

## 2021-07-04 NOTE — Progress Notes (Signed)
REFERRING PROVIDER: ?Jovita Kussmaul, MD ?[facility] ?[address] ?[place],  [place] [address] ? ?PRIMARY PROVIDER:  ?No PCP  ?PRIMARY REASON FOR VISIT:  ?1. 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, right ([place])   ? ? ? ?HISTORY OF PRESENT ILLNESS:   ?Hannah Murray, a 46 y.o. female, was seen for a [place] cancer genetics consultation at the request of Dr. Marlou Starks due to a personal history of breast cancer.  Hannah Murray presents to clinic today to discuss the possibility of a hereditary predisposition to cancer, to discuss genetic testing, and to further clarify her future cancer risks, as well as potential cancer risks for family members.  ? ?In April 2023, at the age of 89, Hannah Murray was diagnosed with invasive ductal carcinoma of the right breast (ER+/PR+/HER2-). The preliminary treatment plan includes surgery, Oncotype DX, adjuvant radiation, and anti-estrogens.  ? ?CANCER HISTORY:  ?Oncology History  ?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, right ([place])  ?05/06/2021 Mammogram  ? Highly suspicious ill defined palpable lump in the right breast at 10 0 clock position measuring at least 3.6 cms. ?Indeterminate mass in the right breast at 12:30, which may represent a fibroadenoma. ?No evidence of right axillary adenopathy. ?  ?05/16/2021 Pathology Results  ? Right breast 10 0 clock mass biopsy showed invasive mammary carcinoma, ductal phenotype prognostics include ER 95% strong staining intensity PR 95% strong staining intensity Ki-67 of 40% and HER2 negative ?  ?06/11/2021 Initial Diagnosis  ? 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  ?06/11/2021 Cancer Staging  ? Staging form: Breast, AJCC 8th Edition ?- Clinical stage from 06/11/2021: Stage IIA (cT2, cN0, cM0, G3, ER+, PR+, HER2-) - Signed by [name], MD on 06/19/2021 ?Stage prefix: Initial diagnosis ?Method of lymph node assessment: Clinical ?Histologic grading system: 3 grade  system ? ?  ? ? ? ?RISK FACTORS:  ?Mammogram within the last year: yes ?Colonoscopy: no; not examined. ?Hysterectomy: no.  ?Ovaries intact: yes.  ?Menarche was at age 55.  ?First live birth at age 49.  ?OCP use for approximately 0 years.  ? ? ?FAMILY HISTORY:  ?We obtained a detailed, 4-generation family history.  Significant diagnoses are listed below: ?Family History  ?Problem Relation Age of Onset  ? Ovarian cancer Other   ?     MGM's niece; d. > 42  ? ? ? ?Hannah Murray is unaware of previous family history of genetic testing for hereditary cancer risks. Patient's maternal and paternal ancestors are of [place] descent. There is no reported Ashkenazi Jewish ancestry. There is no known consanguinity. ? ?GENETIC COUNSELING ASSESSMENT: Hannah Murray is a 46 y.o. female with a personal history of cancer which is somewhat suggestive of a hereditary cancer syndrome and predisposition to cancer given her age of diagnosis. We, therefore, discussed and recommended the following at today's visit.  ? ?DISCUSSION: We discussed that 5 - 10% of cancer is hereditary, with most cases of hereditary breast cancer associated with mutations in BRCA1/2.  There are other genes that can be associated with hereditary breast cancer syndromes.  Type of cancer risk and level of risk are gene-specific. We discussed that testing is beneficial for several reasons including knowing how to follow individuals after completing their treatment, identifying whether potential treatment options would be beneficial, and understanding if other family members could be at risk for cancer and allowing them to undergo genetic testing.  ? ?We reviewed the characteristics,  features and inheritance patterns of hereditary cancer syndromes. We also discussed genetic testing, including the appropriate family members to test, the process of testing, insurance coverage and turn-around-time for results. We discussed the implications of a negative, positive and/or  variant of uncertain significant result. In order to get genetic test results in a timely manner so that Hannah Murray can use these genetic test results for surgical decisions, we recommended Hannah Murray pursue genetic testing for the [facility] Breast Cancer STAT Panel.  The STAT Breast cancer panel offered by [facility] includes sequencing and rearrangement analysis for the following 9 genes:  ATM, BRCA1, BRCA2, CDH1, CHEK2, PALB2, PTEN, STK11 and TP53.   Once complete, we recommend Hannah Murray pursue reflex genetic testing to a more comprehensive gene panel.  ? ? The Common Hereditary Cancers + RNA Panel offered by [facility] includes sequencing, deletion/duplication, and RNA testing of the following 47 genes: APC, ATM, AXIN2, BARD1, BMPR1A, BRCA1, BRCA2, BRIP1, CDH1, CDK4*, CDKN2A (p14ARF)*, CDKN2A (p16INK4a)*, CHEK2, CTNNA1, DICER1, EPCAM (Deletion/duplication testing only), GREM1 (promoter region deletion/duplication testing only), KIT, MEN1, MLH1, MSH2, MSH3, MSH6, MUTYH, NBN, NF1, NHTL1, PALB2, PDGFRA*, PMS2, POLD1, POLE, PTEN, RAD50, RAD51C, RAD51D, SDHB, SDHC, SDHD, SMAD4, SMARCA4. STK11, TP53, TSC1, TSC2, and VHL.  The following genes were evaluated for sequence changes only: SDHA and HOXB13 c.251G>A variant only.  RNA analysis is not performed for the * genes.   ? ?Based on Hannah Murray personal history of breast cancer before age 66, she meets medical criteria for genetic testing. Hannah Murray completed an application for genetic testing fee waiver today, which was submitted to [facility].  She knows that [facility] may request proof of income to verify eligibility.  ? ?PLAN: After considering the risks, benefits, and limitations, Hannah Murray provided informed consent to pursue genetic testing and the blood sample was sent to [facility] for analysis of the Breast STAT and Common Hereditary Cancers +RNA Panel. Results should be available within approximately 1-2 weeks' time, at which point they  will be disclosed by telephone to Hannah Murray, as will any additional recommendations warranted by these results. Hannah Murray will receive a summary of her genetic counseling visit and a copy of her results once available. This information will also be available in Epic.  ? ?Lastly, we encouraged Ms. Decelle to remain in contact with cancer genetics annually so that we can continuously update the family history and inform her of any changes in cancer genetics and testing that may be of benefit for this family.  ? ?Hannah Murray's questions were answered to her satisfaction today. Our contact information was provided should additional questions or concerns arise. Thank you for the referral and allowing [place] to share in the care of your patient.  ? ?Mikenzi Raysor M. Joette Catching, [place], [place] ?Genetic Counselor ?[email or web address] ?(P) [phone] ? ?The patient was seen for a total of 30 minutes in face-to-face genetic counseling with assistance of Spanish interpreter, Almyra Free. Drs. Ophelia Shoulder and/or Burr Medico were available to discuss this case as needed.  ?_______________________________________________________________________ ?For Office Staff:  ?Number of people involved in session: 1 ?Was an Intern/ student involved with case: no ? ?

## 2021-07-08 ENCOUNTER — Ambulatory Visit
Admission: RE | Admit: 2021-07-08 | Discharge: 2021-07-08 | Disposition: A | Discharge status: Home / Self Care | Payer: No Typology Code available for payment source | Source: Ambulatory Visit | Attending: General Surgery | Admitting: General Surgery

## 2021-07-08 ENCOUNTER — Ambulatory Visit
Admission: RE | Admit: 2021-07-08 | Discharge: 2021-07-08 | Disposition: A | Discharge status: Home / Self Care | Payer: Self-pay | Source: Ambulatory Visit | Attending: General Surgery | Admitting: General Surgery

## 2021-07-08 DIAGNOSIS — C50411 Malignant neoplasm of upper-outer quadrant of right female breast: Secondary | ICD-10-CM

## 2021-07-08 DIAGNOSIS — R9389 Abnormal findings on diagnostic imaging of other specified body structures: Secondary | ICD-10-CM

## 2021-07-08 HISTORY — PX: BREAST BIOPSY: SHX20

## 2021-07-08 IMAGING — MR MR BREAST BX W/ LOC DEV 1ST LEASION IMAGE BX SPEC MR GUIDE*R*
7 of 10 series · 33 of 48 positions shown · IV contrast (7 ml multilhance)
Comparison: [DATE] and earlier
COMPARISON: [DATE] and earlier

Addendum:
CLINICAL DATA: Patient presents for MR guided core biopsy of the
RIGHT breast.

EXAM:
MRI GUIDED CORE NEEDLE BIOPSY OF THE RIGHT BREAST
TECHNIQUE: Multiplanar, multisequence MR imaging of the RIGHT breast was
performed both before and after administration of intravenous
contrast.
CONTRAST:  6 ml Gadavist

[Series 2: fiducial unilateral · sagittal · 2.0mm · 1.33mm/px · 3 of 52 slices shown]
[im 1/52]
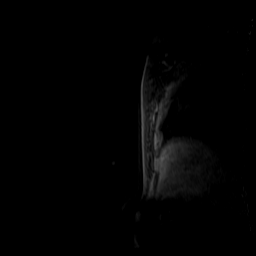
[im 26/52]
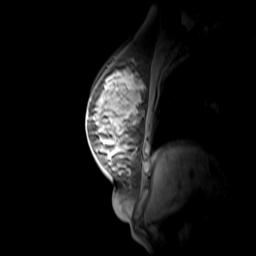
[im 52/52]
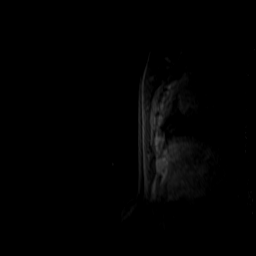

[Series 3: dynamic pre · axial · non-contrast · 1.3mm · 0.73mm/px · z∈[-74,+112]mm · 5 of 144 slices shown]
[im 1/144]
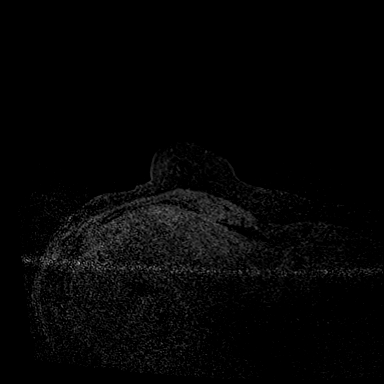
[im 36/144]
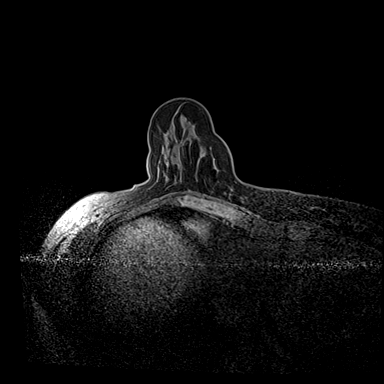
[im 72/144]
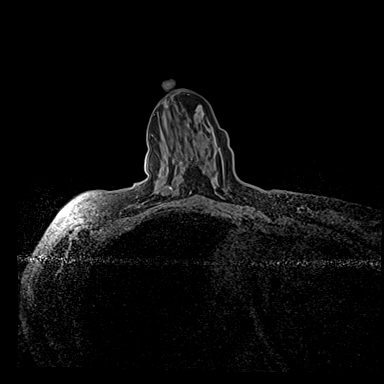
[im 108/144]
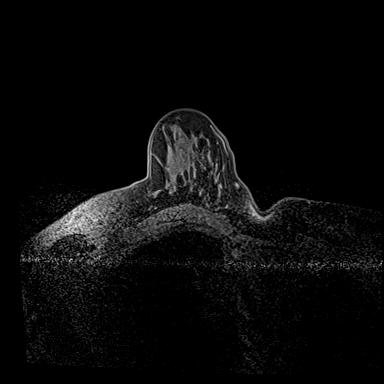
[im 144/144]
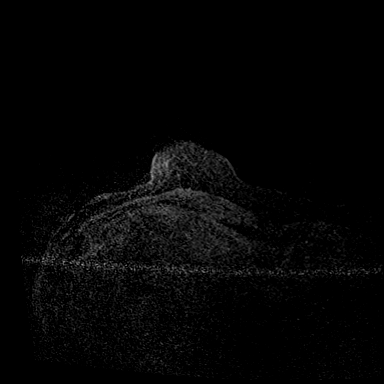

[Series 4: dynamic post 20 · axial · 1.3mm · 0.73mm/px · z∈[-74,+112]mm · 5 of 144 slices shown (1 of 2)]
[im 1/144]
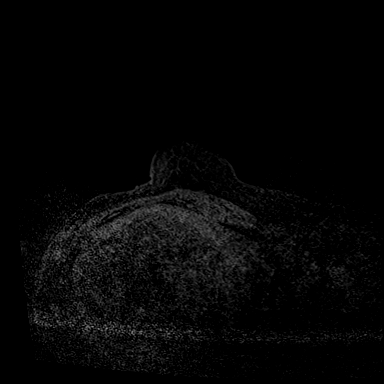
[im 36/144]
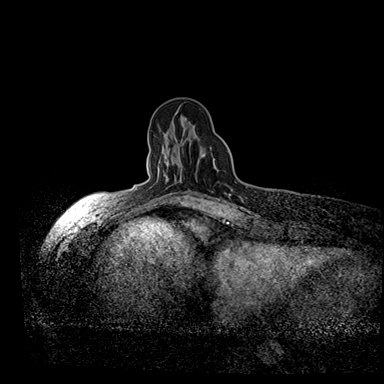
[im 72/144]
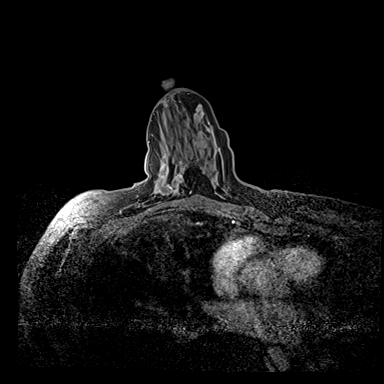
[im 108/144]
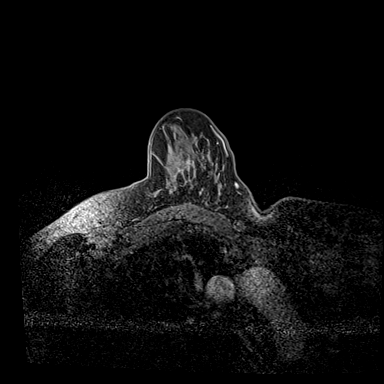
[im 144/144]
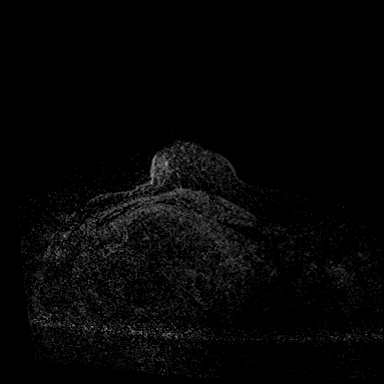

[Series 5: dynamic post 20 · axial · 1.3mm · 0.73mm/px · z∈[-74,+112]mm · 5 of 144 slices shown (2 of 2)]
[im 1/144]
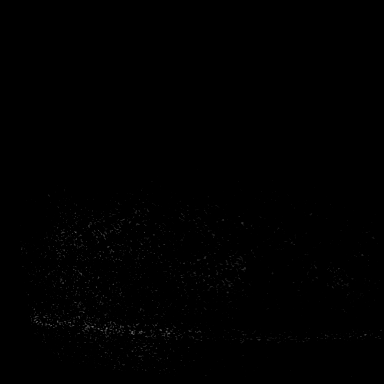
[im 36/144]
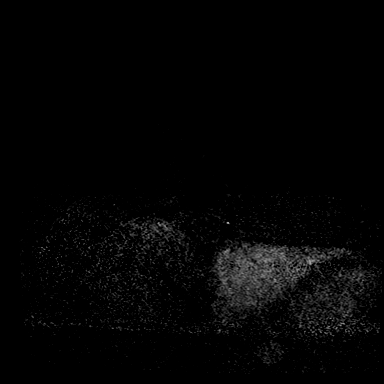
[im 72/144]
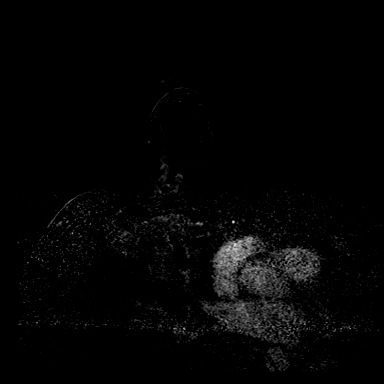
[im 108/144]
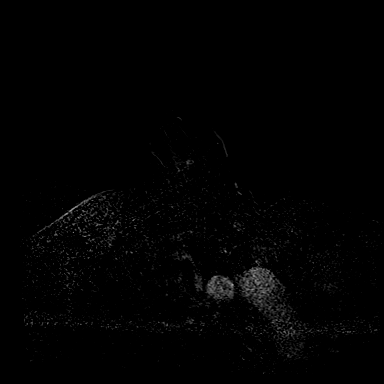
[im 144/144]
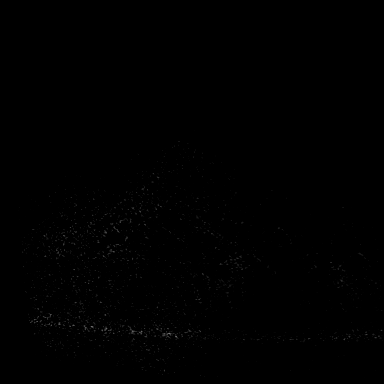

[Series 6: dynamic post 3 · axial · 1.3mm · 0.73mm/px · z∈[-74,+112]mm · 5 of 144 slices shown (1 of 2)]
[im 1/144]
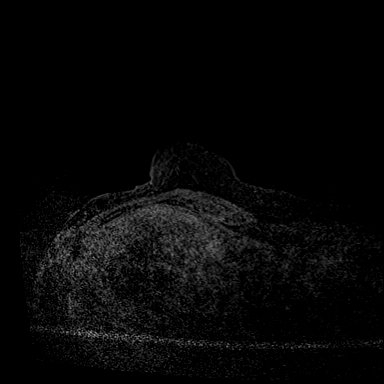
[im 36/144]
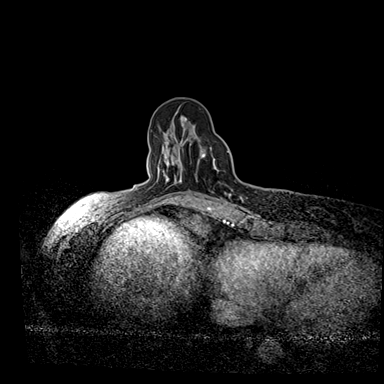
[im 72/144]
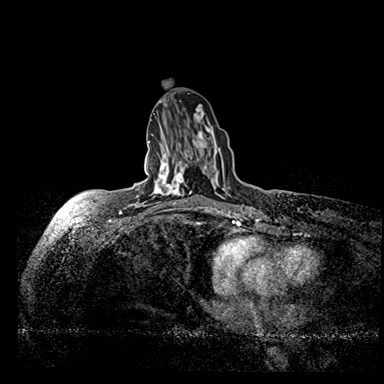
[im 108/144]
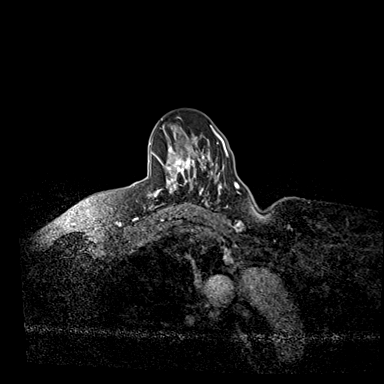
[im 144/144]
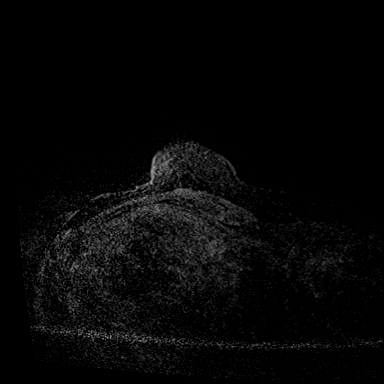

[Series 7: dynamic post 3 · axial · 1.3mm · 0.73mm/px · z∈[-74,+112]mm · 5 of 144 slices shown (2 of 2)]
[im 1/144]
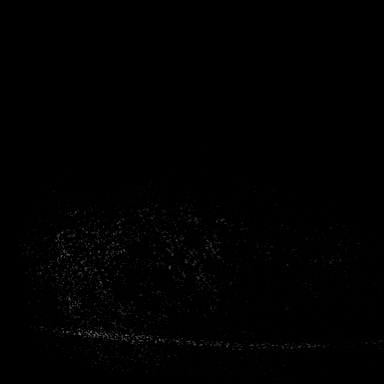
[im 36/144]
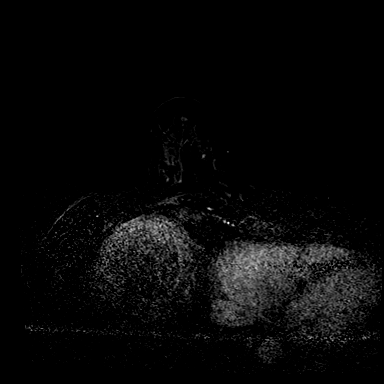
[im 72/144]
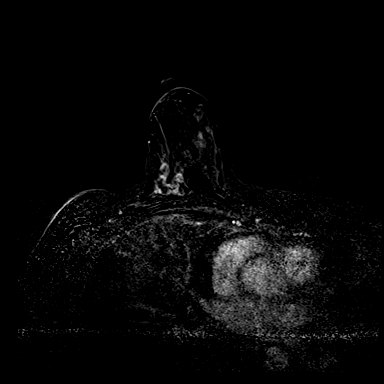
[im 108/144]
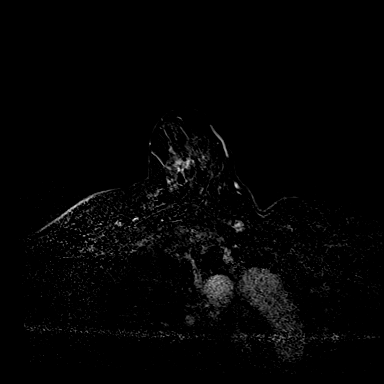
[im 144/144]
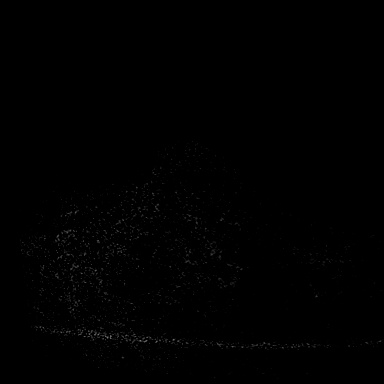

[Series 8: needle confirmation · axial · 1.3mm · 0.73mm/px · z∈[-74,+112]mm · 5 of 144 slices shown]
[im 1/144]
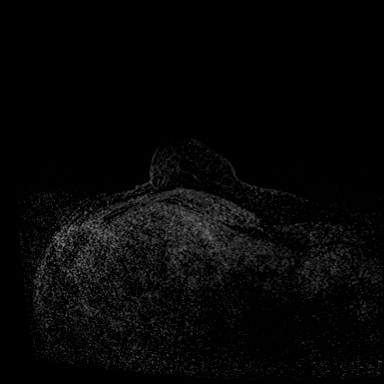
[im 36/144]
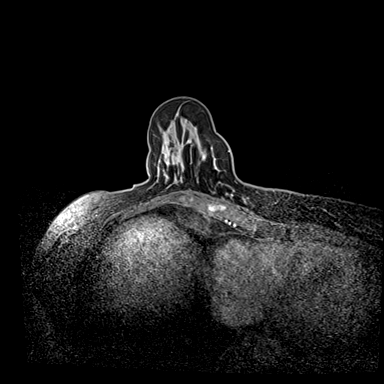
[im 72/144]
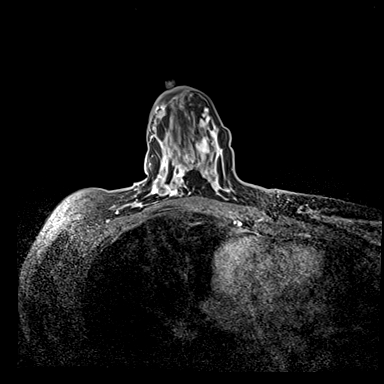
[im 108/144]
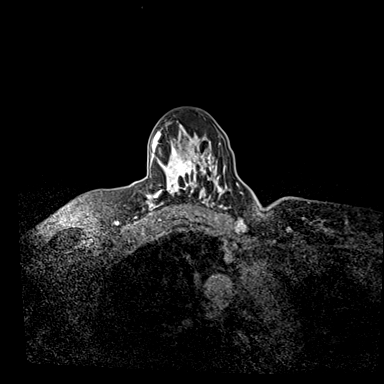
[im 144/144]
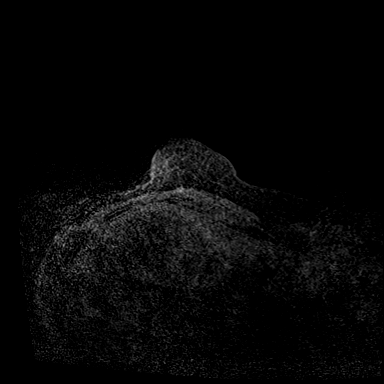

[33 of 48 positions shown; findings below may reference images not displayed]

FINDINGS: I met with the patient, and we discussed the procedure of MRI guided
biopsy, including risks, benefits, and alternatives. Specifically,
we discussed the risks of infection, bleeding, tissue injury, clip
migration, and inadequate sampling. Informed, written consent was
given. The usual time out protocol was performed immediately prior
to the procedure.

Using sterile technique, 1% Lidocaine, MRI guidance, and a 9 gauge
vacuum assisted device, biopsy was performed of enhancing mass in
the UPPER INNER QUADRANT of the RIGHT breast using a LATERAL to
MEDIAL approach. At the conclusion of the procedure, a barbell
tissue marker clip was deployed into the biopsy cavity. Follow-up
2-view mammogram was performed and dictated separately.

The entire procedure, including the consent process was performed
with the assistance of an interpreter.
IMPRESSION: MRI guided biopsy of RIGHT breast mass.  No apparent complications.

ADDENDUM:
Pathology revealed FIBROADENOMA- MILD FIBROCYSTIC CHANGE WITH USUAL
DUCTAL HYPERPLASIA- NEGATIVE FOR CARCINOMA of the RIGHT breast,
upper inner quadrant (barbell clip). This was found to be concordant
by Dr. GRAYER.

Pathology results were discussed with the patient by telephone with
GRAYER RN and GRAYER, Bilingual Patient Services
Representative. The patient reported doing well after the biopsy
with tenderness at the site. Post biopsy instructions and care were
reviewed and questions were answered. The patient was encouraged to
call The [REDACTED] for any additional
concerns.

The patient has a recent diagnosis of right breast cancer and should
follow her outlined treatment plan.

Pathology results reported by GRAYER RN on [DATE].

*** End of Addendum ***
FINDINGS: I met with the patient, and we discussed the procedure of MRI guided
biopsy, including risks, benefits, and alternatives. Specifically,
we discussed the risks of infection, bleeding, tissue injury, clip
migration, and inadequate sampling. Informed, written consent was
given. The usual time out protocol was performed immediately prior
to the procedure.

Using sterile technique, 1% Lidocaine, MRI guidance, and a 9 gauge
vacuum assisted device, biopsy was performed of enhancing mass in
the UPPER INNER QUADRANT of the RIGHT breast using a LATERAL to
MEDIAL approach. At the conclusion of the procedure, a barbell
tissue marker clip was deployed into the biopsy cavity. Follow-up
2-view mammogram was performed and dictated separately.

The entire procedure, including the consent process was performed
with the assistance of an interpreter.
IMPRESSION: MRI guided biopsy of RIGHT breast mass.  No apparent complications.

## 2021-07-08 IMAGING — MG MM BREAST LOCALIZATION CLIP
4 series · 4 of 12 positions shown · non-contrast
Comparison: Previous exam(s).

CLINICAL DATA: Status post MR guided core biopsy of mass in the
UPPER INNER QUADRANT of the RIGHT breast.

EXAM:
3D DIAGNOSTIC RIGHT MAMMOGRAM POST MRI BIOPSY

[R ML synth-2D]
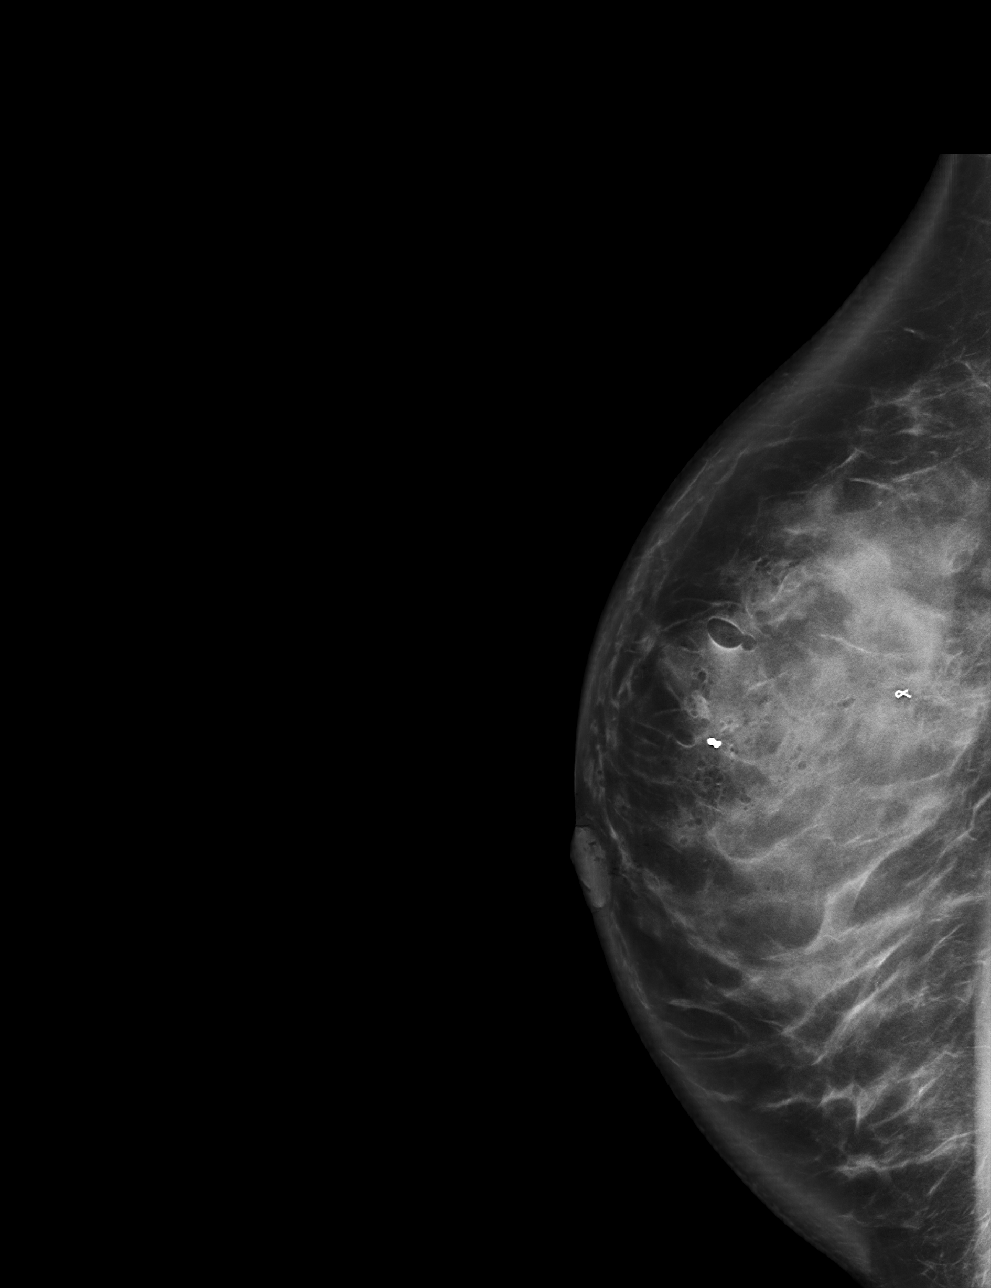

[R CC synth-2D]
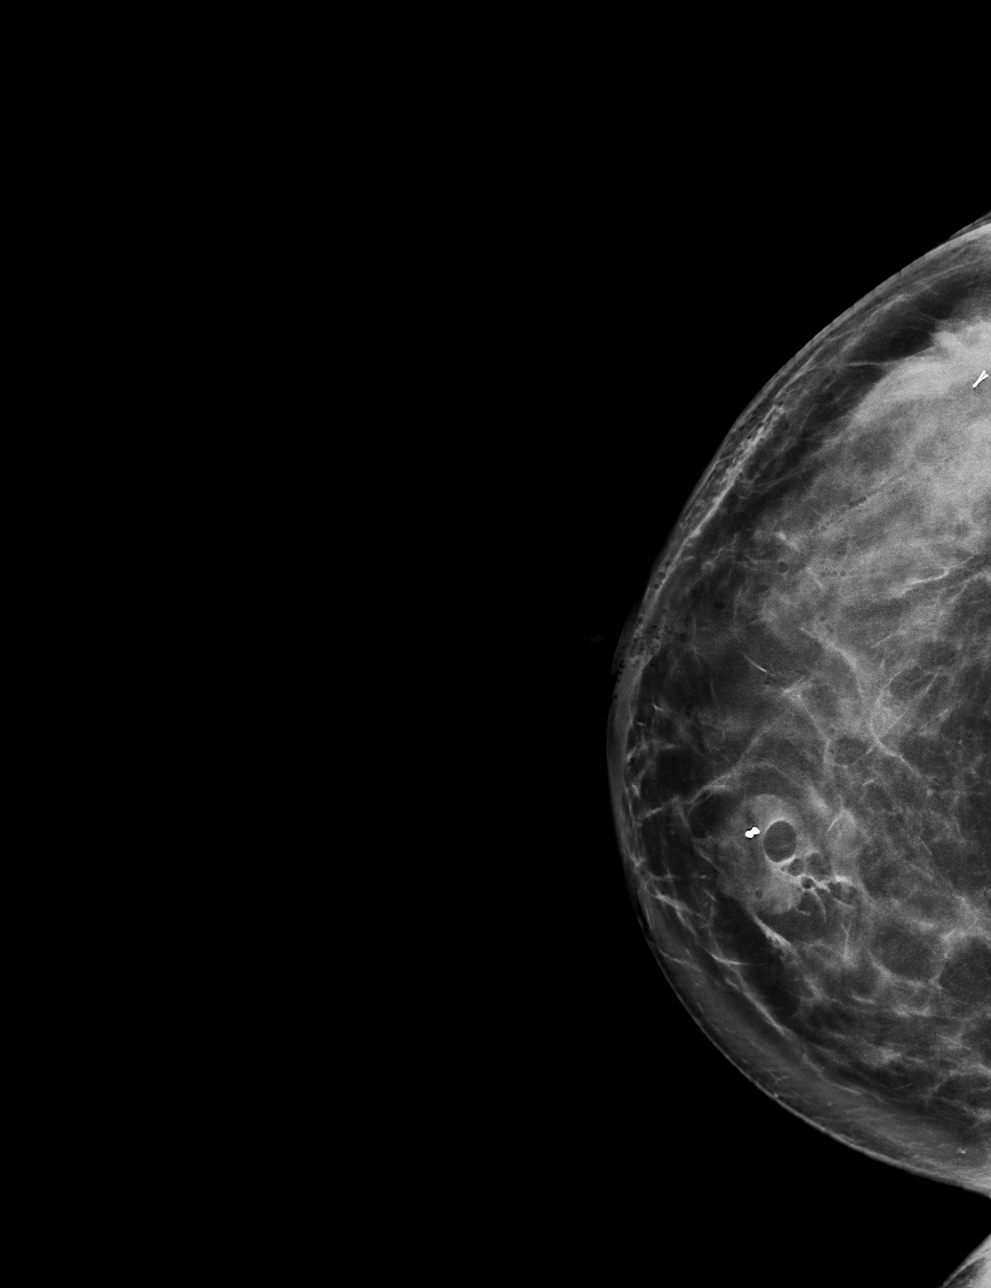

[R ML tomo · tomo slice 49/97.0]
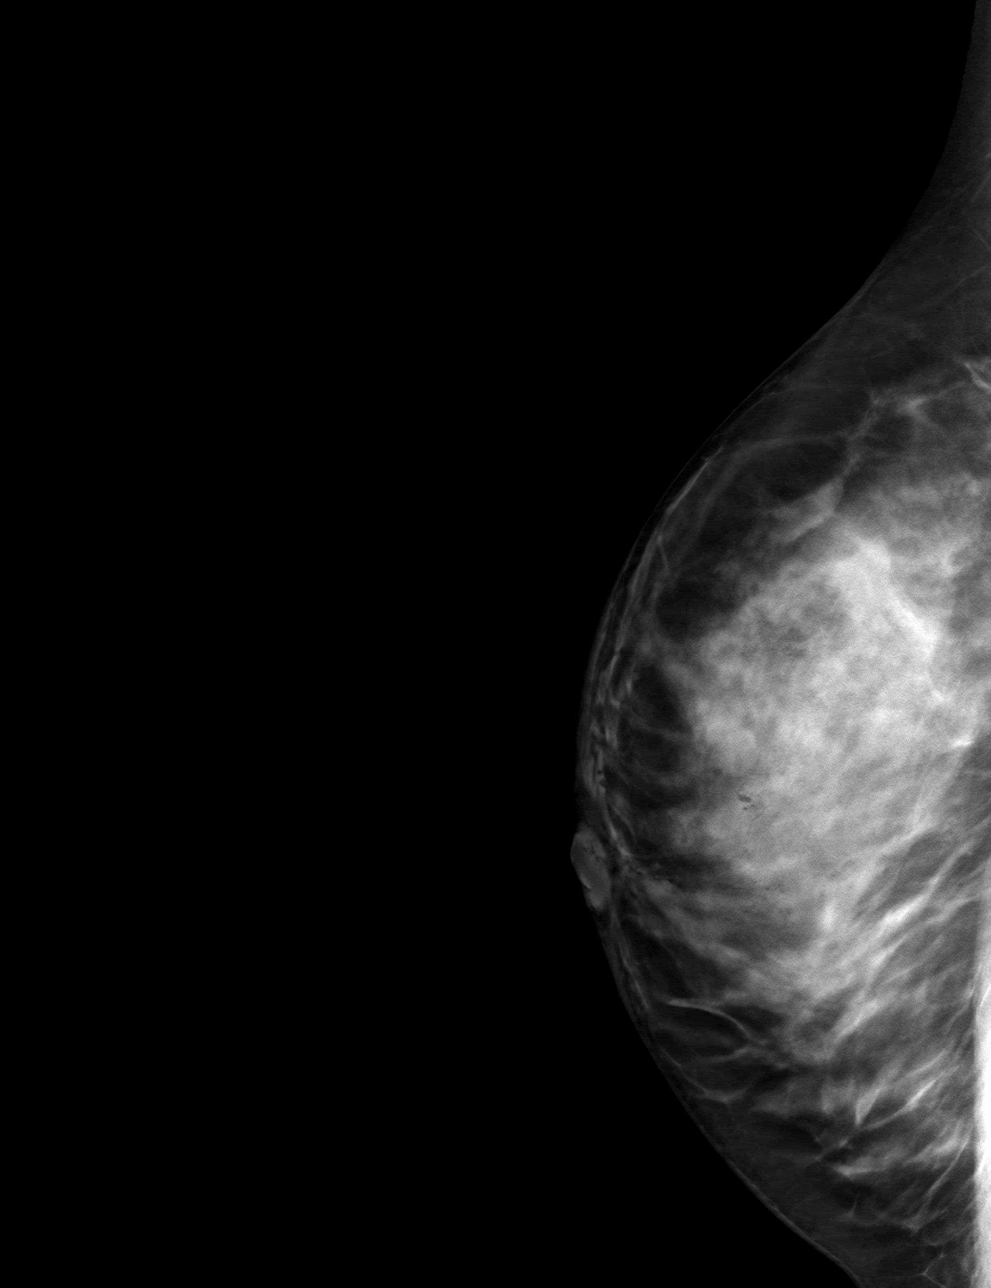

[R CC tomo · tomo slice 46/91.0]
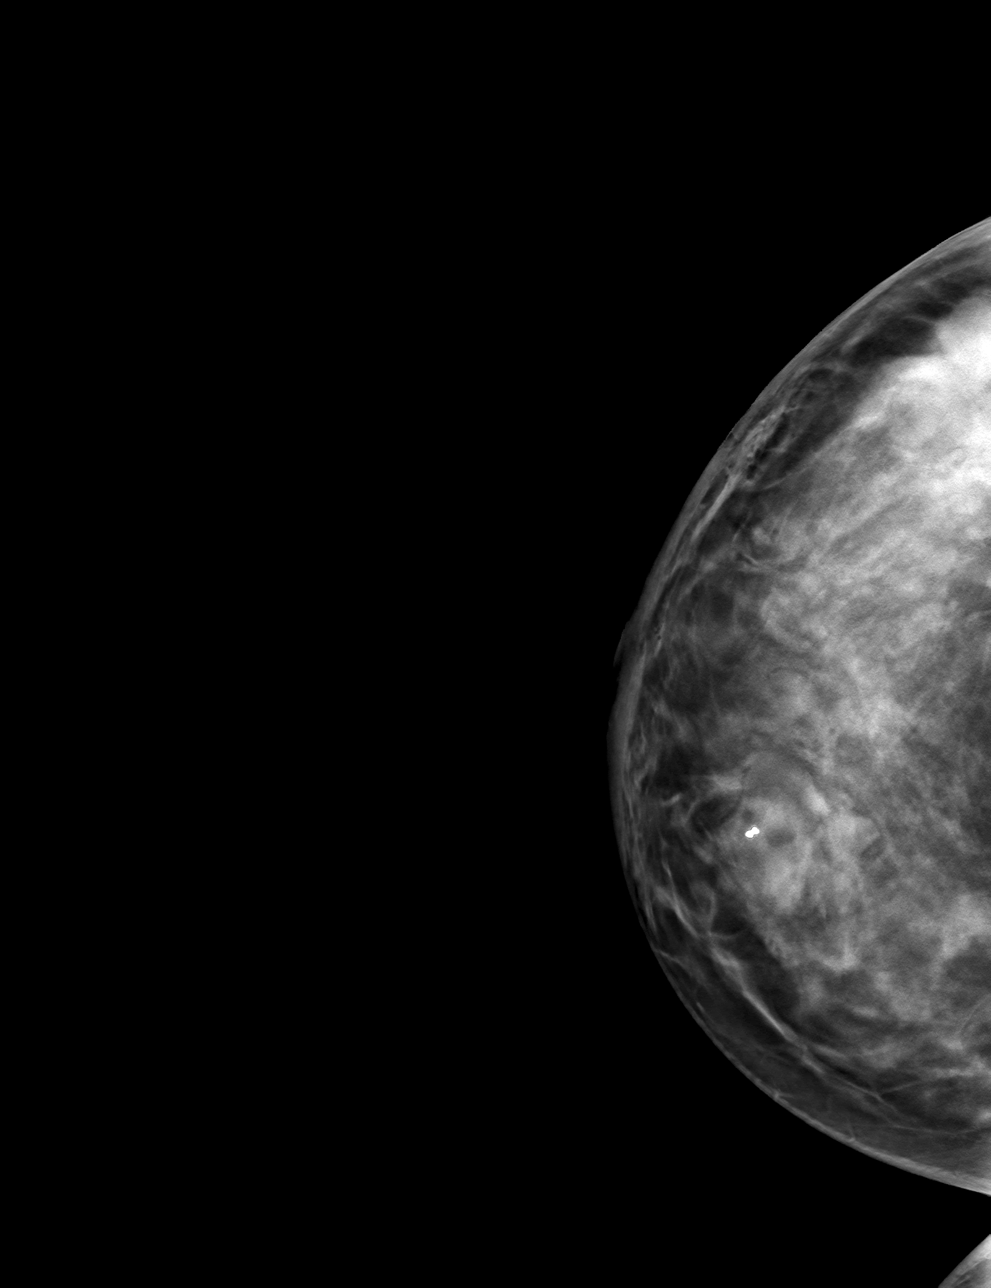

[4 of 12 positions shown; findings below may reference images not displayed]

FINDINGS: 3D Mammographic images were obtained following MRI guided biopsy of
enhancing mass in the UPPER INNER QUADRANT of the RIGHT breast and
placement of a barbell clip. The biopsy marking clip is in expected
position at the site of biopsy. In the region of the clip, a
centimeter hematoma has formed. Pressure dressing was applied.
IMPRESSION: Appropriate positioning of the barbell shaped biopsy marking clip at
the site of biopsy in the UPPER INNER RIGHT breast.

Small hematoma after biopsy.

Final Assessment: Post Procedure Mammograms for Marker Placement

## 2021-07-08 MED ORDER — GADOBUTROL 1 MMOL/ML IV SOLN
7.0000 mL | Freq: Once | INTRAVENOUS | Status: AC | PRN
  Administered 2021-07-08: 7 mL via INTRAVENOUS

## 2021-07-09 ENCOUNTER — Encounter: Payer: Self-pay | Admitting: *Deleted

## 2021-07-12 ENCOUNTER — Telehealth: Payer: Self-pay | Admitting: Genetic Counselor

## 2021-07-12 ENCOUNTER — Encounter: Payer: Self-pay | Admitting: Genetic Counselor

## 2021-07-12 DIAGNOSIS — Z1379 Encounter for other screening for genetic and chromosomal anomalies: Secondary | ICD-10-CM | POA: Insufficient documentation

## 2021-07-12 NOTE — Telephone Encounter (Signed)
Revealed negative genetic testing for BRCAPlus Panel.  Discussed that we do not know why she has breast cancer. It could be sporadic/famillial, due to a change in a gene that she did not inherit, due to a different gene that we are not testing, or maybe our current technology may not be able to pick something up.  It will be important for her to keep in contact with genetics to keep up with whether additional testing may be needed.  Results of pan-cancer panel pending.

## 2021-07-12 NOTE — Telephone Encounter (Signed)
Contacted patient in attempt to disclose results of genetic testing.  LVM with contact information requesting a call back. Language Line interpreter ID: [phone]

## 2021-07-15 ENCOUNTER — Encounter: Payer: Self-pay | Admitting: *Deleted

## 2021-07-18 ENCOUNTER — Inpatient Hospital Stay: Payer: Self-pay | Admitting: Hematology and Oncology

## 2021-07-24 ENCOUNTER — Encounter: Payer: Self-pay | Admitting: Genetic Counselor

## 2021-07-24 ENCOUNTER — Other Ambulatory Visit: Payer: Self-pay

## 2021-07-26 ENCOUNTER — Encounter: Payer: Self-pay | Admitting: *Deleted

## 2021-08-01 ENCOUNTER — Ambulatory Visit: Payer: Self-pay | Admitting: General Surgery

## 2021-08-01 ENCOUNTER — Ambulatory Visit: Payer: Self-pay | Admitting: Genetic Counselor

## 2021-08-01 DIAGNOSIS — C50411 Malignant neoplasm of upper-outer quadrant of right female breast: Secondary | ICD-10-CM

## 2021-08-01 DIAGNOSIS — Z1379 Encounter for other screening for genetic and chromosomal anomalies: Secondary | ICD-10-CM

## 2021-08-01 NOTE — Progress Notes (Signed)
HPI:   Ms. Venning was previously seen in the [place] clinic due to a personal history of breast cancer and concerns regarding a hereditary predisposition to cancer. Please refer to our prior cancer genetics clinic note for more information regarding our discussion, assessment and recommendations, at the time. Ms. Herbert recent genetic test results were disclosed to her, with the assistance of language line interpreter, as were recommendations warranted by these results. These results and recommendations are discussed in more detail below.  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 Indeterminate mass in the right breast at 12:30, which may represent a fibroadenoma. No evidence of right axillary adenopathy.   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  Report dates are Jul 10, 2021 and Jul 18, 2021.   The STAT Breast cancer panel offered by [facility] includes sequencing and rearrangement analysis for  the following 9 genes:  ATM, BRCA1, BRCA2, CDH1, CHEK2, PALB2, PTEN, STK11 and TP53.   The Common Hereditary Cancers + RNA Panel offered by [facility] includes sequencing, deletion/duplication, and RNA testing of the following 47 genes: APC, ATM, AXIN2, BARD1, BMPR1A, BRCA1, BRCA2, BRIP1, CDH1, CDK4*, CDKN2A (p14ARF)*, CDKN2A (p16INK4a)*, CHEK2, CTNNA1, DICER1, EPCAM (Deletion/duplication testing only), GREM1 (promoter region deletion/duplication testing only), KIT, MEN1, MLH1, MSH2, MSH3, MSH6, MUTYH, NBN, NF1, NHTL1, PALB2, PDGFRA*, PMS2, POLD1, POLE, PTEN, RAD50, RAD51C, RAD51D, SDHB, SDHC, SDHD, SMAD4, SMARCA4. STK11, TP53, TSC1, TSC2, and VHL.  The following genes were evaluated for sequence changes only: SDHA and HOXB13 c.251G>A variant only.  RNA analysis is not performed for the * genes.       FAMILY HISTORY:  We obtained a detailed, 4-generation family history.  Significant diagnoses are listed below:      Family History  Problem Relation Age of Onset   Ovarian cancer Other          MGM's niece; d. > 65       Ms. Stahly is unaware of previous family history of genetic testing for hereditary cancer risks. Patient's maternal and paternal ancestors are of [place] descent. There is no reported Ashkenazi Jewish ancestry. There is no known consanguinity.    GENETIC TEST RESULTS:  The [facility] Common Hereditary Cancers +RNA Panel found no pathogenic mutations.   The Common Hereditary Cancers + RNA Panel offered by [facility] includes sequencing, deletion/duplication, and RNA testing of the following 47 genes: APC, ATM, AXIN2, BARD1, BMPR1A, BRCA1, BRCA2, BRIP1, CDH1, CDK4*, CDKN2A (p14ARF)*, CDKN2A (p16INK4a)*, CHEK2, CTNNA1, DICER1, EPCAM (Deletion/duplication testing only), GREM1 (promoter region deletion/duplication testing only), KIT, MEN1, MLH1, MSH2, MSH3, MSH6,  MUTYH, NBN, NF1, NHTL1, PALB2, PDGFRA*, PMS2, POLD1, POLE, PTEN, RAD50, RAD51C, RAD51D, SDHB, SDHC, SDHD, SMAD4, SMARCA4. STK11,  TP53, TSC1, TSC2, and VHL.  The following genes were evaluated for sequence changes only: SDHA and HOXB13 c.251G>A variant only.  RNA analysis is not performed for the * genes.    The test report has been scanned into EPIC and is located under the Molecular Pathology section of the Results Review tab.  A portion of the result report is included below for reference. Genetic testing reported out on Jul 18, 2021.      Even though a pathogenic variant was not identified, possible explanations for the cancer in the family may include: There may be no hereditary risk for cancer in the family. The cancers in Ms. Henrichs and/or her family may be sporadic/familial or due to other genetic and environmental factors. There may be a gene mutation in one of these genes that current testing methods cannot detect but that chance is small. There could be another gene that has not yet been discovered, or that we have not yet tested, that is responsible for the cancer diagnoses in the family.    Therefore, it is important to remain in touch with cancer genetics in the future so that we can continue to offer Ms. Gaugh the most up to date genetic testing.    ADDITIONAL GENETIC TESTING:  We discussed with Ms. Jaycox that her genetic testing was fairly extensive.  If there are additional relevant genes identified to increase cancer risk that can be analyzed in the future, we would be happy to discuss and coordinate this testing at that time.     CANCER SCREENING RECOMMENDATIONS:  Ms. Wardle test result is considered negative (normal).  This means that we have not identified a hereditary cause for her personal history of breast cancer at this time.    An individual's cancer risk and medical management are not determined by genetic test results alone. Overall cancer risk assessment incorporates additional factors, including personal medical history, family history, and any available genetic information that  may result in a personalized plan for cancer prevention and surveillance. Therefore, it is recommended she continue to follow the cancer management and screening guidelines provided by her oncology and primary healthcare provider.  RECOMMENDATIONS FOR FAMILY MEMBERS:   Since she did not inherit a identifiable mutation in a cancer predisposition gene included on this panel, her children could not have inherited a known mutation from her in one of these genes. Individuals in this family might be at some increased risk of developing cancer, over the general population risk, due to the family history of cancer.  Individuals in the family should notify their providers of the family history of cancer. We recommend women in this family have a yearly mammogram beginning at age 45, or 47 years younger than the earliest onset of cancer, an annual clinical breast exam, and perform monthly breast self-exams.    FOLLOW-UP:  Lastly, we discussed with Ms. Varden that cancer genetics is a rapidly advancing field and it is possible that new genetic tests will be appropriate for her and/or her family members in the future. We encouraged her to remain in contact with cancer genetics on an annual basis so we can update her personal and family histories and let her know of advances in cancer genetics that may benefit this family.   Our contact number was provided. Ms. Talsma questions were answered to her satisfaction, and she knows she is  welcome to call us at anytime with additional questions or concerns.   Rosell Khouri M. Joette Catching, [place], [facility] Genetic Counselor [email or web address] (P) [phone]

## 2021-08-01 NOTE — Telephone Encounter (Signed)
Revealed negative pan-cancer panel.    

## 2021-08-02 ENCOUNTER — Encounter: Payer: Self-pay | Admitting: *Deleted

## 2021-08-15 ENCOUNTER — Encounter: Payer: Self-pay | Admitting: *Deleted

## 2021-08-20 ENCOUNTER — Encounter: Payer: Self-pay | Admitting: *Deleted

## 2021-08-22 ENCOUNTER — Encounter: Payer: Self-pay | Admitting: *Deleted

## 2021-09-02 ENCOUNTER — Encounter: Payer: Self-pay | Admitting: Plastic Surgery

## 2021-09-02 ENCOUNTER — Telehealth: Payer: No Typology Code available for payment source | Admitting: Plastic Surgery

## 2021-09-02 ENCOUNTER — Ambulatory Visit (INDEPENDENT_AMBULATORY_CARE_PROVIDER_SITE_OTHER): Payer: Self-pay | Admitting: Plastic Surgery

## 2021-09-02 DIAGNOSIS — C50411 Malignant neoplasm of upper-outer quadrant of right female breast: Secondary | ICD-10-CM

## 2021-09-02 NOTE — Progress Notes (Signed)
   Subjective:    Patient ID: Hannah Murray, female    DOB: 05-Nov-1975, 46 y.o.   MRN: [id number]  The patient is a 46 year old female joining me by phone for further discussion about breast reconstruction.  She has right-sided breast cancer and was trying to decide between a partial mastectomy and full mastectomy.  It is estrogen and progesterone positive and HER2 negative.  She is 5 feet 6 inches tall and weighs 146 pounds.  Her preoperative bra size is a D cup.  She would like to be around about the same size if possible.  She has decided on the right mastectomy.  She would like to go with implant-based reconstruction.   Review of Systems     Objective:   Physical Exam    Assessment & Plan:     ICD-10-CM   1. 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 The patient was at home and I was at the office.  We spent 5 minutes in discussion.   I discussed the limitations of evaluation and management by telemedicine. The patient expressed understanding and agreed to proceed.  Plan for right breast reconstruction with expander and FlexHD.

## 2021-09-03 ENCOUNTER — Encounter: Payer: Self-pay | Admitting: *Deleted

## 2021-09-04 ENCOUNTER — Telehealth: Payer: Self-pay | Admitting: Plastic Surgery

## 2021-09-04 NOTE — Telephone Encounter (Signed)
Left voicemail using interpreter services. We need to notify patient of pre and post op appts

## 2021-09-16 ENCOUNTER — Encounter: Payer: Self-pay | Admitting: *Deleted

## 2021-09-19 ENCOUNTER — Ambulatory Visit (INDEPENDENT_AMBULATORY_CARE_PROVIDER_SITE_OTHER): Payer: Self-pay | Admitting: Surgical

## 2021-09-19 ENCOUNTER — Encounter: Payer: Self-pay | Admitting: Surgical

## 2021-09-19 VITALS — BP 130/76 | HR 75 | Ht 65.0 in | Wt 146.2 lb

## 2021-09-19 DIAGNOSIS — C50411 Malignant neoplasm of upper-outer quadrant of right female breast: Secondary | ICD-10-CM

## 2021-09-19 MED ORDER — ONDANSETRON HCL 4 MG PO TABS: 4.0000 mg | ORAL_TABLET | Freq: Three times a day (TID) | ORAL | 0 refills | Status: DC | PRN

## 2021-09-19 MED ORDER — OXYCODONE HCL 5 MG PO TABS: 5.0000 mg | ORAL_TABLET | Freq: Four times a day (QID) | ORAL | 0 refills | Status: AC | PRN

## 2021-09-19 MED ORDER — DIAZEPAM 2 MG PO TABS: 2.0000 mg | ORAL_TABLET | Freq: Two times a day (BID) | ORAL | 0 refills | Status: DC | PRN

## 2021-09-19 MED ORDER — CEPHALEXIN 500 MG PO CAPS: 500.0000 mg | ORAL_CAPSULE | Freq: Four times a day (QID) | ORAL | 0 refills | Status: AC

## 2021-09-19 NOTE — Progress Notes (Signed)
Patient ID: Hannah Murray, female    DOB: 05/13/1975, 46 y.o.   MRN: [id number]  Chief Complaint  Patient presents with   Pre-op Exam      ICD-10-CM   1. 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  female  with a history of right sided breast cancer.  She presents for preoperative evaluation for upcoming procedure, right breast reconstruction placement of tissue expander and Flex HD, scheduled for 09/30/2021 with Dr.  Marla Roe  Patient has no history or family history of anesthesia. No history of DVT/PE.  No family history of DVT/PE.  No family or personal history of bleeding or clotting disorders.  Patient is not currently taking any blood thinners.  No history of CVA/MI.   Summary of Previous Visit: Patient recently diagnosed with right lateral breast cancer, it is estrogen and progesterone positive, HER2 negative.  She is not a smoker.  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  Patient is feeling well, no recent changes in her health.  She is here today with Spanish interpreter  Past Medical History: Allergies: No Known Allergies  Current Medications:  Current Outpatient Medications:    cephALEXin (KEFLEX) 500 MG capsule, Take 1 capsule (500 mg total) by mouth 4 (four) times daily for 5 days., Disp: 20 capsule, Rfl: 0   diazepam (VALIUM) 2 MG tablet, Take 1 tablet (2 mg total) by mouth every 12 (twelve) hours as needed for muscle spasms., Disp: 20 tablet, Rfl: 0   ondansetron (ZOFRAN) 4 MG tablet, Take 1 tablet (4 mg total) by mouth every 8 (eight) hours as needed for nausea or vomiting., Disp: 20 tablet, Rfl: 0   oxyCODONE (OXY IR/ROXICODONE) 5 MG immediate release tablet, Take 1 tablet (5 mg total) by mouth every 6 (six) hours as needed for up to 5 days  for severe pain., Disp: 20 tablet, Rfl: 0  Past Medical Problems: No past medical history on file.  Past Surgical History: No past surgical history on file.  Social History: Social History   Socioeconomic History   Marital status: Married    Spouse name: Not on file   Number of children: 1   Years of education: Not on file   Highest education level: High school graduate  Occupational History   Not on file  Tobacco Use   Smoking status: Never   Smokeless tobacco: Never  Vaping Use   Vaping Use: Never used  Substance and Sexual Activity   Alcohol use: Yes    Comment: occasionally   Drug use: Never   Sexual activity: Not Currently  Other Topics Concern   Not on file  Social History Narrative   Not on file   Social Determinants of Health   Financial Resource Strain: Not on file  Food Insecurity: No Food Insecurity (04/30/2021)   Hunger Vital Sign    Worried About Running Out of Food in the Last Year: Never true    Ran Out of Food in the Last Year: Never true  Transportation Needs: No Transportation Needs (04/30/2021)   PRAPARE - Hydrologist (Medical): No    Lack of Transportation (Non-Medical): No  Physical Activity: Not on file  Stress: Not on file  Social Connections:  Not on file  Intimate Partner Violence: Not At Risk (07/02/2021)   Humiliation, Afraid, Rape, and Kick questionnaire    Fear of Current or Ex-Partner: No    Emotionally Abused: No    Physically Abused: No    Sexually Abused: No    Family History: Family History  Problem Relation Age of Onset   Hypertension Father    Diabetes Father    Thyroid disease Father    Ovarian cancer Other        MGM's niece; d. > 86    Review of Systems: Review of Systems  Constitutional: Negative.   Respiratory: Negative.    Cardiovascular: Negative.   Gastrointestinal: Negative.   Neurological: Negative.     Physical Exam: Vital Signs BP 130/76 (BP Location: Left Arm, Patient  Position: Sitting, Cuff Size: Small)   Pulse 75   Ht _0  (1.651 m)   Wt 146 lb 3.2 oz (66.3 kg)   SpO2 98%   BMI 24.33 kg/m   Physical Exam Constitutional:      General: Not in acute distress.    Appearance: Normal appearance. Not ill-appearing.  HENT:     Head: Normocephalic and atraumatic.  Eyes:     Pupils: Pupils are equal, round Neck:     Musculoskeletal: Normal range of motion.  Cardiovascular:     Rate and Rhythm: Normal rate    Pulses: Normal pulses.  Pulmonary:     Effort: Pulmonary effort is normal. No respiratory distress.  Musculoskeletal: Normal range of motion.  Skin:    General: Skin is warm and dry.     Findings: No erythema or rash.  Neurological:     General: No focal deficit present.     Mental Status: Alert and oriented to person, place, and time. Mental status is at baseline.     Motor: No weakness.  Psychiatric:        Mood and Affect: Mood normal.        Behavior: Behavior normal.    Assessment/Plan: The patient is scheduled for right breast reconstruction with placement of Flex HD with Dr. Marla Roe.  Risks, benefits, and alternatives of procedure discussed, questions answered and consent obtained.    Smoking Status: Non-smoker; Counseling Given?  N/A  Caprini Score: 6; Risk Factors include: age, hx of breast CA, and length of planned surgery. Recommendation for mechanical prophylaxis. Encourage early ambulation.   Pictures obtained: _1   Post-op Rx sent to pharmacy: Oxycodone, Zofran, Keflex, Valium  Patient was provided with the breast reconstruction and General Surgical Risk consent document and Pain Medication Agreement prior to their appointment.  They had adequate time to read through the risk consent documents and Pain Medication Agreement. We also discussed them in person together during this preop appointment. All of their questions were answered to their satisfaction.  Recommended calling if they have any further questions.   Risk consent form and Pain Medication Agreement to be scanned into patient's chart.  The risks that can be encountered with and after placement of a breast expander placement were discussed and include the following but not limited to these: bleeding, infection, delayed healing, anesthesia risks, skin sensation changes, injury to structures including nerves, blood vessels, and muscles which may be temporary or permanent, allergies to tape, suture materials and glues, blood products, topical preparations or injected agents, skin contour irregularities, skin discoloration and swelling, deep vein thrombosis, cardiac and pulmonary complications, pain, which may persist, fluid accumulation, wrinkling of the skin over the expander, changes  in nipple or breast sensation, expander leakage or rupture, faulty position of the expander, persistent pain, formation of tight scar tissue around the expander (capsular contracture), possible need for revisional surgery or staged procedures.  Patient was provided with [place] and Spanish version of consent form.  Spanish interpreter confirmed that he went through the consent form with her and they completed the form together.  We then reviewed the consent form today in office and I discussed the risks associated with breast reconstruction and placement of tissue expander.   Electronically signed by: Carola Rhine Lansing Sigmon, PA-C 09/19/2021 10:21 AM

## 2021-09-24 ENCOUNTER — Other Ambulatory Visit: Payer: Self-pay

## 2021-09-24 ENCOUNTER — Encounter (HOSPITAL_BASED_OUTPATIENT_CLINIC_OR_DEPARTMENT_OTHER): Payer: Self-pay | Admitting: General Surgery

## 2021-09-24 NOTE — Progress Notes (Signed)
Pre op call done with pt using [employer]. Pt denies any significant health history. Spoke with Abigail Butts RN at [facility] and asked her to review pt's OV note on 08/01/21 with Dr Marlou Starks.  Many significant health issues are included in the note and do not pertain to this pt.  No PAT appt needed, pt.will pick up soap and drink this week.

## 2021-09-26 MED ORDER — CHLORHEXIDINE GLUCONATE CLOTH 2 % EX PADS: 6.0000 | MEDICATED_PAD | Freq: Once | CUTANEOUS | Status: DC

## 2021-09-26 NOTE — Progress Notes (Signed)
       Patient Instructions  The night before surgery:  No food after midnight. ONLY clear liquids after midnight  The day of surgery (if you do NOT have diabetes):  Drink ONE (1) Pre-Surgery Clear Ensure as directed.   This drink was given to you during your hospital  pre-op appointment visit. The pre-op nurse will instruct you on the time to drink the  Pre-Surgery Ensure depending on your surgery time. Finish the drink at the designated time by the pre-op nurse.  Nothing else to drink after completing the  Pre-Surgery Clear Ensure.  The day of surgery (if you have diabetes): Drink ONE (1) Gatorade 2 (G2) as directed. This drink was given to you during your hospital  pre-op appointment visit.  The pre-op nurse will instruct you on the time to drink the   Gatorade 2 (G2) depending on your surgery time. Color of the Gatorade may vary. Red is not allowed. Nothing else to drink after completing the  Gatorade 2 (G2).         If you have questions, please contact your surgeon's office.  Surgical soap given to patient, instructions given, patient verbalized understanding.  

## 2021-09-30 ENCOUNTER — Ambulatory Visit (HOSPITAL_BASED_OUTPATIENT_CLINIC_OR_DEPARTMENT_OTHER): Payer: No Typology Code available for payment source | Admitting: Certified Registered"

## 2021-09-30 ENCOUNTER — Encounter (HOSPITAL_BASED_OUTPATIENT_CLINIC_OR_DEPARTMENT_OTHER): Payer: Self-pay | Admitting: General Surgery

## 2021-09-30 ENCOUNTER — Other Ambulatory Visit: Payer: Self-pay

## 2021-09-30 ENCOUNTER — Observation Stay (HOSPITAL_BASED_OUTPATIENT_CLINIC_OR_DEPARTMENT_OTHER)
Admission: RE | Admit: 2021-09-30 | Discharge: 2021-10-01 | Disposition: A | Discharge status: Home / Self Care | Payer: No Typology Code available for payment source | Attending: Plastic Surgery | Admitting: Plastic Surgery

## 2021-09-30 ENCOUNTER — Encounter (HOSPITAL_BASED_OUTPATIENT_CLINIC_OR_DEPARTMENT_OTHER)
Admission: RE | Disposition: A | Discharge status: Home / Self Care | Payer: Self-pay | Source: Home / Self Care | Attending: Plastic Surgery

## 2021-09-30 DIAGNOSIS — C50411 Malignant neoplasm of upper-outer quadrant of right female breast: Secondary | ICD-10-CM

## 2021-09-30 DIAGNOSIS — Z86718 Personal history of other venous thrombosis and embolism: Secondary | ICD-10-CM | POA: Insufficient documentation

## 2021-09-30 DIAGNOSIS — Z170 Estrogen receptor positive status [ER+]: Secondary | ICD-10-CM

## 2021-09-30 DIAGNOSIS — E119 Type 2 diabetes mellitus without complications: Secondary | ICD-10-CM | POA: Insufficient documentation

## 2021-09-30 DIAGNOSIS — C50919 Malignant neoplasm of unspecified site of unspecified female breast: Secondary | ICD-10-CM | POA: Diagnosis present

## 2021-09-30 DIAGNOSIS — I10 Essential (primary) hypertension: Secondary | ICD-10-CM | POA: Insufficient documentation

## 2021-09-30 DIAGNOSIS — Z01818 Encounter for other preprocedural examination: Secondary | ICD-10-CM

## 2021-09-30 DIAGNOSIS — J45909 Unspecified asthma, uncomplicated: Secondary | ICD-10-CM | POA: Insufficient documentation

## 2021-09-30 HISTORY — PX: MASTECTOMY: SHX3

## 2021-09-30 HISTORY — DX: Other specified health status: Z78.9

## 2021-09-30 HISTORY — PX: MASTECTOMY W/ SENTINEL NODE BIOPSY: SHX2001

## 2021-09-30 HISTORY — PX: BREAST RECONSTRUCTION WITH PLACEMENT OF TISSUE EXPANDER AND FLEX HD (ACELLULAR HYDRATED DERMIS): SHX6295

## 2021-09-30 LAB — POCT PREGNANCY, URINE: Preg Test, Ur: NEGATIVE

## 2021-09-30 SURGERY — MASTECTOMY WITH SENTINEL LYMPH NODE BIOPSY
Anesthesia: General | Site: Breast | Laterality: Right

## 2021-09-30 MED ORDER — HYDROMORPHONE HCL 1 MG/ML IJ SOLN: 1.0000 mg | INTRAMUSCULAR | Status: DC | PRN

## 2021-09-30 MED ORDER — HYDROMORPHONE HCL 1 MG/ML IJ SOLN
INTRAMUSCULAR | Status: AC
  Filled 2021-09-30: qty 0.5

## 2021-09-30 MED ORDER — AMISULPRIDE (ANTIEMETIC) 5 MG/2ML IV SOLN
10.0000 mg | Freq: Once | INTRAVENOUS | Status: AC | PRN
  Administered 2021-09-30: 10 mg via INTRAVENOUS

## 2021-09-30 MED ORDER — ACETAMINOPHEN 500 MG PO TABS
1000.0000 mg | ORAL_TABLET | ORAL | Status: AC
  Administered 2021-09-30: 1000 mg via ORAL

## 2021-09-30 MED ORDER — SENNA 8.6 MG PO TABS: 1.0000 | ORAL_TABLET | Freq: Two times a day (BID) | ORAL | Status: DC

## 2021-09-30 MED ORDER — GABAPENTIN 300 MG PO CAPS
ORAL_CAPSULE | ORAL | Status: AC
  Filled 2021-09-30: qty 1

## 2021-09-30 MED ORDER — FENTANYL CITRATE (PF) 100 MCG/2ML IJ SOLN
INTRAMUSCULAR | Status: AC
  Filled 2021-09-30: qty 2

## 2021-09-30 MED ORDER — PROPOFOL 10 MG/ML IV BOLUS
INTRAVENOUS | Status: AC
  Filled 2021-09-30: qty 20

## 2021-09-30 MED ORDER — HYDROCODONE-ACETAMINOPHEN 5-325 MG PO TABS: 1.0000 | ORAL_TABLET | ORAL | Status: DC | PRN

## 2021-09-30 MED ORDER — CEFAZOLIN SODIUM-DEXTROSE 1-4 GM/50ML-% IV SOLN
1.0000 g | Freq: Three times a day (TID) | INTRAVENOUS | Status: DC
  Administered 2021-09-30 – 2021-10-01 (×2): 1 g via INTRAVENOUS
  Filled 2021-09-30 (×2): qty 50

## 2021-09-30 MED ORDER — PROMETHAZINE HCL 25 MG/ML IJ SOLN: 6.2500 mg | INTRAMUSCULAR | Status: DC | PRN

## 2021-09-30 MED ORDER — CEFAZOLIN SODIUM-DEXTROSE 2-4 GM/100ML-% IV SOLN
INTRAVENOUS | Status: AC
Start: 2021-09-30 — End: ?
  Filled 2021-09-30: qty 100

## 2021-09-30 MED ORDER — SUGAMMADEX SODIUM 200 MG/2ML IV SOLN
INTRAVENOUS | Status: DC | PRN
  Administered 2021-09-30: 200 mg via INTRAVENOUS

## 2021-09-30 MED ORDER — DIAZEPAM 2 MG PO TABS
2.0000 mg | ORAL_TABLET | Freq: Two times a day (BID) | ORAL | Status: DC | PRN
  Administered 2021-09-30: 2 mg via ORAL
  Filled 2021-09-30: qty 1

## 2021-09-30 MED ORDER — ROPIVACAINE HCL 5 MG/ML IJ SOLN
INTRAMUSCULAR | Status: DC | PRN
  Administered 2021-09-30: 30 mL via PERINEURAL

## 2021-09-30 MED ORDER — HYDROMORPHONE HCL 1 MG/ML IJ SOLN
INTRAMUSCULAR | Status: AC
  Filled 2021-09-30: qty 1

## 2021-09-30 MED ORDER — SODIUM CHLORIDE 0.9 % IV SOLN: INTRAVENOUS | Status: DC | PRN

## 2021-09-30 MED ORDER — LACTATED RINGERS IV SOLN
INTRAVENOUS | Status: DC
Start: 2021-09-30 — End: 2021-09-30

## 2021-09-30 MED ORDER — ACETAMINOPHEN 500 MG PO TABS
ORAL_TABLET | ORAL | Status: AC
  Filled 2021-09-30: qty 2

## 2021-09-30 MED ORDER — MIDAZOLAM HCL 2 MG/2ML IJ SOLN
2.0000 mg | Freq: Once | INTRAMUSCULAR | Status: AC
  Administered 2021-09-30: 2 mg via INTRAVENOUS

## 2021-09-30 MED ORDER — DEXAMETHASONE SODIUM PHOSPHATE 10 MG/ML IJ SOLN
INTRAMUSCULAR | Status: AC
  Filled 2021-09-30: qty 1

## 2021-09-30 MED ORDER — IBUPROFEN 200 MG PO TABS
200.0000 mg | ORAL_TABLET | Freq: Four times a day (QID) | ORAL | Status: DC
  Administered 2021-09-30 – 2021-10-01 (×2): 200 mg via ORAL
  Filled 2021-09-30 (×2): qty 1

## 2021-09-30 MED ORDER — POLYETHYLENE GLYCOL 3350 17 G PO PACK: 17.0000 g | PACK | Freq: Every day | ORAL | Status: DC | PRN

## 2021-09-30 MED ORDER — CEFAZOLIN SODIUM-DEXTROSE 2-4 GM/100ML-% IV SOLN: 2.0000 g | INTRAVENOUS | Status: DC

## 2021-09-30 MED ORDER — CELECOXIB 200 MG PO CAPS
ORAL_CAPSULE | ORAL | Status: AC
  Filled 2021-09-30: qty 1

## 2021-09-30 MED ORDER — 0.9 % SODIUM CHLORIDE (POUR BTL) OPTIME
TOPICAL | Status: DC | PRN
  Administered 2021-09-30: 400 mL

## 2021-09-30 MED ORDER — MEPERIDINE HCL 25 MG/ML IJ SOLN: 6.2500 mg | INTRAMUSCULAR | Status: DC | PRN

## 2021-09-30 MED ORDER — PROPOFOL 500 MG/50ML IV EMUL
INTRAVENOUS | Status: DC | PRN
  Administered 2021-09-30: 35 ug/kg/min via INTRAVENOUS

## 2021-09-30 MED ORDER — ONDANSETRON HCL 4 MG/2ML IJ SOLN
INTRAMUSCULAR | Status: AC
  Filled 2021-09-30: qty 2

## 2021-09-30 MED ORDER — ACETAMINOPHEN 325 MG PO TABS
325.0000 mg | ORAL_TABLET | Freq: Four times a day (QID) | ORAL | Status: DC
  Administered 2021-09-30 – 2021-10-01 (×2): 325 mg via ORAL
  Filled 2021-09-30 (×2): qty 1

## 2021-09-30 MED ORDER — FENTANYL CITRATE (PF) 100 MCG/2ML IJ SOLN
INTRAMUSCULAR | Status: DC | PRN
  Administered 2021-09-30 (×2): 50 ug via INTRAVENOUS

## 2021-09-30 MED ORDER — GABAPENTIN 300 MG PO CAPS
300.0000 mg | ORAL_CAPSULE | ORAL | Status: AC
  Administered 2021-09-30: 300 mg via ORAL

## 2021-09-30 MED ORDER — LIDOCAINE HCL (CARDIAC) PF 100 MG/5ML IV SOSY
PREFILLED_SYRINGE | INTRAVENOUS | Status: DC | PRN
  Administered 2021-09-30: 100 mg via INTRAVENOUS

## 2021-09-30 MED ORDER — METHYLENE BLUE 1 % INJ SOLN
INTRAVENOUS | Status: AC
  Filled 2021-09-30: qty 10

## 2021-09-30 MED ORDER — EPHEDRINE SULFATE (PRESSORS) 50 MG/ML IJ SOLN
INTRAMUSCULAR | Status: DC | PRN
  Administered 2021-09-30: 10 mg via INTRAVENOUS

## 2021-09-30 MED ORDER — PROPOFOL 10 MG/ML IV BOLUS
INTRAVENOUS | Status: AC
Start: 2021-09-30 — End: ?
  Filled 2021-09-30: qty 20

## 2021-09-30 MED ORDER — ROCURONIUM BROMIDE 100 MG/10ML IV SOLN
INTRAVENOUS | Status: DC | PRN
  Administered 2021-09-30: 70 mg via INTRAVENOUS
  Administered 2021-09-30: 20 mg via INTRAVENOUS

## 2021-09-30 MED ORDER — LIDOCAINE 2% (20 MG/ML) 5 ML SYRINGE
INTRAMUSCULAR | Status: AC
  Filled 2021-09-30: qty 5

## 2021-09-30 MED ORDER — ROCURONIUM BROMIDE 10 MG/ML (PF) SYRINGE
PREFILLED_SYRINGE | INTRAVENOUS | Status: AC
  Filled 2021-09-30: qty 10

## 2021-09-30 MED ORDER — DEXAMETHASONE SODIUM PHOSPHATE 4 MG/ML IJ SOLN
INTRAMUSCULAR | Status: DC | PRN
  Administered 2021-09-30: 4 mg via INTRAVENOUS

## 2021-09-30 MED ORDER — HYDROMORPHONE HCL 1 MG/ML IJ SOLN
INTRAMUSCULAR | Status: DC | PRN
  Administered 2021-09-30 (×2): .25 mg via INTRAVENOUS

## 2021-09-30 MED ORDER — CELECOXIB 200 MG PO CAPS
200.0000 mg | ORAL_CAPSULE | ORAL | Status: AC
  Administered 2021-09-30: 200 mg via ORAL

## 2021-09-30 MED ORDER — SODIUM CHLORIDE (PF) 0.9 % IJ SOLN
INTRAMUSCULAR | Status: AC
  Filled 2021-09-30: qty 10

## 2021-09-30 MED ORDER — HYDROMORPHONE HCL 1 MG/ML IJ SOLN
0.2500 mg | INTRAMUSCULAR | Status: DC | PRN
  Administered 2021-09-30 (×2): 0.5 mg via INTRAVENOUS

## 2021-09-30 MED ORDER — OXYCODONE HCL 5 MG PO TABS: 5.0000 mg | ORAL_TABLET | Freq: Once | ORAL | Status: DC | PRN

## 2021-09-30 MED ORDER — ONDANSETRON 4 MG PO TBDP: 4.0000 mg | ORAL_TABLET | Freq: Four times a day (QID) | ORAL | Status: DC | PRN

## 2021-09-30 MED ORDER — FENTANYL CITRATE (PF) 100 MCG/2ML IJ SOLN
100.0000 ug | Freq: Once | INTRAMUSCULAR | Status: AC
  Administered 2021-09-30: 100 ug via INTRAVENOUS

## 2021-09-30 MED ORDER — SODIUM CHLORIDE 0.9 % IV SOLN
INTRAVENOUS | Status: AC | PRN
  Administered 2021-09-30: 150 mL via INTRAMUSCULAR

## 2021-09-30 MED ORDER — AMISULPRIDE (ANTIEMETIC) 5 MG/2ML IV SOLN
INTRAVENOUS | Status: AC
  Filled 2021-09-30: qty 4

## 2021-09-30 MED ORDER — MIDAZOLAM HCL 2 MG/2ML IJ SOLN
INTRAMUSCULAR | Status: AC
  Filled 2021-09-30: qty 2

## 2021-09-30 MED ORDER — ONDANSETRON HCL 4 MG/2ML IJ SOLN: 4.0000 mg | Freq: Four times a day (QID) | INTRAMUSCULAR | Status: DC | PRN

## 2021-09-30 MED ORDER — ONDANSETRON HCL 4 MG/2ML IJ SOLN
INTRAMUSCULAR | Status: DC | PRN
  Administered 2021-09-30: 4 mg via INTRAVENOUS

## 2021-09-30 MED ORDER — OXYCODONE HCL 5 MG/5ML PO SOLN: 5.0000 mg | Freq: Once | ORAL | Status: DC | PRN

## 2021-09-30 MED ORDER — KCL IN DEXTROSE-NACL 20-5-0.45 MEQ/L-%-% IV SOLN
INTRAVENOUS | Status: DC
  Filled 2021-09-30: qty 1000

## 2021-09-30 MED ORDER — MAGTRACE LYMPHATIC TRACER
INTRAMUSCULAR | Status: DC | PRN
  Administered 2021-09-30: 2 mL via INTRAMUSCULAR

## 2021-09-30 MED ORDER — PROPOFOL 10 MG/ML IV BOLUS
INTRAVENOUS | Status: DC | PRN
  Administered 2021-09-30: 150 mg via INTRAVENOUS

## 2021-09-30 MED ORDER — CEFAZOLIN SODIUM-DEXTROSE 2-4 GM/100ML-% IV SOLN
2.0000 g | INTRAVENOUS | Status: AC
  Administered 2021-09-30: 2 g via INTRAVENOUS

## 2021-09-30 MED ORDER — SODIUM CHLORIDE 0.9 % IV SOLN
INTRAVENOUS | Status: AC
  Filled 2021-09-30: qty 10

## 2021-09-30 SURGICAL SUPPLY — 88 items
APPLIER CLIP 9.375 MED OPEN (MISCELLANEOUS) ×3
BAG DECANTER FOR FLEXI CONT (MISCELLANEOUS) ×3 IMPLANT
BINDER BREAST LRG (GAUZE/BANDAGES/DRESSINGS) ×1 IMPLANT
BINDER BREAST MEDIUM (GAUZE/BANDAGES/DRESSINGS) IMPLANT
BINDER BREAST XLRG (GAUZE/BANDAGES/DRESSINGS) IMPLANT
BINDER BREAST XXLRG (GAUZE/BANDAGES/DRESSINGS) IMPLANT
BIOPATCH RED 1 DISK 7.0 (GAUZE/BANDAGES/DRESSINGS) ×3 IMPLANT
BLADE HEX COATED 2.75 (ELECTRODE) ×2 IMPLANT
BLADE SURG 10 STRL SS (BLADE) ×4 IMPLANT
BLADE SURG 15 STRL LF DISP TIS (BLADE) ×2 IMPLANT
BLADE SURG 15 STRL SS (BLADE) ×2
BNDG GAUZE DERMACEA FLUFF (GAUZE/BANDAGES/DRESSINGS) ×2
BNDG GAUZE DERMACEA FLUFF 4 (GAUZE/BANDAGES/DRESSINGS) ×4 IMPLANT
CANISTER SUCT 1200ML W/VALVE (MISCELLANEOUS) ×3 IMPLANT
CHLORAPREP W/TINT 26 (MISCELLANEOUS) ×4 IMPLANT
CLIP APPLIE 9.375 MED OPEN (MISCELLANEOUS) ×2 IMPLANT
COVER BACK TABLE 60X90IN (DRAPES) ×3 IMPLANT
COVER MAYO STAND STRL (DRAPES) ×4 IMPLANT
COVER PROBE W GEL 5X96 (DRAPES) ×3 IMPLANT
DERMABOND ADVANCED (GAUZE/BANDAGES/DRESSINGS) ×1
DERMABOND ADVANCED .7 DNX12 (GAUZE/BANDAGES/DRESSINGS) ×2 IMPLANT
DEVICE DSSCT PLSMBLD 3.0S LGHT (MISCELLANEOUS) ×2 IMPLANT
DRAIN CHANNEL 19F RND (DRAIN) ×3 IMPLANT
DRAPE LAPAROSCOPIC ABDOMINAL (DRAPES) ×3 IMPLANT
DRAPE UTILITY XL STRL (DRAPES) ×3 IMPLANT
DRSG OPSITE POSTOP 4X6 (GAUZE/BANDAGES/DRESSINGS) ×1 IMPLANT
DRSG PAD ABDOMINAL 8X10 ST (GAUZE/BANDAGES/DRESSINGS) ×6 IMPLANT
DRSG TEGADERM 2-3/8X2-3/4 SM (GAUZE/BANDAGES/DRESSINGS) ×3 IMPLANT
ELECT BLADE 4.0 EZ CLEAN MEGAD (MISCELLANEOUS) ×3
ELECT COATED BLADE 2.86 ST (ELECTRODE) ×1 IMPLANT
ELECT REM PT RETURN 9FT ADLT (ELECTROSURGICAL) ×6
ELECTRODE BLDE 4.0 EZ CLN MEGD (MISCELLANEOUS) ×2 IMPLANT
ELECTRODE REM PT RTRN 9FT ADLT (ELECTROSURGICAL) ×2 IMPLANT
EVACUATOR SILICONE 100CC (DRAIN) ×3 IMPLANT
FUNNEL KELLER 2 DISP (MISCELLANEOUS) IMPLANT
GAUZE SPONGE 4X4 12PLY STRL LF (GAUZE/BANDAGES/DRESSINGS) ×3 IMPLANT
GLOVE BIO SURGEON STRL SZ 6.5 (GLOVE) ×7 IMPLANT
GLOVE BIO SURGEON STRL SZ7.5 (GLOVE) ×5 IMPLANT
GLOVE BIOGEL PI IND STRL 7.0 (GLOVE) IMPLANT
GLOVE BIOGEL PI INDICATOR 7.0 (GLOVE) ×3
GLOVE SURG SS PI 6.5 STRL IVOR (GLOVE) ×1 IMPLANT
GLOVE SURG SS PI 7.0 STRL IVOR (GLOVE) ×1 IMPLANT
GOWN STRL REUS W/ TWL LRG LVL3 (GOWN DISPOSABLE) ×6 IMPLANT
GOWN STRL REUS W/TWL LRG LVL3 (GOWN DISPOSABLE) ×6
GRAFT FLEX HD 6X16 PLIABLE (Tissue) ×1 IMPLANT
ILLUMINATOR WAVEGUIDE N/F (MISCELLANEOUS) IMPLANT
IMPL EXPANDER BREAST 535CC (Breast) IMPLANT
IMPLANT EXPANDER BREAST 535CC (Breast) ×3 IMPLANT
IV NS 1000ML (IV SOLUTION)
IV NS 1000ML BAXH (IV SOLUTION) IMPLANT
IV NS 500ML (IV SOLUTION) ×1
IV NS 500ML BAXH (IV SOLUTION) ×2 IMPLANT
KIT FILL ASEPTIC TRANSFER (MISCELLANEOUS) ×1 IMPLANT
LIGHT WAVEGUIDE WIDE FLAT (MISCELLANEOUS) IMPLANT
NDL HYPO 25X1 1.5 SAFETY (NEEDLE) ×2 IMPLANT
NEEDLE HYPO 25X1 1.5 SAFETY (NEEDLE) ×6 IMPLANT
NS IRRIG 1000ML POUR BTL (IV SOLUTION) ×3 IMPLANT
PACK BASIN DAY SURGERY FS (CUSTOM PROCEDURE TRAY) ×3 IMPLANT
PAD FOAM SILICONE BACKED (GAUZE/BANDAGES/DRESSINGS) IMPLANT
PENCIL SMOKE EVACUATOR (MISCELLANEOUS) ×3 IMPLANT
PIN SAFETY STERILE (MISCELLANEOUS) ×3 IMPLANT
PLASMABLADE 3.0S W/LIGHT (MISCELLANEOUS) ×3
SLEEVE SCD COMPRESS KNEE MED (STOCKING) ×3 IMPLANT
SPIKE FLUID TRANSFER (MISCELLANEOUS) IMPLANT
SPONGE T-LAP 18X18 ~~LOC~~+RFID (SPONGE) ×6 IMPLANT
STRIP SUTURE WOUND CLOSURE 1/2 (MISCELLANEOUS) ×2 IMPLANT
SUT ETHILON 2 0 FS 18 (SUTURE) ×2 IMPLANT
SUT MNCRL AB 4-0 PS2 18 (SUTURE) ×4 IMPLANT
SUT MON AB 3-0 SH 27 (SUTURE) ×1
SUT MON AB 3-0 SH27 (SUTURE) ×2 IMPLANT
SUT MON AB 5-0 PS2 18 (SUTURE) IMPLANT
SUT PDS 3-0 CT2 (SUTURE) ×3
SUT PDS AB 2-0 CT2 27 (SUTURE) ×6 IMPLANT
SUT PDS II 3-0 CT2 27 ABS (SUTURE) IMPLANT
SUT SILK 2 0 SH (SUTURE) IMPLANT
SUT SILK 3 0 PS 1 (SUTURE) ×3 IMPLANT
SUT VIC AB 3-0 SH 27 (SUTURE)
SUT VIC AB 3-0 SH 27X BRD (SUTURE) IMPLANT
SUT VICRYL 3-0 CR8 SH (SUTURE) ×3 IMPLANT
SYR BULB IRRIG 60ML STRL (SYRINGE) ×3 IMPLANT
SYR CONTROL 10ML LL (SYRINGE) ×4 IMPLANT
TOWEL GREEN STERILE FF (TOWEL DISPOSABLE) ×6 IMPLANT
TRACER MAGTRACE VIAL (MISCELLANEOUS) ×1 IMPLANT
TRAY DSU PREP LF (CUSTOM PROCEDURE TRAY) ×2 IMPLANT
TRAY FOLEY W/BAG SLVR 14FR LF (SET/KITS/TRAYS/PACK) IMPLANT
TUBE CONNECTING 20X1/4 (TUBING) ×3 IMPLANT
UNDERPAD 30X36 HEAVY ABSORB (UNDERPADS AND DIAPERS) ×6 IMPLANT
YANKAUER SUCT BULB TIP NO VENT (SUCTIONS) ×3 IMPLANT

## 2021-09-30 NOTE — Anesthesia Procedure Notes (Signed)
Procedure Name: Intubation Date/Time: 09/30/2021 2:38 PM  Performed by: Verita Lamb, CRNAPre-anesthesia Checklist: Patient identified, Emergency Drugs available, Suction available and Patient being monitored Patient Re-evaluated:Patient Re-evaluated prior to induction Oxygen Delivery Method: Circle system utilized Preoxygenation: Pre-oxygenation with 100% oxygen Induction Type: IV induction Ventilation: Mask ventilation without difficulty Laryngoscope Size: Miller and 3 Grade View: Grade I Tube type: Oral Tube size: 7.0 mm Number of attempts: 1 Airway Equipment and Method: Stylet and Oral airway Placement Confirmation: ETT inserted through vocal cords under direct vision, positive ETCO2, breath sounds checked- equal and bilateral and CO2 detector Secured at: 23 cm Tube secured with: Tape Dental Injury: Teeth and Oropharynx as per pre-operative assessment

## 2021-09-30 NOTE — Anesthesia Procedure Notes (Signed)
Anesthesia Regional Block: Pectoralis block   Pre-Anesthetic Checklist: , timeout performed,  Correct Patient, Correct Site, Correct Laterality,  Correct Procedure, Correct Position, site marked,  Risks and benefits discussed,  Surgical consent,  Pre-op evaluation,  At surgeon's request and post-op pain management  Laterality: Right  Prep: chloraprep       Needles:  Injection technique: Single-shot  Needle Type: Stimiplex     Needle Length: 9cm  Needle Gauge: 21     Additional Needles:   Procedures:,,,, ultrasound used (permanent image in chart),,    Narrative:  Start time: 09/30/2021 12:34 PM End time: 09/30/2021 12:39 PM Injection made incrementally with aspirations every 5 mL.  Performed by: Personally  Anesthesiologist: Lynda Rainwater, MD

## 2021-09-30 NOTE — Transfer of Care (Signed)
Immediate Anesthesia Transfer of Care Note  Patient: Hannah Murray  Procedure(s) Performed: RIGHT MASTECTOMY WITH SENTINEL LYMPH NODE BIOPSY (Right: Breast) BREAST RECONSTRUCTION WITH PLACEMENT OF TISSUE EXPANDER AND FLEX HD (ACELLULAR HYDRATED DERMIS) (Right)  Patient Location: PACU  Anesthesia Type:General and Regional  Level of Consciousness: awake, drowsy and patient cooperative  Airway & Oxygen Therapy: Patient Spontanous Breathing and Patient connected to face mask oxygen  Post-op Assessment: Report given to RN and Post -op Vital signs reviewed and stable  Post vital signs: Reviewed and stable  Last Vitals:  Vitals Value Taken Time  BP 115/65 09/30/21 1533  Temp    Pulse 80 09/30/21 1535  Resp 14 09/30/21 1535  SpO2 96 % 09/30/21 1535  Vitals shown include unvalidated device data.  Last Pain:  Vitals:   09/30/21 1129  PainSc: 0-No pain      Patients Stated Pain Goal: 3 (27/25/36 6440)  Complications: No notable events documented.

## 2021-09-30 NOTE — Op Note (Addendum)
Op report    DATE OF OPERATION:  09/30/2021  LOCATION: [facility]  SURGICAL DIVISION: Plastic Surgery  PREOPERATIVE DIAGNOSES:  1.  Upper outer quadrant right breast cancer.    POSTOPERATIVE DIAGNOSES:  1. Upper outer quadrant right breast cancer.   PROCEDURE:  1. Right immediate breast reconstruction with placement of Acellular Dermal Matrix and tissue expanders.  SURGEON: Flemon Kelty Sanger Press Casale, DO  ASSISTANT: Donnamarie Rossetti, PA  ANESTHESIA:  General.   COMPLICATIONS: None.   IMPLANTS: Right - Mentor 535 cc. Ref #[id number].  150 cc of injectable saline placed in the expander. Acellular Dermal Matrix 6 x 16 cm Flex HD  INDICATIONS FOR PROCEDURE:  The patient, Hannah Murray, is a 46 y.o. female born on [birth year], is here for  immediate first stage breast reconstruction with placement of a right tissue expander and Acellular dermal matrix. MRN: [id number]  CONSENT:  Informed consent was obtained directly from the patient. Risks, benefits and alternatives were fully discussed. Specific risks including but not limited to bleeding, infection, hematoma, seroma, scarring, pain, implant infection, implant extrusion, capsular contracture, asymmetry, wound healing problems, and need for further surgery were all discussed. The patient did have an ample opportunity to have her questions answered to her satisfaction.   DESCRIPTION OF PROCEDURE:  The patient was taken to the operating room by the general surgery team. SCDs were placed and IV antibiotics were given. The patient's chest was prepped and draped in a sterile fashion. A time out was performed and the implants to be used were identified.  A right mastectomy was performed.  Once the general surgery team had completed their portion of the case the patient was rendered to the plastic and reconstructive surgery team.  Right:  The pectoralis major muscle was lifted from the chest wall with release of the  lateral edge and lateral inframammary fold.  The pocket was irrigated with antibiotic solution and hemostasis was achieved with electrocautery.  The ADM was then prepared according to the manufacture guidelines and slits placed to help with postoperative fluid management.  The ADM was then sutured to the inferior and lateral edge of the inframammary fold with 2-0 PDS starting with an interrupted stitch and then a running stitch.  The lateral portion was sutured to with interrupted sutures after the expander was placed.  The expander was prepared according to the manufacture guidelines, the air evacuated and then it was placed under the ADM and pectoralis major muscle.  The inferior and lateral tabs were used to secure the expander to the chest wall with 2-0 PDS.  The drain was placed at the inframammary fold over the ADM and secured to the skin with 3-0 Silk.    The deep layers were closed with 3-0 PDS and 3-0 Monocryl followed by 4-0 Monocryl.  The skin was closed with 5-0 Monocryl and then dermabond was applied.  The ABDs and breast binder were placed.  The patient tolerated the procedure well and there were no complications.  The patient was allowed to wake from anesthesia and taken to the recovery room in satisfactory condition.   The advanced practice practitioner (APP) assisted throughout the case.  The APP was essential in retraction and counter traction when needed to make the case progress smoothly.  This retraction and assistance made it possible to see the tissue plans for the procedure.  The assistance was needed for blood control, tissue re-approximation and assisted with closure of the incision site.

## 2021-09-30 NOTE — Progress Notes (Signed)
Assisted Dr. Miller with right, pectoralis, ultrasound guided block. Side rails up, monitors on throughout procedure. See vital signs in flow sheet. Tolerated Procedure well. 

## 2021-09-30 NOTE — Interval H&P Note (Signed)
History and Physical Interval Note:  09/30/2021 12:42 PM  Hannah Murray  has presented today for surgery, with the diagnosis of RIGHT BREAST CANCER.  The various methods of treatment have been discussed with the patient and family. After consideration of risks, benefits and other options for treatment, the patient has consented to  Procedure(s): RIGHT MASTECTOMY WITH SENTINEL LYMPH NODE BIOPSY (Right) BREAST RECONSTRUCTION WITH PLACEMENT OF TISSUE EXPANDER AND FLEX HD (ACELLULAR HYDRATED DERMIS) (Right) as a surgical intervention.  The patient's history has been reviewed, patient examined, no change in status, stable for surgery.  I have reviewed the patient's chart and labs.  Questions were answered to the patient's satisfaction.     [name]

## 2021-09-30 NOTE — Op Note (Signed)
09/30/2021  2:33 PM  PATIENT:  Hannah Murray  46 y.o. female  PRE-OPERATIVE DIAGNOSIS:  RIGHT BREAST CANCER  POST-OPERATIVE DIAGNOSIS:  RIGHT BREAST CANCER  PROCEDURE:  Procedure(s): RIGHT MASTECTOMY WITH DEEP RIGHT AXILLARY SENTINEL LYMPH NODE BIOPSY (Right)  SURGEON:  Surgeon(s) and Role: Panel 1:    * Jovita Kussmaul, MD - Primary  PHYSICIAN ASSISTANT:   ASSISTANTS: none   ANESTHESIA:   general  EBL:  Minimal   BLOOD ADMINISTERED:none  DRAINS: none   LOCAL MEDICATIONS USED:  NONE  SPECIMEN:  Source of Specimen:  right mastectomy and sentinel node, additional lateral skin closest to tumor  DISPOSITION OF SPECIMEN:  PATHOLOGY  COUNTS:  YES  TOURNIQUET:  * No tourniquets in log *  DICTATION: .Dragon Dictation  After informed consent was obtained the patient was brought to the operating room and placed in the supine position on the operating table.  After adequate induction of general anesthesia the patient's bilateral chest, breast, and axillary areas were prepped with ChloraPrep, allowed to dry, draped in usual sterile manner.  An appropriate timeout was performed.  At this point 2 cc of iron oxide were injected in the subareolar plexus on the right and the breast was massaged for 5 minutes.  An elliptical incision was then made around the nipple and areola complex in order to spare as much skin as possible.  The incision was carried through the skin and subcutaneous tissue sharply with the PlasmaBlade.  Breast hooks were used to elevate the skin flaps anteriorly towards the ceiling.  Then skin flaps were then created by dissecting between the breast tissue and the subcutaneous fat and skin.  This dissection was carried circumferentially all the way to the muscle of the chest wall.  Laterally the skin was very thin as the tumor was fairly superficial.  I ended up taking an additional portion of skin laterally overlying the closest point of the tumor and this was marked with a  stitch on the medial edge and sent to pathology.  The breast was then separated from the pectoralis muscle with the pectoralis fascia.  Once this dissection was accomplished then the entire right breast was removed from the patient.  It was marked with a stitch on the lateral skin and sent to pathology for further evaluation.  Hemostasis was achieved using the PlasmaBlade.  The mag trace was then used to identify a signal in the deep right axillary space.  I used the mag trace to direct blunt hemostat dissection.  Once I identified the lymph node I then excised the lymph node sharply with the PlasmaBlade and controlled the surrounding small vessels and lymphatics with clips.  There were likely 2 or 3 lymph nodes at least in the specimen.  All of them had some level of signal.  This was sent as sentinel node #1.  No other hot or palpable nodes were identified in the right axilla.  The dissection in the axilla at this point was getting close to the thoracodorsal nerve and axillary vein.  At this point the operation was turned over to Dr. Marla Roe for the reconstruction.  Her portion will be dictated separately.  The patient was tolerating the operation well.  All needle sponge and instrument counts were correct.  PLAN OF CARE: Admit for overnight observation  PATIENT DISPOSITION:  PACU - hemodynamically stable.   Delay start of Pharmacological VTE agent (>24hrs) due to surgical blood loss or risk of bleeding: no

## 2021-09-30 NOTE — Anesthesia Postprocedure Evaluation (Signed)
Anesthesia Post Note  Patient: Materials engineer  Procedure(s) Performed: RIGHT MASTECTOMY WITH SENTINEL LYMPH NODE BIOPSY (Right: Breast) BREAST RECONSTRUCTION WITH PLACEMENT OF TISSUE EXPANDER AND FLEX HD (ACELLULAR HYDRATED DERMIS) (Right)     Patient location during evaluation: PACU Anesthesia Type: General Level of consciousness: awake and alert Pain management: pain level controlled Vital Signs Assessment: post-procedure vital signs reviewed and stable Respiratory status: spontaneous breathing, nonlabored ventilation and respiratory function stable Cardiovascular status: blood pressure returned to baseline and stable Postop Assessment: no apparent nausea or vomiting Anesthetic complications: no   No notable events documented.  Last Vitals:  Vitals:   09/30/21 1545 09/30/21 1600  BP: 120/75 129/75  Pulse: 83 75  Resp: 16 14  Temp:    SpO2: 100% 100%    Last Pain:  Vitals:   09/30/21 1545  PainSc: Asleep                 Lynda Rainwater

## 2021-09-30 NOTE — H&P (Signed)
PROVIDER: Landry Corporal, MD  MRN: [id number] DOB: 1975-09-14 Subjective   Chief Complaint: Follow-up   History of Present Illness: Hannah Murray is a 46 y.o. female who is seen today as an office consultation for evaluation of Follow-up .   We are asked to see the patient in consultation by Dr. Vickii Chafe constant to evaluate her for a new right breast cancer. The patient is a 46 year old Hispanic female who recently went for a routine screening mammogram. At that time she was found to have a 3.6 cm mass in the upper outer quadrant of the right breast. 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 The lymph nodes looked normal. She has no family history of breast cancer. She is otherwise in good health and does not smoke. The MRI estimated the size of the cancer to be 4 cm and appeared to involve a good portion of the lateral portion of the right breast  Review of Systems: A complete review of systems was obtained from the patient. I have reviewed this information and discussed as appropriate with the patient. See HPI as well for other ROS.  ROS   Medical History: Past Medical History:  Diagnosis Date  Anemia  Aneurysm (CMS-HCC)  Anxiety  Arrhythmia  Arthritis  Asthma, unspecified asthma severity, unspecified whether complicated, unspecified whether persistent  Diabetes mellitus without complication (CMS-HCC)  DVT (deep venous thrombosis) (CMS-HCC)  End-stage liver disease (CMS-HCC)  Esophageal varices (CMS-HCC)  GERD (gastroesophageal reflux disease)  Glaucoma (increased eye pressure)  Heart valve disease  History of cancer  Hyperlipidemia  Hypertension   Patient Active Problem List  Diagnosis  Malignant neoplasm of upper-outer quadrant of right breast in female, estrogen receptor positive (CMS-HCC)   History reviewed. No pertinent surgical history.   No Known Allergies  No current  outpatient medications on file prior to visit.   No current facility-administered medications on file prior to visit.   Family History  Problem Relation Age of Onset  Diabetes Mother    Social History   Tobacco Use  Smoking Status Never  Smokeless Tobacco Never    Social History   Socioeconomic History  Marital status: Married  Tobacco Use  Smoking status: Never  Smokeless tobacco: Never  Vaping Use  Vaping Use: Never used  Substance and Sexual Activity  Alcohol use: Never  Drug use: Never   Objective:   There were no vitals filed for this visit.  There is no height or weight on file to calculate BMI.  Physical Exam Vitals reviewed.  Constitutional:  General: She is not in acute distress. Appearance: Normal appearance.  HENT:  Head: Normocephalic and atraumatic.  Right Ear: External ear normal.  Left Ear: External ear normal.  Nose: Nose normal.  Mouth/Throat:  Mouth: Mucous membranes are moist.  Pharynx: Oropharynx is clear.  Eyes:  General: No scleral icterus. Extraocular Movements: Extraocular movements intact.  Conjunctiva/sclera: Conjunctivae normal.  Pupils: Pupils are equal, round, and reactive to light.  Cardiovascular:  Rate and Rhythm: Normal rate and regular rhythm.  Pulses: Normal pulses.  Heart sounds: Normal heart sounds.  Pulmonary:  Effort: Pulmonary effort is normal. No respiratory distress.  Breath sounds: Normal breath sounds.  Abdominal:  General: Bowel sounds are normal.  Palpations: Abdomen is soft.  Tenderness: There is no abdominal tenderness.  Musculoskeletal:  General: No swelling, tenderness or deformity. Normal range of motion.  Cervical back: Normal  range of motion and neck supple.  Skin: General: Skin is warm and dry.  Coloration: Skin is not jaundiced.  Neurological:  General: No focal deficit present.  Mental Status: She is alert and oriented to person, place, and time.  Psychiatric:  Mood and Affect: Mood  normal.  Behavior: Behavior normal.    Breast: There is a palpable bruise in the outer right breast. Other than this there is no palpable mass in either breast. There is no palpable axillary, supraclavicular, or cervical lymphadenopathy.  Labs, Imaging and Diagnostic Testing:  Assessment and Plan:   Diagnoses and all orders for this visit:  Malignant neoplasm of upper-outer quadrant of right breast in female, estrogen receptor positive (CMS-HCC)   The patient appears to have a 4 cm cancer in the upper outer quadrant of the right breast. Given the size of her breast size my recommendation would be for mastectomy. She would also be a good candidate for sentinel node biopsy. I have discussed with her in detail the risks and benefits of the operation as well as some of the technical aspects and she understands and wishes to proceed. We will coordinate with plastic surgery for the reconstruction.

## 2021-09-30 NOTE — Discharge Instructions (Signed)
INSTRUCTIONS FOR AFTER BREAST SURGERY   You will likely have some questions about what to expect following your operation.  The following information will help you and your family understand what to expect when you are discharged from the hospital.  Following these guidelines will help ensure a smooth recovery and reduce risks of complications.  Postoperative instructions include information on: diet, wound care, medications and physical activity.  AFTER SURGERY Expect to go home after the procedure.  In some cases, you may need to spend one night in the hospital for observation.  DIET Breast surgery does not require a specific diet.  However, the healthier you eat the better your body can start healing. It is important to increasing your protein intake.  This means limiting the foods with sugar and carbohydrates.  Focus on vegetables and some meat.  If you have any liposuction during your procedure be sure to drink water.  If your urine is bright yellow, then it is concentrated, and you need to drink more water.  As a general rule after surgery, you should have 8 ounces of water every hour while awake.  If you find you are persistently nauseated or unable to take in liquids let us know.  NO TOBACCO USE or EXPOSURE.  This will slow your healing process and increase the risk of a wound.  WOUND CARE Leave the binder on for 3 days . Use fragrance free soap.   After 3 days you can remove the binder to shower. Once dry apply ACE wrap, binder or sports bra.  Use a mild soap like Dial, Dove and [place]. You may have Topifoam or Lipofoam on.  It is soft and spongy and helps keep you from getting creases if you have liposuction.  This can be removed before the shower and then replaced.  If you need more it is available on [employer] (Lipofoam). If you have steri-strips / tape directly attached to your skin leave them in place. It is OK to get these wet.   No baths, pools or hot tubs for four weeks. We close your  incision to leave the smallest and best-looking scar. No ointment or creams on your incisions until given the go ahead.  Especially not Neosporin (Too many skin reactions with this one).  A few weeks after surgery you can use Mederma and start massaging the scar. We ask you to wear your binder or sports bra for the first 6 weeks around the clock, including while sleeping. This provides added comfort and helps reduce the fluid accumulation at the surgery site.  ACTIVITY No heavy lifting until cleared by the doctor.  This usually means no more than a half-gallon of milk.  It is OK to walk and climb stairs. In fact, moving your legs is very important to decrease your risk of a blood clot.  It will also help keep you from getting deconditioned.  Every 1 to 2 hours get up and walk for 5 minutes. This will help with a quicker recovery back to normal.  Let pain be your guide so you don't do too much.  This is not the time for spring cleaning and don't plan on taking care of anyone else.  This time is for you to recover,  You will be more comfortable if you sleep and rest with your head elevated either with a few pillows under you or in a recliner.  No stomach sleeping for a three months.  WORK Everyone returns to work at different times.  As a rough guide, most people take at least 1 - 2 weeks off prior to returning to work. If you need documentation for your job, bring the forms to your postoperative follow up visit.  DRIVING Arrange for someone to bring you home from the hospital.  You may be able to drive a few days after surgery but not while taking any narcotics or valium.  BOWEL MOVEMENTS Constipation can occur after anesthesia and while taking pain medication.  It is important to stay ahead for your comfort.  We recommend taking Milk of Magnesia (2 tablespoons; twice a day) while taking the pain pills.  MEDICATIONS You may be prescribed should start after surgery At your preoperative visit for you  history and physical you may have been given the following medications: An antibiotic: Start this medication when you get home and take according to the instructions on the bottle. Zofran 4 mg:  This is to treat nausea and vomiting.  You can take this every 6 hours as needed and only if needed. Valium 2 mg: This is for muscle tightness if you have an implant or expander. This will help relax your muscle which also helps with pain control.  This can be taken every 12 hours as needed. Don't drive after taking this medication. Norco (hydrocodone/acetaminophen) 5/325 mg:  This is only to be used after you have taken the motrin or the tylenol. Every 8 hours as needed.   Over the counter Medication to take: Ibuprofen (Motrin) 600 mg:  Take this every 6 hours.  If you have additional pain then take 500 mg of the tylenol every 8 hours.  Only take the Norco after you have tried these two. Miralax or stool softener of choice: Take this according to the bottle if you take the [place] Call your surgeon's office if any of the following occur: Fever 101 degrees F or greater Excessive bleeding or fluid from the incision site. Pain that increases over time without aid from the medications Redness, warmth, or pus draining from incision sites Persistent nausea or inability to take in liquids Severe misshapen area that underwent the operation.  Here are some resources:  Plastic surgery website: https://www.plasticsurgery.org/for-medical-professionals/education-and-resources/publications/breast-reconstruction-magazine Breast Reconstruction Awareness Campaign:  [employer] Plastic surgery Implant information:  https://www.plasticsurgery.org/patient-safety/breast-implant-safety  About my Jackson-Pratt Bulb Drain  What is a Jackson-Pratt bulb? A Jackson-Pratt is a soft, round device used to collect drainage. It is connected to a long, thin drainage catheter, which is held in  place by one or two small stiches near your surgical incision site. When the bulb is squeezed, it forms a vacuum, forcing the drainage to empty into the bulb.  Emptying the Jackson-Pratt bulb- To empty the bulb: 1. Release the plug on the top of the bulb. 2. Pour the bulb's contents into a measuring container which your nurse will provide. 3. Record the time emptied and amount of drainage. Empty the drain(s) as often as your     doctor or nurse recommends.  Date                  Time                    Amount (Drain 1)                 Amount (Drain 2)  _____________________________________________________________________  _____________________________________________________________________  _____________________________________________________________________  _____________________________________________________________________  _____________________________________________________________________  _____________________________________________________________________  _____________________________________________________________________  _____________________________________________________________________  Squeezing the Jackson-Pratt Bulb- To squeeze the bulb:  1. Make sure the plug at the top of the bulb is open. 2. Squeeze the bulb tightly in your fist. You will hear air squeezing from the bulb. 3. Replace the plug while the bulb is squeezed. 4. Use a safety pin to attach the bulb to your clothing. This will keep the catheter from     pulling at the bulb insertion site.  When to call your doctor- Call your doctor if: Drain site becomes red, swollen or hot. You have a fever greater than 101 degrees F. There is oozing at the drain site. Drain falls out (apply a guaze bandage over the drain hole and secure it with tape). Drainage increases daily not related to activity patterns. (You will usually have more drainage when you are active than when you are resting.) Drainage has  a bad odor.

## 2021-09-30 NOTE — Anesthesia Preprocedure Evaluation (Signed)
Anesthesia Evaluation  Patient identified by MRN, date of birth, ID band Patient awake    Reviewed: Allergy & Precautions, NPO status , Patient's Chart, lab work & pertinent test results  Airway Mallampati: II  TM Distance: >3 FB Neck ROM: Full    Dental no notable dental hx.    Pulmonary neg pulmonary ROS,    Pulmonary exam normal breath sounds clear to auscultation       Cardiovascular negative cardio ROS Normal cardiovascular exam Rhythm:Regular Rate:Normal     Neuro/Psych negative neurological ROS  negative psych ROS   GI/Hepatic negative GI ROS, Neg liver ROS,   Endo/Other  negative endocrine ROS  Renal/GU negative Renal ROS  negative genitourinary   Musculoskeletal negative musculoskeletal ROS (+)   Abdominal   Peds negative pediatric ROS (+)  Hematology negative hematology ROS (+)   Anesthesia Other Findings Breast Cancer  Reproductive/Obstetrics negative OB ROS                             Anesthesia Physical Anesthesia Plan  ASA: 3  Anesthesia Plan: General   Post-op Pain Management: Regional block*, Dilaudid IV and Ofirmev IV (intra-op)*   Induction: Intravenous  PONV Risk Score and Plan: 3 and Ondansetron, Dexamethasone, Midazolam and Treatment may vary due to age or medical condition  Airway Management Planned: Oral ETT  Additional Equipment:   Intra-op Plan:   Post-operative Plan: Extubation in OR  Informed Consent: I have reviewed the patients History and Physical, chart, labs and discussed the procedure including the risks, benefits and alternatives for the proposed anesthesia with the patient or authorized representative who has indicated his/her understanding and acceptance.     Dental advisory given  Plan Discussed with: CRNA  Anesthesia Plan Comments:         Anesthesia Quick Evaluation

## 2021-10-01 ENCOUNTER — Other Ambulatory Visit: Payer: Self-pay

## 2021-10-01 ENCOUNTER — Encounter (HOSPITAL_BASED_OUTPATIENT_CLINIC_OR_DEPARTMENT_OTHER): Payer: Self-pay | Admitting: General Surgery

## 2021-10-01 NOTE — Progress Notes (Signed)
1 Day Post-Op   Subjective/Chief Complaint: No complaints   Objective: Vital signs in last 24 hours: Temp:  [97.4 F (36.3 C)-98.9 F (37.2 C)] 98.5 F (36.9 C) (08/08 0630) Pulse Rate:  [64-95] 78 (08/08 0700) Resp:  [9-19] 16 (08/08 0630) BP: (105-138)/(55-78) 123/67 (08/08 0630) SpO2:  [95 %-100 %] 99 % (08/08 0700) Weight:  [66.3 kg] 66.3 kg (08/07 1129)    Intake/Output from previous day: 08/07 0701 - 08/08 0700 In: 2070 [P.O.:670; I.V.:1400] Out: 200 [Drains:150; Blood:50] Intake/Output this shift: Total I/O In: 2641 [P.O.:420; I.V.:705] Out: -   General appearance: alert and cooperative Resp: clear to auscultation bilaterally Chest wall: skin flaps look good Cardio: regular rate and rhythm GI: soft, non-tender; bowel sounds normal; no masses,  no organomegaly  Lab Results:  No results for input(s): "WBC", "HGB", "HCT", "PLT" in the last 72 hours. BMET No results for input(s): "NA", "K", "CL", "CO2", "GLUCOSE", "BUN", "CREATININE", "CALCIUM" in the last 72 hours. PT/INR No results for input(s): "LABPROT", "INR" in the last 72 hours. ABG No results for input(s): "PHART", "HCO3" in the last 72 hours.  Invalid input(s): "PCO2", "PO2"  Studies/Results: No results found.  Anti-infectives: Anti-infectives (From admission, onward)    Start     Dose/Rate Route Frequency Ordered Stop   09/30/21 2100  ceFAZolin (ANCEF) IVPB 1 g/50 mL premix        1 g 100 mL/hr over 30 Minutes Intravenous Every 8 hours 09/30/21 1544 10/07/21 2159   09/30/21 1426  ceFAZolin 1 g / gentamicin 80 mg in NS 500 mL surgical irrigation  Status:  Discontinued          As needed 09/30/21 1426 09/30/21 1541   09/30/21 1115  ceFAZolin (ANCEF) IVPB 2g/100 mL premix        2 g 200 mL/hr over 30 Minutes Intravenous On call to O.R. 09/30/21 1113 09/30/21 1311   09/30/21 1115  ceFAZolin (ANCEF) IVPB 2g/100 mL premix  Status:  Discontinued        2 g 200 mL/hr over 30 Minutes Intravenous On  call to O.R. 09/30/21 1113 09/30/21 1702       Assessment/Plan: s/p Procedure(s): RIGHT MASTECTOMY WITH SENTINEL LYMPH NODE BIOPSY (Right) BREAST RECONSTRUCTION WITH PLACEMENT OF TISSUE EXPANDER AND FLEX HD (ACELLULAR HYDRATED DERMIS) (Right) Advance diet Discharge  LOS: 0 days    [name] 10/01/2021

## 2021-10-01 NOTE — Discharge Summary (Signed)
Physician Discharge Summary  Patient ID: Hannah Murray MRN: [id number] DOB/AGE: 1975-11-12 46 y.o.  Admit date: 09/30/2021 Discharge date: 10/01/2021  Admission Diagnoses: Upper outer quadrant right breast cancer  Discharge Diagnoses:  Principal Problem:   Breast cancer [facility])   Discharged Condition: good  Hospital Course: Patient is a 46 year old female with history of right-sided breast cancer.  Patient presented [facility] to [facility].  Patient underwent right mastectomy with deep right axillary sentinel lymph node biopsy with Dr. Marlou Starks followed by right immediate breast reconstruction with placement of acellular dermal matrix and tissue expanders with Dr. Marla Roe.  Patient was kept overnight for observation, pain control and to monitor drain output.  Patient is postop day 1.  Patient this morning is accompanied by her significant other at bedside.  Patient states she is doing well.  She denies any acute events overnight.  Patient reports her pain has been controlled.  She denies any fevers or chills.  Patient states that she has been drinking without issue.  Patient states she ate last night without any issue.  Patient denies any nausea or or vomiting.  She reports she has been voiding without issue.  She states that she has been passing gas.  Patient reports she has been ambulating.  She states that she feels good about going home today.  Consults: None  Significant Diagnostic Studies: N/A  Treatments: surgery: Right mastectomy with deep right axillary sentinel lymph node biopsy with Dr. Marlou Starks followed by right immediate breast reconstruction with placement of acellular dermal matrix and tissue expanders with Dr. Marla Roe on 09/30/2021  Discharge Exam: Blood pressure 123/67, pulse 78, temperature 98.5 F (36.9 C), resp. rate 16, height '5\' 5"'$  (1.651 m), weight 66.3 kg, last menstrual period 09/17/2021, SpO2 99 %. General appearance: alert, cooperative, and no  distress Resp: Unlabored breathing, no respiratory distress Chest wall: Right breast expanders in place.  There is no sign of hematoma or seroma.  There is no overlying erythema or ecchymosis.  Honeycomb dressing is in place and is clean dry and intact.  JP drain is in place and functioning.  There is approximately 5 cc of serous sanguinous drainage in the bulb. Lower extremities: Nonswollen, nontender  Disposition: Discharge disposition: 01-Home or Self Care     I encouraged the patient to drink plenty of water when she goes home and eat a high-protein low-carb diet.  I discussed with the patient to use caution with the Valium and the oxycodone as both of these medications can be sedating.  Patient expressed understanding.  All of the patient's questions were answered.  Discharge Instructions     (HEART FAILURE PATIENTS) Call MD:  Anytime you have any of the following symptoms: 1) 3 pound weight gain in 24 hours or 5 pounds in 1 week 2) shortness of breath, with or without a dry hacking cough 3) swelling in the hands, feet or stomach 4) if you have to sleep on extra pillows at night in order to breathe.   Complete by: As directed    Call MD for:  difficulty breathing, headache or visual disturbances   Complete by: As directed    Call MD for:  extreme fatigue   Complete by: As directed    Call MD for:  hives   Complete by: As directed    Call MD for:  persistant dizziness or light-headedness   Complete by: As directed    Call MD for:  persistant nausea and vomiting   Complete  by: As directed    Call MD for:  redness, tenderness, or signs of infection (pain, swelling, redness, odor or green/yellow discharge around incision site)   Complete by: As directed    Call MD for:  severe uncontrolled pain   Complete by: As directed    Call MD for:  temperature >100.4   Complete by: As directed    Diet - low sodium heart healthy   Complete by: As directed    Increase activity slowly    Complete by: As directed       Allergies as of 10/01/2021   No Known Allergies      Medication List     TAKE these medications    diazepam 2 MG tablet Commonly known as: Valium Take 1 tablet (2 mg total) by mouth every 12 (twelve) hours as needed for muscle spasms.   multivitamin capsule Take 1 capsule by mouth daily.   ondansetron 4 MG tablet Commonly known as: Zofran Take 1 tablet (4 mg total) by mouth every 8 (eight) hours as needed for nausea or vomiting.        Follow-up Information     Dillingham, Loel Lofty, DO Follow up in 10 day(s).   Specialty: Plastic Surgery Contact information: [address] [place] [address] [facility] [address] [place], [place] [address] [phone]  Signed: Clance Boll 10/01/2021, 7:43 AM

## 2021-10-02 LAB — SURGICAL PATHOLOGY

## 2021-10-07 ENCOUNTER — Encounter: Payer: Self-pay | Admitting: *Deleted

## 2021-10-07 ENCOUNTER — Telehealth: Payer: Self-pay | Admitting: *Deleted

## 2021-10-07 NOTE — Telephone Encounter (Signed)
Received order for oncotype testing. Requisition faxed to pathology and exact sciences 

## 2021-10-08 ENCOUNTER — Ambulatory Visit (INDEPENDENT_AMBULATORY_CARE_PROVIDER_SITE_OTHER): Payer: Self-pay | Admitting: Plastic Surgery

## 2021-10-08 ENCOUNTER — Encounter: Payer: Self-pay | Admitting: Plastic Surgery

## 2021-10-08 DIAGNOSIS — C50411 Malignant neoplasm of upper-outer quadrant of right female breast: Secondary | ICD-10-CM

## 2021-10-08 DIAGNOSIS — Z170 Estrogen receptor positive status [ER+]: Secondary | ICD-10-CM

## 2021-10-08 NOTE — Progress Notes (Signed)
   Subjective:    Patient ID: Hannah Murray, female    DOB: 25-Jul-1975, 46 y.o.   MRN: [id number]  The patient is a 46 year old female here for follow-up after undergoing a right mastectomy on 8/7 with expander placement.  She has 150 cc of saline placed in the expander at the time of the surgery. She states her pain is minimal.  No sign of hematoma or seroma.  Drain in place and output minimal.      Review of Systems  Constitutional:  Positive for activity change.  Eyes: Negative.   Respiratory: Negative.    Cardiovascular: Negative.   Gastrointestinal: Negative.        Objective:   Physical Exam Constitutional:      Appearance: Normal appearance.  Cardiovascular:     Rate and Rhythm: Normal rate.     Pulses: Normal pulses.  Pulmonary:     Effort: Pulmonary effort is normal.  Neurological:     Mental Status: She is alert and oriented to person, place, and time.  Psychiatric:        Mood and Affect: Mood normal.        Behavior: Behavior normal.        Assessment & Plan:     ICD-10-CM   1. Malignant neoplasm of upper-outer quadrant of right breast in female, estrogen receptor positive ([place])  C50.411    Z17.0     2. 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, right ([facility])  C50.411       We placed injectable saline in the Expander using a sterile technique: Right: 50 cc for a total of 200 / 535 cc  She can go into a regular sports bra, not too tight.

## 2021-10-14 ENCOUNTER — Inpatient Hospital Stay: Payer: Self-pay | Attending: Hematology and Oncology | Admitting: Hematology and Oncology

## 2021-10-14 ENCOUNTER — Encounter: Payer: Self-pay | Admitting: Hematology and Oncology

## 2021-10-14 ENCOUNTER — Other Ambulatory Visit: Payer: Self-pay

## 2021-10-14 ENCOUNTER — Encounter: Payer: Self-pay | Admitting: Physician Assistant

## 2021-10-14 ENCOUNTER — Ambulatory Visit (INDEPENDENT_AMBULATORY_CARE_PROVIDER_SITE_OTHER): Payer: Self-pay | Admitting: Physician Assistant

## 2021-10-14 DIAGNOSIS — E119 Type 2 diabetes mellitus without complications: Secondary | ICD-10-CM | POA: Insufficient documentation

## 2021-10-14 DIAGNOSIS — Z86718 Personal history of other venous thrombosis and embolism: Secondary | ICD-10-CM | POA: Insufficient documentation

## 2021-10-14 DIAGNOSIS — C50411 Malignant neoplasm of upper-outer quadrant of right female breast: Secondary | ICD-10-CM

## 2021-10-14 DIAGNOSIS — Z9011 Acquired absence of right breast and nipple: Secondary | ICD-10-CM | POA: Insufficient documentation

## 2021-10-14 DIAGNOSIS — C773 Secondary and unspecified malignant neoplasm of axilla and upper limb lymph nodes: Secondary | ICD-10-CM | POA: Insufficient documentation

## 2021-10-14 DIAGNOSIS — I10 Essential (primary) hypertension: Secondary | ICD-10-CM | POA: Insufficient documentation

## 2021-10-14 DIAGNOSIS — Z170 Estrogen receptor positive status [ER+]: Secondary | ICD-10-CM | POA: Insufficient documentation

## 2021-10-14 NOTE — Assessment & Plan Note (Signed)
This is a 46 year old female patient with newly diagnosed right breast IDC, ER/PR positive, HER2 negative, clinically stage T2 N0 M0 grade 2/3 referred to medical oncology for recommendations.  Since last visit she had mastectomy which showed grade 3 invasive ductal carcinoma measuring 4.3 cm in greatest dimension, margins negative along with solid type DCIS high-grade.  Right axillary lymph node evaluation showed 1 out of 8 lymph nodes positive for malignancy.  Oncotype test is pending at this time.   We have today discussed that she may need adjuvant chemotherapy given high risk features such as young age, high-grade, positive lymph node involvement.  She understands that the chemotherapy may last about 4 and half months long.  We did not well into the details of chemotherapy today.  Post chemotherapy she will proceed with adjuvant radiation followed by antiestrogen therapy with ovarian suppression.  I have briefly outlined the details of treatment and she was in agreement with all of it.  She will return to clinic in about 2 weeks to review all the above-mentioned recommendations.

## 2021-10-14 NOTE — Progress Notes (Signed)
This is a very pleasant 46 year old female seen in our office for follow-up evaluation status post right mastectomy on 09/30/2021 with expander placement by Dr. Marla Roe.  She was last seen in our office on 10/08/2021 by Dr. Marla Roe.  At that time she had been doing very well with minimal drain output and no significant concerns.  Initially had 150 cc of saline placed in the expander at the time of surgery, Dr. Marla Roe placed additional injectable saline in the expander at the last visit.  Right: 50 CC for a total of 200/535 cc  Since her last visit she denies any significant complaints or concerns, she denies any infectious symptoms, she notes very minimal pain and occasionally uses Tylenol.  She notes very minimal drain output noting approximately 10 cc daily for the last several days.  Additionally she does note a area of swelling to her right back along the medial scapular border, she denies any sensation of pain or irregularity at this site, but was told this by her sister-in-law who was in to bathe her.  Her husband who is at the office visit today denies seeing this previously but admits that this was not an area that he frequently visualized on her.  On exam the right breast is covered in honeycomb dressing, this was removed revealing Steri-Strips along the incision which were clean dry and intact, bilateral breast flaps viable with no necrosis, breast palpated with no obvious fluid collections, no warmth to touch, no redness.  JP drain site clean dry and intact, JP output serosanguineous and minimal.  Along the right medial sternal border there is a palpable mass below the skin, slightly mobile, nonpainful  The right JP drain was removed today without complication.  The patient will continue wearing compressive support bra, she will follow-up in 1 week on Monday with me at that time we will likely do another expander fill.  The patient will need to follow-up immediately if she develops any  new or worsening signs or symptoms.  She verbalized understanding and agreement to today's plan.  The patient's husband provided a majority of translation today also used [place] interpreters for additional translation.

## 2021-10-14 NOTE — Progress Notes (Signed)
[facility] NOTE  Patient Care Team: Pcp, No as PCP - General Rockwell [place], RN as Oncology Nurse Navigator Mauro Kaufmann, RN as Oncology Nurse Navigator [name], MD as Consulting Physician (Hematology and Oncology) Jovita Kussmaul, MD as Consulting Physician (General Surgery) Kyung Rudd, MD as Consulting Physician (Radiation Oncology)  CHIEF COMPLAINTS/PURPOSE OF CONSULTATION:  Newly diagnosed breast cancer  HISTORY OF PRESENTING ILLNESS:  Hannah Murray 46 y.o. female is here because of recent diagnosis of right breast cancer  I reviewed her records extensively and collaborated the history with the patient.  SUMMARY OF ONCOLOGIC HISTORY: Oncology History  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, right ([place])  05/06/2021 Mammogram   Highly suspicious ill defined palpable lump in the right breast at 10 0 clock position measuring at least 3.6 cms. Indeterminate mass in the right breast at 12:30, which may represent a fibroadenoma. No evidence of right axillary adenopathy.   05/16/2021 Pathology Results   Right breast 10 0 clock mass biopsy showed invasive mammary carcinoma, ductal phenotype prognostics include ER 95% strong staining intensity PR 95% strong staining intensity Ki-67 of 40% and HER2 negative   06/11/2021 Initial Diagnosis   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, right ([place])   06/11/2021 Cancer Staging   Staging form: Breast, AJCC 8th Edition - Clinical stage from 06/11/2021: Stage IIA (cT2, cN0, cM0, G3, ER+, PR+, HER2-) - Signed by [name], MD on 06/19/2021 Stage prefix: Initial diagnosis Method of lymph node assessment: Clinical Histologic grading system: 3 grade system   07/10/2021 Genetic Testing   Negative hereditary cancer genetic testing: no pathogenic variants detected [facility] Breast STAT Panel or Common Hereditary Cancers +RNA Panel.  Report dates are Jul 10, 2021 and Jul 18, 2021.   The STAT Breast cancer panel offered by [facility] includes sequencing and rearrangement analysis for the following 9 genes:  ATM, BRCA1, BRCA2, CDH1, CHEK2, PALB2, PTEN, STK11 and TP53.   The Common Hereditary Cancers + RNA Panel offered by [facility] includes sequencing, deletion/duplication, and RNA testing of the following 47 genes: APC, ATM, AXIN2, BARD1, BMPR1A, BRCA1, BRCA2, BRIP1, CDH1, CDK4*, CDKN2A (p14ARF)*, CDKN2A (p16INK4a)*, CHEK2, CTNNA1, DICER1, EPCAM (Deletion/duplication testing only), GREM1 (promoter region deletion/duplication testing only), KIT, MEN1, MLH1, MSH2, MSH3, MSH6, MUTYH, NBN, NF1, NHTL1, PALB2, PDGFRA*, PMS2, POLD1, POLE, PTEN, RAD50, RAD51C, RAD51D, SDHB, SDHC, SDHD, SMAD4, SMARCA4. STK11, TP53, TSC1, TSC2, and VHL.  The following genes were evaluated for sequence changes only: SDHA and HOXB13 c.251G>A variant only.  RNA analysis is not performed for the * genes.     09/30/2021 Pathology Results   Pathology from the right breast showed invasive ductal carcinoma grade 3 out of 3 4.3 cm in greatest dimension, margins negative grade 3 of 3 solid type DCIS as well.  Right axillary lymph node evaluation showed 1 out of 8 lymph nodes positive for malignancy., final pathologic staging T2N1A.    Breast cancer ([place])  09/30/2021 Initial Diagnosis   Breast cancer ([place])   09/30/2021 Pathology Results   Pathology from the right breast showed invasive ductal carcinoma grade 3 out of 3 4.3 cm in greatest dimension, margins negative grade 3 of 3 solid type DCIS as well.  Right axillary lymph node evaluation showed 1 out of 8 lymph nodes positive for malignancy., final pathologic staging T2N1A.     A certified Patent attorney used for the conversation.   Oncotype was sent on 8/14, pending. She is  doing well since surgery.  MEDICAL HISTORY:  Past medical history significant for diabetes mellitus, history of DVT, end-stage liver disease, heart valve disease, esophageal varices,  hypertension and dyslipidemia per Dr. Ethlyn Gallery note  SURGICAL HISTORY: Past Surgical History:  Procedure Laterality Date   BREAST RECONSTRUCTION WITH PLACEMENT OF TISSUE EXPANDER AND FLEX HD (ACELLULAR HYDRATED DERMIS) Right 09/30/2021   Procedure: BREAST RECONSTRUCTION WITH PLACEMENT OF TISSUE EXPANDER AND FLEX HD (ACELLULAR HYDRATED DERMIS);  Surgeon: Wallace Going, DO;  Location: [place];  Service: Plastics;  Laterality: Right;   MASTECTOMY W/ SENTINEL NODE BIOPSY Right 09/30/2021   Procedure: RIGHT MASTECTOMY WITH SENTINEL LYMPH NODE BIOPSY;  Surgeon: Jovita Kussmaul, MD;  Location: [place];  Service: General;  Laterality: Right;   NO PAST SURGERIES      SOCIAL HISTORY: Social History   Socioeconomic History   Marital status: Married    Spouse name: Not on file   Number of children: 1   Years of education: Not on file   Highest education level: High school graduate  Occupational History   Not on file  Tobacco Use   Smoking status: Never   Smokeless tobacco: Never  Vaping Use   Vaping Use: Never used  Substance and Sexual Activity   Alcohol use: Yes    Comment: occasionally   Drug use: Never   Sexual activity: Not Currently  Other Topics Concern   Not on file  Social History Narrative   Not on file   Social Determinants of Health   Financial Resource Strain: Not on file  Food Insecurity: No Food Insecurity (04/30/2021)   Hunger Vital Sign    Worried About Running Out of Food in the Last Year: Never true    Ran Out of Food in the Last Year: Never true  Transportation Needs: No Transportation Needs (04/30/2021)   PRAPARE - Hydrologist (Medical): No    Lack of Transportation (Non-Medical): No  Physical Activity: Not on file  Stress: Not on file  Social Connections: Not on file  Intimate Partner Violence: Not At Risk (07/02/2021)   Humiliation, Afraid, Rape, and Kick questionnaire    Fear of Current or  Ex-Partner: No    Emotionally Abused: No    Physically Abused: No    Sexually Abused: No    FAMILY HISTORY: Family History  Problem Relation Age of Onset   Hypertension Father    Diabetes Father    Thyroid disease Father    Ovarian cancer Other        MGM's niece; d. > 63    ALLERGIES:  has No Known Allergies.  MEDICATIONS:  Current Outpatient Medications  Medication Sig Dispense Refill   diazepam (VALIUM) 2 MG tablet Take 1 tablet (2 mg total) by mouth every 12 (twelve) hours as needed for muscle spasms. 20 tablet 0   Multiple Vitamin (MULTIVITAMIN) capsule Take 1 capsule by mouth daily.     No current facility-administered medications for this visit.    REVIEW OF SYSTEMS:   Constitutional: Denies fevers, chills or abnormal night sweats Eyes: Denies blurriness of vision, double vision or watery eyes Ears, nose, mouth, throat, and face: Denies mucositis or sore throat Respiratory: Denies cough, dyspnea or wheezes Cardiovascular: Denies palpitation, chest discomfort or lower extremity swelling Gastrointestinal:  Denies nausea, heartburn or change in bowel habits Skin: Denies abnormal skin rashes Lymphatics: Denies new lymphadenopathy or easy bruising Neurological:Denies numbness, tingling or new weaknesses Behavioral/Psych: Mood  is stable, no new changes  Breast: Denies any palpable lumps or discharge All other systems were reviewed with the patient and are negative.  PHYSICAL EXAMINATION: ECOG PERFORMANCE STATUS: 0 - Asymptomatic  Vitals:   10/14/21 1216  BP: 124/71  Pulse: 81  Resp: 18  Temp: 98.1 F (36.7 C)  SpO2: 100%   Filed Weights   10/14/21 1216  Weight: 147 lb (66.7 kg)    GENERAL:alert, no distress and comfortable Right mastectomy site appears healed to be healing well  LABORATORY DATA:  I have reviewed the data as listed No results found for: "WBC", "HGB", "HCT", "MCV", "PLT" No results found for: "NA", "K", "CL", "CO2"  RADIOGRAPHIC  STUDIES: I have personally reviewed the radiological reports and agreed with the findings in the report.  ASSESSMENT AND PLAN:  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, right [facility]) This is a 46 year old female patient with newly diagnosed right breast IDC, ER/PR positive, HER2 negative, clinically stage T2 N0 M0 grade 2/3 referred to medical oncology for recommendations.  Since last visit she had mastectomy which showed grade 3 invasive ductal carcinoma measuring 4.3 cm in greatest dimension, margins negative along with solid type DCIS high-grade.  Right axillary lymph node evaluation showed 1 out of 8 lymph nodes positive for malignancy.  Oncotype test is pending at this time.   We have today discussed that she may need adjuvant chemotherapy given high risk features such as young age, high-grade, positive lymph node involvement.  She understands that the chemotherapy may last about 4 and half months long.  We did not well into the details of chemotherapy today.  Post chemotherapy she will proceed with adjuvant radiation followed by antiestrogen therapy with ovarian suppression.  I have briefly outlined the details of treatment and she was in agreement with all of it.  She will return to clinic in about 2 weeks to review all the above-mentioned recommendations.   Total time spent: 30 minutes including history, review of records, counseling and coordination of care All questions were answered. The patient knows to call the clinic with any problems, questions or concerns.    [name], MD 10/14/21

## 2021-10-17 ENCOUNTER — Encounter (HOSPITAL_COMMUNITY): Payer: Self-pay

## 2021-10-21 ENCOUNTER — Encounter: Payer: Self-pay | Admitting: *Deleted

## 2021-10-21 ENCOUNTER — Telehealth: Payer: Self-pay | Admitting: *Deleted

## 2021-10-21 ENCOUNTER — Telehealth: Payer: Self-pay | Admitting: Hematology and Oncology

## 2021-10-21 ENCOUNTER — Ambulatory Visit (INDEPENDENT_AMBULATORY_CARE_PROVIDER_SITE_OTHER): Payer: Self-pay | Admitting: Physician Assistant

## 2021-10-21 DIAGNOSIS — C50411 Malignant neoplasm of upper-outer quadrant of right female breast: Secondary | ICD-10-CM

## 2021-10-21 NOTE — Progress Notes (Signed)
This is a very pleasant 46 year old female seen in our office for follow-up evaluation status post right mastectomy on 09/30/2021 with expander placement by Dr. Marla Roe.  She was last seen in our office on 10/14/2021 by myself.  At that time she had been doing very well.  She did have complaints of a mass on along her right scapula that was new.    1 week prior to that evaluation she had 50 cc of saline placed into her right-sided expander. Right: total of 200/535 cc  Since her last office visit she denies any complaints or concerns.  She denies any significant discomfort, no fever chills nausea or vomiting.  Chaperone present for exam.  On exam the right breast has no redness, discharge or swelling, no palpable fluid collections, flaps are viable with no necrosis.  Expander Fill today 10/21/2021  Right: 50 cc injectable saline for total of 250/535 cc  Overall the patient is doing very well with no significant complaints or concerns.  I would like to see the patient back in the office in 2 weeks for reevaluation and likely expander fill.  She will return immediately if she develops any new or worsening signs or symptoms.  The patient verbalized understanding and agreement to today's plan.  Translation was utilized via phone interpretation.

## 2021-10-21 NOTE — Telephone Encounter (Signed)
Received oncotype score of 24. Physician team notified.

## 2021-10-21 NOTE — Telephone Encounter (Signed)
Contacted patient to scheduled appointments. Left message with appointment details and a call back number if patient had any questions or could not accommodate the time we provided.   

## 2021-10-22 ENCOUNTER — Encounter: Payer: No Typology Code available for payment source | Admitting: Student

## 2021-10-23 ENCOUNTER — Inpatient Hospital Stay (HOSPITAL_BASED_OUTPATIENT_CLINIC_OR_DEPARTMENT_OTHER): Payer: Self-pay | Admitting: Hematology and Oncology

## 2021-10-23 ENCOUNTER — Encounter: Payer: Self-pay | Admitting: Hematology and Oncology

## 2021-10-23 VITALS — BP 125/67 | HR 93 | Temp 98.1°F | Resp 18 | Ht 65.0 in | Wt 147.1 lb

## 2021-10-23 DIAGNOSIS — C50411 Malignant neoplasm of upper-outer quadrant of right female breast: Secondary | ICD-10-CM

## 2021-10-23 MED ORDER — DEXAMETHASONE 4 MG PO TABS: ORAL_TABLET | ORAL | 1 refills | Status: DC

## 2021-10-23 MED ORDER — ONDANSETRON HCL 8 MG PO TABS: ORAL_TABLET | ORAL | 1 refills | Status: DC

## 2021-10-23 MED ORDER — LIDOCAINE-PRILOCAINE 2.5-2.5 % EX CREA: TOPICAL_CREAM | CUTANEOUS | 3 refills | Status: DC

## 2021-10-23 MED ORDER — PROCHLORPERAZINE MALEATE 10 MG PO TABS: 10.0000 mg | ORAL_TABLET | Freq: Four times a day (QID) | ORAL | 1 refills | Status: DC | PRN

## 2021-10-23 NOTE — Progress Notes (Signed)
[place] NOTE  Patient Care Team: Pcp, No as PCP - General Rockwell Germany, RN as Oncology Nurse Navigator Mauro Kaufmann, RN as Oncology Nurse Navigator [name], MD as Consulting Physician (Hematology and Oncology) Jovita Kussmaul, MD as Consulting Physician (General Surgery) Kyung Rudd, MD as Consulting Physician (Radiation Oncology)  CHIEF COMPLAINTS/PURPOSE OF CONSULTATION:  Newly diagnosed breast cancer  HISTORY OF PRESENTING ILLNESS:  Hannah Murray 46 y.o. female is here because of recent diagnosis of right breast cancer  I reviewed her records extensively and collaborated the history with the patient.  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 Indeterminate mass in the right breast at 12:30, which may represent a fibroadenoma. No evidence of right axillary adenopathy.   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  Report dates are Jul 10, 2021 and Jul 18, 2021.   The STAT Breast cancer panel offered by [facility] includes sequencing and rearrangement analysis for the following 9 genes:  ATM, BRCA1, BRCA2, CDH1, CHEK2, PALB2, PTEN, STK11 and TP53.   The Common Hereditary Cancers + RNA Panel offered by [facility] includes sequencing, deletion/duplication, and RNA testing of the following 47 genes: APC, ATM, AXIN2, BARD1, BMPR1A, BRCA1, BRCA2, BRIP1, CDH1, CDK4*, CDKN2A (p14ARF)*, CDKN2A (p16INK4a)*, CHEK2, CTNNA1, DICER1, EPCAM (Deletion/duplication testing only), GREM1 (promoter region deletion/duplication testing only), KIT, MEN1, MLH1, MSH2, MSH3, MSH6, MUTYH, NBN, NF1, NHTL1, PALB2, PDGFRA*, PMS2, POLD1, POLE, PTEN, RAD50, RAD51C, RAD51D, SDHB, SDHC, SDHD, SMAD4, SMARCA4. STK11, TP53, TSC1, TSC2, and VHL.  The following genes were evaluated for sequence changes only: SDHA and HOXB13 c.251G>A variant only.  RNA analysis is not performed for the * genes.     09/30/2021 Pathology Results   Pathology from the right breast showed invasive ductal carcinoma grade 3 out of 3 4.3 cm in greatest dimension, margins negative grade 3 of 3 solid type DCIS as well.  Right axillary lymph node evaluation showed 1 out of 8 lymph nodes positive for malignancy., final pathologic staging T2N1A.    11/06/2021 -  Chemotherapy   Patient is on Treatment Plan : BREAST ADJUVANT DOSE DENSE AC q14d / PACLitaxel q7d     Breast cancer ([place])  09/30/2021 Initial Diagnosis   Breast cancer ([place])   09/30/2021 Pathology Results   Pathology from the right breast showed invasive ductal carcinoma grade 3 out of 3 4.3 cm in greatest dimension, margins negative grade 3 of 3 solid type DCIS as well.  Right axillary lymph node evaluation showed 1 out of 8 lymph nodes positive for malignancy., final  pathologic staging T2N1A.     A certified Patent attorney used for the conversation.   She is here to review oncotype results.  MEDICAL HISTORY:  Past medical history significant for diabetes  mellitus, history of DVT, end-stage liver disease, heart valve disease, esophageal varices, hypertension and dyslipidemia per Dr. Ethlyn Gallery note  SURGICAL HISTORY: Past Surgical History:  Procedure Laterality Date   BREAST RECONSTRUCTION WITH PLACEMENT OF TISSUE EXPANDER AND FLEX HD (ACELLULAR HYDRATED DERMIS) Right 09/30/2021   Procedure: BREAST RECONSTRUCTION WITH PLACEMENT OF TISSUE EXPANDER AND FLEX HD (ACELLULAR HYDRATED DERMIS);  Surgeon: Wallace Going, DO;  Location: [place];  Service: Plastics;  Laterality: Right;   MASTECTOMY W/ SENTINEL NODE BIOPSY Right 09/30/2021   Procedure: RIGHT MASTECTOMY WITH SENTINEL LYMPH NODE BIOPSY;  Surgeon: Jovita Kussmaul, MD;  Location: [place];  Service: General;  Laterality: Right;   NO PAST SURGERIES      SOCIAL HISTORY: Social History   Socioeconomic History   Marital status: Married    Spouse name: Not on file   Number of children: 1   Years of education: Not on file   Highest education level: High school graduate  Occupational History   Not on file  Tobacco Use   Smoking status: Never   Smokeless tobacco: Never  Vaping Use   Vaping Use: Never used  Substance and Sexual Activity   Alcohol use: Yes    Comment: occasionally   Drug use: Never   Sexual activity: Not Currently  Other Topics Concern   Not on file  Social History Narrative   Not on file   Social Determinants of Health   Financial Resource Strain: Not on file  Food Insecurity: No Food Insecurity (04/30/2021)   Hunger Vital Sign    Worried About Running Out of Food in the Last Year: Never true    Ran Out of Food in the Last Year: Never true  Transportation Needs: No Transportation Needs (04/30/2021)   PRAPARE - Hydrologist (Medical): No    Lack of Transportation (Non-Medical): No  Physical Activity: Not on file  Stress: Not on file  Social Connections: Not on file  Intimate Partner Violence: Not At  Risk (07/02/2021)   Humiliation, Afraid, Rape, and Kick questionnaire    Fear of Current or Ex-Partner: No    Emotionally Abused: No    Physically Abused: No    Sexually Abused: No    FAMILY HISTORY: Family History  Problem Relation Age of Onset   Hypertension Father    Diabetes Father    Thyroid disease Father    Ovarian cancer Other        MGM's niece; d. > 33    ALLERGIES:  has No Known Allergies.  MEDICATIONS:  Current Outpatient Medications  Medication Sig Dispense Refill   dexamethasone (DECADRON) 4 MG tablet Take 2 tablets (8 mg total) by mouth daily for 3 days. Start the day after doxorubicin/cyclophosphamide chemotherapy. Take with food. 30 tablet 1   diazepam (VALIUM) 2 MG tablet Take 1 tablet (2 mg total) by mouth every 12 (twelve) hours as needed for muscle spasms. 20 tablet 0   lidocaine-prilocaine (EMLA) cream Apply to affected area once 30 g 3   Multiple Vitamin (MULTIVITAMIN) capsule Take 1 capsule by mouth daily.     ondansetron (ZOFRAN) 8 MG tablet Take 1 tab (8 mg) by mouth every 8 hrs as needed for nausea/vomiting. Start third day after doxorubicin/cyclophosphamide chemotherapy.  30 tablet 1   prochlorperazine (COMPAZINE) 10 MG tablet Take 1 tablet (10 mg total) by mouth every 6 (six) hours as needed for nausea or vomiting. 30 tablet 1   No current facility-administered medications for this visit.    REVIEW OF SYSTEMS:   Constitutional: Denies fevers, chills or abnormal night sweats Eyes: Denies blurriness of vision, double vision or watery eyes Ears, nose, mouth, throat, and face: Denies mucositis or sore throat Respiratory: Denies cough, dyspnea or wheezes Cardiovascular: Denies palpitation, chest discomfort or lower extremity swelling Gastrointestinal:  Denies nausea, heartburn or change in bowel habits Skin: Denies abnormal skin rashes Lymphatics: Denies new lymphadenopathy or easy bruising Neurological:Denies numbness, tingling or new  weaknesses Behavioral/Psych: Mood is stable, no new changes  Breast: Denies any palpable lumps or discharge All other systems were reviewed with the patient and are negative.  PHYSICAL EXAMINATION: ECOG PERFORMANCE STATUS: 0 - Asymptomatic  Vitals:   10/23/21 1339  BP: 125/67  Pulse: 93  Resp: 18  Temp: 98.1 F (36.7 C)  SpO2: 98%   Filed Weights   10/23/21 1339  Weight: 147 lb 1.6 oz (66.7 kg)    GENERAL:alert, no distress and comfortable Right mastectomy site appears healed to be healing well  LABORATORY DATA:  I have reviewed the data as listed No results found for: "WBC", "HGB", "HCT", "MCV", "PLT" No results found for: "NA", "K", "CL", "CO2"  RADIOGRAPHIC STUDIES: I have personally reviewed the radiological reports and agreed with the findings in the report.  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  Since last visit she had mastectomy which showed grade 3 invasive ductal carcinoma measuring 4.3 cm in greatest dimension, margins negative along with solid type DCIS high-grade.  Right axillary lymph node evaluation showed 1 out of 8 lymph nodes positive for malignancy.  Oncotype test resulted at 24, benefit of chemotherapy cannot be excluded.  We have discussed to consider chemotherapy given these results and given young age, large tumor and lymph node positivity. Given Positive LN, we discussed about dd AC followed by T. We have today discussed scheduled for chemotherapy which will be every 2 weeks for 4 cycles followed by every week for 12 cycles.  We have discussed adverse effects of chemotherapy including but not limited to fatigue, nausea, vomiting, diarrhea, increased risk of infections, cytopenias, cardiotoxicity,  neuropathy.  She understands that some of the side effects can be life-threatening and permanent.  We have discussed about the cardiotoxicity with Adriamycin and the need to monitor her heart.  She wants to try cold cap to minimize the risk of alopecia from chemotherapy.  She understands that this may not be covered by her health insurance.  She will need a port placed.  I have sent an in basket message to Dr. Marlou Starks to see if port can be placed for chemotherapy administration. Baseline echo ordered. Chemotherapy plan placed.  Anticipate starting chemo in the next 2 weeks.  Chemo education to be scheduled.  She will return to clinic before cycle 1 day 1 of chemotherapy.  Thank you for consulting [place] in the care of this patient.  Please do not hesitate contact us with any additional questions or concerns.  Total time spent: 30 minutes including history, review of records, counseling and coordination of care  All questions were answered. The patient knows to call the clinic with any problems, questions or concerns.    [name], MD 10/23/21

## 2021-10-23 NOTE — Progress Notes (Signed)
START ON PATHWAY REGIMEN - Breast     Cycles 1 through 4: A cycle is every 14 days:     Doxorubicin      Cyclophosphamide      [id number]    Cycles 5 through 16: A cycle is every 7 days:     Paclitaxel   **Always confirm dose/schedule in your pharmacy ordering system**  Patient Characteristics: Postoperative without Neoadjuvant Therapy (Pathologic Staging), Invasive Disease, Adjuvant Therapy, HER2 Negative/Unknown/Equivocal, ER Positive, Node Positive, Node Positive (1-3), Oncotyping Ordered, Oncotype Low/Intermediate Risk (? 25),  Premenopausal, Chemotherapy Candidate Therapeutic Status: Postoperative without Neoadjuvant Therapy (Pathologic Staging) AJCC Grade: G3 AJCC N Category: pN1 AJCC M Category: cM0 ER Status: Positive (+) AJCC 8 Stage Grouping: IIA HER2 Status: Negative (-) Oncotype Dx Recurrence Score: 24 AJCC T Category: pT2 PR Status: Positive (+) Adjuvant Therapy Status: No Adjuvant Therapy Received Yet or Changing Initial Adjuvant Regimen due to Tolerance Has this patient completed genomic testing<= Yes - Oncotype DX(R) Menopausal Status: Premenopausal Intent of Therapy: Curative Intent, Discussed with Patient

## 2021-10-23 NOTE — Assessment & Plan Note (Signed)
This is a 46 year old female patient with newly diagnosed right breast IDC, ER/PR positive, HER2 negative, clinically stage T2 N0 M0 grade 2/3 referred to medical oncology for recommendations.  Since last visit she had mastectomy which showed grade 3 invasive ductal carcinoma measuring 4.3 cm in greatest dimension, margins negative along with solid type DCIS high-grade.  Right axillary lymph node evaluation showed 1 out of 8 lymph nodes positive for malignancy.  Oncotype test resulted at 24, benefit of chemotherapy cannot be excluded.  We have discussed to consider chemotherapy given these results and given young age, large tumor and lymph node positivity. Given Positive LN, we discussed about dd AC followed by T. We have today discussed scheduled for chemotherapy which will be every 2 weeks for 4 cycles followed by every week for 12 cycles.  We have discussed adverse effects of chemotherapy including but not limited to fatigue, nausea, vomiting, diarrhea, increased risk of infections, cytopenias, cardiotoxicity, neuropathy.  She understands that some of the side effects can be life-threatening and permanent.  We have discussed about the cardiotoxicity with Adriamycin and the need to monitor her heart.  She wants to try cold cap to minimize the risk of alopecia from chemotherapy.  She understands that this may not be covered by her health insurance.  She will need a port placed.  I have sent an in basket message to Dr. Marlou Starks to see if port can be placed for chemotherapy administration. Baseline echo ordered. Chemotherapy plan placed.  Anticipate starting chemo in the next 2 weeks.  Chemo education to be scheduled.  She will return to clinic before cycle 1 day 1 of chemotherapy.  Thank you for consulting [place] in the care of this patient.  Please do not hesitate contact us with any additional questions or concerns.

## 2021-10-24 ENCOUNTER — Other Ambulatory Visit: Payer: Self-pay

## 2021-10-25 ENCOUNTER — Encounter: Payer: Self-pay | Admitting: *Deleted

## 2021-10-25 ENCOUNTER — Other Ambulatory Visit: Payer: Self-pay

## 2021-10-29 ENCOUNTER — Telehealth: Payer: Self-pay | Admitting: Hematology and Oncology

## 2021-10-29 ENCOUNTER — Other Ambulatory Visit: Payer: Self-pay

## 2021-10-29 NOTE — Telephone Encounter (Signed)
Contacted patient to scheduled appointments. Patient is aware of appointments that are scheduled.   

## 2021-10-29 NOTE — Progress Notes (Signed)
Pharmacist Chemotherapy Monitoring - Initial Assessment    Anticipated start date: 11/05/21   The following has been reviewed per standard work regarding the patient's treatment regimen: The patient's diagnosis, treatment plan and drug doses, and organ/hematologic function Lab orders and baseline tests specific to treatment regimen  The treatment plan start date, drug sequencing, and pre-medications Prior authorization status  Patient's documented medication list, including drug-drug interaction screen and prescriptions for anti-emetics and supportive care specific to the treatment regimen The drug concentrations, fluid compatibility, administration routes, and timing of the medications to be used The patient's access for treatment and lifetime cumulative dose history, if applicable  The patient's medication allergies and previous infusion related reactions, if applicable   Changes made to treatment plan:  The pharmacy team has substituted IV diphenhydramine for IV cetirizine as a premedication for carboplatin/paclitaxel. Patient will be monitored for hypersensitivity reaction and adverse reactions to IV cetirizine. Thanks.    Follow up needed:  Self-pay per prior auth note.   F/u echo results   Philomena Course, [place], 10/29/2021  2:44 PM

## 2021-10-30 ENCOUNTER — Other Ambulatory Visit: Payer: Self-pay

## 2021-11-04 ENCOUNTER — Inpatient Hospital Stay: Payer: No Typology Code available for payment source

## 2021-11-04 ENCOUNTER — Other Ambulatory Visit: Payer: Self-pay | Admitting: *Deleted

## 2021-11-04 ENCOUNTER — Inpatient Hospital Stay: Payer: Self-pay | Attending: Hematology and Oncology | Admitting: Hematology and Oncology

## 2021-11-04 ENCOUNTER — Encounter: Payer: Self-pay | Admitting: Hematology and Oncology

## 2021-11-04 ENCOUNTER — Other Ambulatory Visit: Payer: Self-pay

## 2021-11-04 ENCOUNTER — Ambulatory Visit (INDEPENDENT_AMBULATORY_CARE_PROVIDER_SITE_OTHER): Payer: Self-pay | Admitting: Physician Assistant

## 2021-11-04 ENCOUNTER — Ambulatory Visit (HOSPITAL_COMMUNITY)
Admission: RE | Admit: 2021-11-04 | Discharge: 2021-11-04 | Disposition: A | Discharge status: Home / Self Care | Payer: Self-pay | Source: Ambulatory Visit | Attending: Hematology and Oncology | Admitting: Hematology and Oncology

## 2021-11-04 DIAGNOSIS — C50411 Malignant neoplasm of upper-outer quadrant of right female breast: Secondary | ICD-10-CM

## 2021-11-04 DIAGNOSIS — Z5189 Encounter for other specified aftercare: Secondary | ICD-10-CM | POA: Insufficient documentation

## 2021-11-04 DIAGNOSIS — Z853 Personal history of malignant neoplasm of breast: Secondary | ICD-10-CM | POA: Insufficient documentation

## 2021-11-04 DIAGNOSIS — Z170 Estrogen receptor positive status [ER+]: Secondary | ICD-10-CM | POA: Insufficient documentation

## 2021-11-04 DIAGNOSIS — Z0189 Encounter for other specified special examinations: Secondary | ICD-10-CM

## 2021-11-04 DIAGNOSIS — Z5111 Encounter for antineoplastic chemotherapy: Secondary | ICD-10-CM | POA: Insufficient documentation

## 2021-11-04 DIAGNOSIS — Z9011 Acquired absence of right breast and nipple: Secondary | ICD-10-CM | POA: Insufficient documentation

## 2021-11-04 LAB — CMP (CANCER CENTER ONLY)
ALT: 11 U/L (ref 0–44)
AST: 14 U/L — ABNORMAL LOW (ref 15–41)
Albumin: 4.3 g/dL (ref 3.5–5.0)
Alkaline Phosphatase: 52 U/L (ref 38–126)
Anion gap: 3 — ABNORMAL LOW (ref 5–15)
BUN: 12 mg/dL (ref 6–20)
CO2: 28 mmol/L (ref 22–32)
Calcium: 9.1 mg/dL (ref 8.9–10.3)
Chloride: 104 mmol/L (ref 98–111)
Creatinine: 0.72 mg/dL (ref 0.44–1.00)
GFR, Estimated: >60 mL/min (ref >60)
Glucose, Bld: 113 mg/dL — ABNORMAL HIGH (ref 70–99)
Potassium: 4 mmol/L (ref 3.5–5.1)
Sodium: 135 mmol/L (ref 135–145)
Total Bilirubin: 0.4 mg/dL (ref 0.3–1.2)
Total Protein: 7.3 g/dL (ref 6.5–8.1)

## 2021-11-04 LAB — CBC WITH DIFFERENTIAL (CANCER CENTER ONLY)
Abs Immature Granulocytes: 0.01 10*3/uL (ref 0.00–0.07)
Basophils Absolute: 0 10*3/uL (ref 0.0–0.1)
Basophils Relative: 0 %
Eosinophils Absolute: 0.2 10*3/uL (ref 0.0–0.5)
Eosinophils Relative: 3 %
HCT: 40.3 % (ref 36.0–46.0)
Hemoglobin: 13.3 g/dL (ref 12.0–15.0)
Immature Granulocytes: 0 %
Lymphocytes Relative: 25 %
Lymphs Abs: 1.6 10*3/uL (ref 0.7–4.0)
MCH: 28.7 pg (ref 26.0–34.0)
MCHC: 33 g/dL (ref 30.0–36.0)
MCV: 87 fL (ref 80.0–100.0)
Monocytes Absolute: 0.4 10*3/uL (ref 0.1–1.0)
Monocytes Relative: 7 %
Neutro Abs: 4 10*3/uL (ref 1.7–7.7)
Neutrophils Relative %: 65 %
Platelet Count: 220 10*3/uL (ref 150–400)
RBC: 4.63 MIL/uL (ref 3.87–5.11)
RDW: 11.7 % (ref 11.5–15.5)
WBC Count: 6.2 10*3/uL (ref 4.0–10.5)
nRBC: 0 % (ref 0.0–0.2)

## 2021-11-04 LAB — ECHOCARDIOGRAM COMPLETE
Area-P 1/2: 4.21 cm2
Calc EF: 56.9 %
S' Lateral: 2.8 cm
Single Plane A2C EF: 58.6 %
Single Plane A4C EF: 56 %

## 2021-11-04 MED ORDER — PROCHLORPERAZINE MALEATE 10 MG PO TABS: 10.0000 mg | ORAL_TABLET | Freq: Four times a day (QID) | ORAL | 1 refills | Status: DC | PRN

## 2021-11-04 MED ORDER — DEXAMETHASONE 4 MG PO TABS: ORAL_TABLET | ORAL | 1 refills | Status: DC

## 2021-11-04 MED ORDER — LIDOCAINE-PRILOCAINE 2.5-2.5 % EX CREA: TOPICAL_CREAM | CUTANEOUS | 3 refills | Status: DC

## 2021-11-04 MED FILL — Dexamethasone Sodium Phosphate Inj 100 MG/10ML: INTRAMUSCULAR | Qty: 1 | Status: AC

## 2021-11-04 MED FILL — Fosaprepitant Dimeglumine For IV Infusion 150 MG (Base Eq): INTRAVENOUS | Qty: 5 | Status: AC

## 2021-11-04 NOTE — Progress Notes (Signed)
[place] NOTE  Patient Care Team: Pcp, No as PCP - General Rockwell Germany, RN as Oncology Nurse Navigator Mauro Kaufmann, RN as Oncology Nurse Navigator [name], MD as Consulting Physician (Hematology and Oncology) Jovita Kussmaul, MD as Consulting Physician (General Surgery) Kyung Rudd, MD as Consulting Physician (Radiation Oncology)  CHIEF COMPLAINTS/PURPOSE OF CONSULTATION:  Newly diagnosed breast cancer  HISTORY OF PRESENTING ILLNESS:  Hannah Murray 46 y.o. female is here because of recent diagnosis of right breast cancer  I reviewed her records extensively and collaborated the history with the patient.  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 Indeterminate mass in the right breast at 12:30, which may represent a fibroadenoma. No evidence of right axillary adenopathy.   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  Report dates are Jul 10, 2021 and Jul 18, 2021.   The STAT Breast cancer panel offered by [facility] includes sequencing and rearrangement analysis for the following 9 genes:  ATM, BRCA1, BRCA2, CDH1, CHEK2, PALB2, PTEN, STK11 and TP53.   The Common Hereditary Cancers + RNA Panel offered by [facility] includes sequencing, deletion/duplication, and RNA testing of the following 47 genes: APC, ATM, AXIN2, BARD1, BMPR1A, BRCA1, BRCA2, BRIP1, CDH1, CDK4*, CDKN2A (p14ARF)*, CDKN2A (p16INK4a)*, CHEK2, CTNNA1, DICER1, EPCAM (Deletion/duplication testing only), GREM1 (promoter region deletion/duplication testing only), KIT, MEN1, MLH1, MSH2, MSH3, MSH6, MUTYH, NBN, NF1, NHTL1, PALB2, PDGFRA*, PMS2, POLD1, POLE, PTEN, RAD50, RAD51C, RAD51D, SDHB, SDHC, SDHD, SMAD4, SMARCA4. STK11, TP53, TSC1, TSC2, and VHL.  The following genes were evaluated for sequence changes only: SDHA and HOXB13 c.251G>A variant only.  RNA analysis is not performed for the * genes.     09/30/2021 Pathology Results   Pathology from the right breast showed invasive ductal carcinoma grade 3 out of 3 4.3 cm in greatest dimension, margins negative grade 3 of 3 solid type DCIS as well.  Right axillary lymph node evaluation showed 1 out of 8 lymph nodes positive for malignancy., final pathologic staging T2N1A.    11/06/2021 -  Chemotherapy   Patient is on Treatment Plan : BREAST ADJUVANT DOSE DENSE AC q14d / PACLitaxel q7d     Breast cancer ([place])  09/30/2021 Initial Diagnosis   Breast cancer ([place])   09/30/2021 Pathology Results   Pathology from the right breast showed invasive ductal carcinoma grade 3 out of 3 4.3 cm in greatest dimension, margins negative grade 3 of 3 solid type DCIS as well.  Right axillary lymph node evaluation showed 1 out of 8 lymph nodes positive for malignancy., final  pathologic staging T2N1A.     A certified Patent attorney used for the conversation.     MEDICAL HISTORY:  Past medical history significant for diabetes mellitus, history of DVT, end-stage liver  disease, heart valve disease, esophageal varices, hypertension and dyslipidemia per Dr. Ethlyn Gallery note  SURGICAL HISTORY: Past Surgical History:  Procedure Laterality Date   BREAST RECONSTRUCTION WITH PLACEMENT OF TISSUE EXPANDER AND FLEX HD (ACELLULAR HYDRATED DERMIS) Right 09/30/2021   Procedure: BREAST RECONSTRUCTION WITH PLACEMENT OF TISSUE EXPANDER AND FLEX HD (ACELLULAR HYDRATED DERMIS);  Surgeon: Wallace Going, DO;  Location: [place];  Service: Plastics;  Laterality: Right;   MASTECTOMY W/ SENTINEL NODE BIOPSY Right 09/30/2021   Procedure: RIGHT MASTECTOMY WITH SENTINEL LYMPH NODE BIOPSY;  Surgeon: Jovita Kussmaul, MD;  Location: [place];  Service: General;  Laterality: Right;   NO PAST SURGERIES      SOCIAL HISTORY: Social History   Socioeconomic History   Marital status: Married    Spouse name: Not on file   Number of children: 1   Years of education: Not on file   Highest education level: High school graduate  Occupational History   Not on file  Tobacco Use   Smoking status: Never   Smokeless tobacco: Never  Vaping Use   Vaping Use: Never used  Substance and Sexual Activity   Alcohol use: Yes    Comment: occasionally   Drug use: Never   Sexual activity: Not Currently  Other Topics Concern   Not on file  Social History Narrative   Not on file   Social Determinants of Health   Financial Resource Strain: Not on file  Food Insecurity: No Food Insecurity (04/30/2021)   Hunger Vital Sign    Worried About Running Out of Food in the Last Year: Never true    Ran Out of Food in the Last Year: Never true  Transportation Needs: No Transportation Needs (04/30/2021)   PRAPARE - Hydrologist (Medical): No    Lack of Transportation (Non-Medical): No  Physical Activity: Not on file  Stress: Not on file  Social Connections: Not on file  Intimate Partner Violence: Not At Risk (07/02/2021)   Humiliation, Afraid, Rape,  and Kick questionnaire    Fear of Current or Ex-Partner: No    Emotionally Abused: No    Physically Abused: No    Sexually Abused: No    FAMILY HISTORY: Family History  Problem Relation Age of Onset   Hypertension Father    Diabetes Father    Thyroid disease Father    Ovarian cancer Other        MGM's niece; d. > 48    ALLERGIES:  has No Known Allergies.  MEDICATIONS:  Current Outpatient Medications  Medication Sig Dispense Refill   dexamethasone (DECADRON) 4 MG tablet Take 2 tablets (8 mg total) by mouth daily for 3 days. Start the day after doxorubicin/cyclophosphamide chemotherapy. Take with food. 30 tablet 1   lidocaine-prilocaine (EMLA) cream Apply to affected area once 30 g 3   ondansetron (ZOFRAN) 8 MG tablet Take 1 tab (8 mg) by mouth every 8 hrs as needed for nausea/vomiting. Start third day after doxorubicin/cyclophosphamide chemotherapy. 30 tablet 1   prochlorperazine (COMPAZINE) 10 MG tablet Take 1 tablet (10 mg total) by mouth every 6 (six) hours as needed for nausea or vomiting. 30 tablet 1   No current facility-administered medications for this visit.    REVIEW OF SYSTEMS:  Constitutional: Denies fevers, chills or abnormal night sweats Eyes: Denies blurriness of vision, double vision or watery eyes Ears, nose, mouth, throat, and face: Denies mucositis or sore throat Respiratory: Denies cough, dyspnea or wheezes Cardiovascular: Denies palpitation, chest discomfort or lower extremity swelling Gastrointestinal:  Denies nausea, heartburn or change in bowel habits Skin: Denies abnormal skin rashes Lymphatics: Denies new lymphadenopathy or easy bruising Neurological:Denies numbness, tingling or new weaknesses Behavioral/Psych: Mood is stable, no new changes  Breast: Denies any palpable lumps or discharge All other systems were reviewed with the patient and are negative.  PHYSICAL EXAMINATION: ECOG PERFORMANCE STATUS: 0 - Asymptomatic  Vitals:   11/04/21 1149   BP: 128/71  Pulse: 90  Resp: 18  Temp: 98.1 F (36.7 C)  SpO2: 100%   Filed Weights   11/04/21 1149  Weight: 147 lb 6.4 oz (66.9 kg)    GENERAL:alert, no distress and comfortable Right mastectomy site appears healed to be healing well  LABORATORY DATA:  I have reviewed the data as listed No results found for: "WBC", "HGB", "HCT", "MCV", "PLT" No results found for: "NA", "K", "CL", "CO2"  RADIOGRAPHIC STUDIES: I have personally reviewed the radiological reports and agreed with the findings in the report.  ASSESSMENT AND PLAN:  No problem-specific Assessment & Plan notes found for this encounter.  Total time spent: 30 minutes including history, review of records, counseling and coordination of care All questions were answered. The patient knows to call the clinic with any problems, questions or concerns.    [name], MD 11/04/21

## 2021-11-04 NOTE — Progress Notes (Signed)
  Echocardiogram 2D Echocardiogram has been performed.  Hannah Murray 11/04/2021, 9:01 AM

## 2021-11-04 NOTE — Progress Notes (Signed)
Interpreter present for entire evaluation  This is a very pleasant 46 year old female seen in our office for follow-up evaluation status post right mastectomy on 09/30/2021 with expander placement by Dr. Marla Roe.  She was last seen in the office on 10/21/2021 and had a fill at that time.  Since her last office visit she has been doing very well with no significant complaints or concerns.  She denies any pain, she denies any infectious signs or symptoms.  She notes that she continue to follow-up with oncology and will be starting chemotherapy soon.  No plan for radiation at this time.  She believes that her duration of chemotherapy will be approximately 4 months.  Chaperone present.  On exam the right breast has expander in place, no overlying redness or discharge, no warmth to touch, no swelling or palpable areas of fluctuance or firmness.  Flaps are viable with no necrosis, incision clean dry and intact with 1 minimal superficial wound in the middle of the incision.   Injectable saline was placed into bilateral tissue expanders    Right: 50 cc for a total of 300/535 cc  The patient tolerated this well without any issues  Overall the patient is doing very well.  She has no signs of infectious etiology.  She tolerated the fill without difficulty.  Would like to see her back in 2 weeks for repeat evaluation and another fill.  She will apply Vaseline to her incision and reach out immediately she develops any new or worsening signs or symptoms.  She verbalized understanding and agreement to today's plan had no further questions or concerns.

## 2021-11-04 NOTE — Assessment & Plan Note (Signed)
This is a 46 year old female patient with newly diagnosed right breast IDC, ER/PR positive, HER2 negative, clinically stage T2 N0 M0 grade 2/3 referred to medical oncology for recommendations.  Since last visit she had mastectomy which showed grade 3 invasive ductal carcinoma measuring 4.3 cm in greatest dimension, margins negative along with solid type DCIS high-grade.  Right axillary lymph node evaluation showed 1 out of 8 lymph nodes positive for malignancy.  Oncotype test resulted at 24, benefit of chemotherapy cannot be excluded. During her last visit, I recommended, given young age, high-grade, Oncotype of 24 and positive lymph node involvement.  She is agreeable to proceeding with chemotherapy.  We have discussed about her dose dense AC followed by T.  She could not have her port placed until September 15.  Hence we will defer chemotherapy to the following week.  She was not interested in the cold cap.  She is not eligible for the neuropathy study.  She will return to clinic as scheduled.  She understands the side effects of chemotherapy.  She will also have the formal chemo teach today.  All her questions were answered to the best of my knowledge.

## 2021-11-04 NOTE — Progress Notes (Signed)
[facility] NOTE  Patient Care Team: Pcp, No as PCP - General Rockwell Germany, RN as Oncology Nurse Navigator Mauro Kaufmann, RN as Oncology Nurse Navigator [name], MD as Consulting Physician (Hematology and Oncology) Jovita Kussmaul, MD as Consulting Physician (General Surgery) Kyung Rudd, MD as Consulting Physician (Radiation Oncology)  CHIEF COMPLAINTS/PURPOSE OF CONSULTATION:  Newly diagnosed breast cancer  HISTORY OF PRESENTING ILLNESS:  Hannah Murray 46 y.o. female is here because of recent diagnosis of right breast cancer  I reviewed her records extensively and collaborated the history with the patient.  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 Indeterminate mass in the right breast at 12:30, which may represent a fibroadenoma. No evidence of right axillary adenopathy.   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  Report  dates are Jul 10, 2021 and Jul 18, 2021.   The STAT Breast cancer panel offered by [facility] includes sequencing and rearrangement analysis for the following 9 genes:  ATM, BRCA1, BRCA2, CDH1, CHEK2, PALB2, PTEN, STK11 and TP53.   The Common Hereditary Cancers + RNA Panel offered by [facility] includes sequencing, deletion/duplication, and RNA testing of the following 47 genes: APC, ATM, AXIN2, BARD1, BMPR1A, BRCA1, BRCA2, BRIP1, CDH1, CDK4*, CDKN2A (p14ARF)*, CDKN2A (p16INK4a)*, CHEK2, CTNNA1, DICER1, EPCAM (Deletion/duplication testing only), GREM1 (promoter region deletion/duplication testing only), KIT, MEN1, MLH1, MSH2, MSH3, MSH6, MUTYH, NBN, NF1, NHTL1, PALB2, PDGFRA*, PMS2, POLD1, POLE, PTEN, RAD50, RAD51C, RAD51D, SDHB, SDHC, SDHD, SMAD4, SMARCA4. STK11, TP53, TSC1, TSC2, and VHL.  The following genes were evaluated for sequence changes only: SDHA and HOXB13 c.251G>A variant only.  RNA analysis is not performed for the * genes.     09/30/2021 Pathology Results   Pathology from the right breast showed invasive ductal carcinoma grade 3 out of 3 4.3 cm in greatest dimension, margins negative grade 3 of 3 solid type DCIS as well.  Right axillary lymph node evaluation showed 1 out of 8 lymph nodes positive for malignancy., final pathologic staging T2N1A.    11/11/2021 -  Chemotherapy   Patient is on Treatment Plan : BREAST ADJUVANT DOSE DENSE AC q14d / PACLitaxel q7d     Breast cancer ([place])  09/30/2021 Initial Diagnosis   Breast cancer ([place])   09/30/2021 Pathology Results   Pathology from the right breast showed invasive ductal carcinoma grade 3 out of 3 4.3 cm in greatest dimension, margins negative grade 3 of 3 solid type DCIS as well.  Right axillary lymph node evaluation showed 1 out of 8  lymph nodes positive for malignancy., final pathologic staging T2N1A.     A certified Patent attorney used for the conversation.   She is here to meet as before planned for cycle of chemotherapy.  Unfortunately  she did not have a port placed yet, Dr. Marlou Starks is unavailable till September 15.  She would rather more to the port and then start chemo the week after.  She had no questions today except regarding some antinausea medication.  She is healing well from her recent surgery.  She denies coldcap. Rest of the pertinent 10 point ROS reviewed and negative  MEDICAL HISTORY:  Past medical history significant for diabetes mellitus, history of DVT, end-stage liver disease, heart valve disease, esophageal varices, hypertension and dyslipidemia per Dr. Ethlyn Gallery note  SURGICAL HISTORY: Past Surgical History:  Procedure Laterality Date   BREAST RECONSTRUCTION WITH PLACEMENT OF TISSUE EXPANDER AND FLEX HD (ACELLULAR HYDRATED DERMIS) Right 09/30/2021   Procedure: BREAST RECONSTRUCTION WITH PLACEMENT OF TISSUE EXPANDER AND FLEX HD (ACELLULAR HYDRATED DERMIS);  Surgeon: Wallace Going, DO;  Location: [facility];  Service: Plastics;  Laterality: Right;   MASTECTOMY W/ SENTINEL NODE BIOPSY Right 09/30/2021   Procedure: RIGHT MASTECTOMY WITH SENTINEL LYMPH NODE BIOPSY;  Surgeon: Jovita Kussmaul, MD;  Location: [facility];  Service: General;  Laterality: Right;   NO PAST SURGERIES      SOCIAL HISTORY: Social History   Socioeconomic History   Marital status: Married    Spouse name: Not on file   Number of children: 1   Years of education: Not on file   Highest education level: High school graduate  Occupational History   Not on file  Tobacco Use   Smoking status: Never   Smokeless tobacco: Never  Vaping Use   Vaping Use: Never used  Substance and Sexual Activity   Alcohol use: Yes    Comment: occasionally   Drug use: Never   Sexual activity: Not Currently  Other Topics Concern   Not on file  Social History Narrative   Not on file   Social Determinants of Health   Financial Resource Strain: Not on file  Food Insecurity: No Food Insecurity (04/30/2021)   Hunger Vital Sign     Worried About Running Out of Food in the Last Year: Never true    Ran Out of Food in the Last Year: Never true  Transportation Needs: No Transportation Needs (04/30/2021)   PRAPARE - Hydrologist (Medical): No    Lack of Transportation (Non-Medical): No  Physical Activity: Not on file  Stress: Not on file  Social Connections: Not on file  Intimate Partner Violence: Not At Risk (07/02/2021)   Humiliation, Afraid, Rape, and Kick questionnaire    Fear of Current or Ex-Partner: No    Emotionally Abused: No    Physically Abused: No    Sexually Abused: No    FAMILY HISTORY: Family History  Problem Relation Age of Onset   Hypertension Father    Diabetes Father    Thyroid disease Father    Ovarian cancer Other        MGM's niece; d. > 25    ALLERGIES:  has No Known Allergies.  MEDICATIONS:  Current Outpatient Medications  Medication Sig Dispense Refill   dexamethasone (DECADRON) 4 MG tablet Take 2 tablets (8 mg total) by mouth daily for 3 days. Start the day after doxorubicin/cyclophosphamide chemotherapy. Take with food. 30 tablet 1   lidocaine-prilocaine (EMLA)  cream Apply to affected area once 30 g 3   ondansetron (ZOFRAN) 8 MG tablet Take 1 tab (8 mg) by mouth every 8 hrs as needed for nausea/vomiting. Start third day after doxorubicin/cyclophosphamide chemotherapy. 30 tablet 1   prochlorperazine (COMPAZINE) 10 MG tablet Take 1 tablet (10 mg total) by mouth every 6 (six) hours as needed for nausea or vomiting. 30 tablet 1   No current facility-administered medications for this visit.    REVIEW OF SYSTEMS:   Constitutional: Denies fevers, chills or abnormal night sweats Eyes: Denies blurriness of vision, double vision or watery eyes Ears, nose, mouth, throat, and face: Denies mucositis or sore throat Respiratory: Denies cough, dyspnea or wheezes Cardiovascular: Denies palpitation, chest discomfort or lower extremity swelling Gastrointestinal:   Denies nausea, heartburn or change in bowel habits Skin: Denies abnormal skin rashes Lymphatics: Denies new lymphadenopathy or easy bruising Neurological:Denies numbness, tingling or new weaknesses Behavioral/Psych: Mood is stable, no new changes  Breast: Denies any palpable lumps or discharge All other systems were reviewed with the patient and are negative.  PHYSICAL EXAMINATION: ECOG PERFORMANCE STATUS: 0 - Asymptomatic  Vitals:   11/04/21 1149  BP: 128/71  Pulse: 90  Resp: 18  Temp: 98.1 F (36.7 C)  SpO2: 100%   Filed Weights   11/04/21 1149  Weight: 147 lb 6.4 oz (66.9 kg)    GENERAL:alert, no distress and comfortable Right mastectomy site appears healed to be healing well  LABORATORY DATA:  I have reviewed the data as listed Lab Results  Component Value Date   WBC 6.2 11/04/2021   HGB 13.3 11/04/2021   HCT 40.3 11/04/2021   MCV 87.0 11/04/2021   PLT 220 11/04/2021   Lab Results  Component Value Date   NA 135 11/04/2021   K 4.0 11/04/2021   CL 104 11/04/2021   CO2 28 11/04/2021    RADIOGRAPHIC STUDIES: I have personally reviewed the radiological reports and agreed with the findings in the report.  ASSESSMENT AND PLAN:  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, right [facility]) This is a 46 year old female patient with newly diagnosed right breast IDC, ER/PR positive, HER2 negative, clinically stage T2 N0 M0 grade 2/3 referred to medical oncology for recommendations.  Since last visit she had mastectomy which showed grade 3 invasive ductal carcinoma measuring 4.3 cm in greatest dimension, margins negative along with solid type DCIS high-grade.  Right axillary lymph node evaluation showed 1 out of 8 lymph nodes positive for malignancy.  Oncotype test resulted at 24, benefit of chemotherapy cannot be excluded. During her last visit, I recommended, given young age, high-grade, Oncotype of 24 and positive lymph node involvement.  She is agreeable  to proceeding with chemotherapy.  We have discussed about her dose dense AC followed by T.  She could not have her port placed until September 15.  Hence we will defer chemotherapy to the following week.  She was not interested in the cold cap.  She is not eligible for the neuropathy study.  She will return to clinic as scheduled.  She understands the side effects of chemotherapy.  She will also have the formal chemo teach today.  All her questions were answered to the best of my knowledge.  Total time spent: 20 minutes including history, review of records, counseling and coordination of care All questions were answered. The patient knows to call the clinic with any problems, questions or concerns.    [name], MD 11/04/21

## 2021-11-05 ENCOUNTER — Encounter (HOSPITAL_BASED_OUTPATIENT_CLINIC_OR_DEPARTMENT_OTHER): Payer: Self-pay | Admitting: General Surgery

## 2021-11-05 ENCOUNTER — Encounter: Payer: Self-pay | Admitting: *Deleted

## 2021-11-05 ENCOUNTER — Other Ambulatory Visit: Payer: Self-pay

## 2021-11-05 ENCOUNTER — Inpatient Hospital Stay: Payer: No Typology Code available for payment source

## 2021-11-07 ENCOUNTER — Inpatient Hospital Stay: Payer: No Typology Code available for payment source

## 2021-11-07 ENCOUNTER — Encounter: Payer: Self-pay | Admitting: Hematology and Oncology

## 2021-11-08 ENCOUNTER — Encounter (HOSPITAL_BASED_OUTPATIENT_CLINIC_OR_DEPARTMENT_OTHER): Payer: Self-pay | Admitting: General Surgery

## 2021-11-08 ENCOUNTER — Ambulatory Visit (HOSPITAL_COMMUNITY): Payer: No Typology Code available for payment source

## 2021-11-08 ENCOUNTER — Ambulatory Visit (HOSPITAL_BASED_OUTPATIENT_CLINIC_OR_DEPARTMENT_OTHER)
Admission: RE | Admit: 2021-11-08 | Discharge: 2021-11-08 | Disposition: A | Discharge status: Home / Self Care | Payer: No Typology Code available for payment source | Attending: General Surgery | Admitting: General Surgery

## 2021-11-08 ENCOUNTER — Ambulatory Visit (HOSPITAL_BASED_OUTPATIENT_CLINIC_OR_DEPARTMENT_OTHER): Payer: No Typology Code available for payment source | Admitting: Anesthesiology

## 2021-11-08 ENCOUNTER — Other Ambulatory Visit: Payer: Self-pay

## 2021-11-08 ENCOUNTER — Encounter (HOSPITAL_BASED_OUTPATIENT_CLINIC_OR_DEPARTMENT_OTHER)
Admission: RE | Disposition: A | Discharge status: Home / Self Care | Payer: Self-pay | Source: Home / Self Care | Attending: General Surgery

## 2021-11-08 DIAGNOSIS — Z01818 Encounter for other preprocedural examination: Secondary | ICD-10-CM

## 2021-11-08 DIAGNOSIS — C50411 Malignant neoplasm of upper-outer quadrant of right female breast: Secondary | ICD-10-CM | POA: Insufficient documentation

## 2021-11-08 DIAGNOSIS — Z170 Estrogen receptor positive status [ER+]: Secondary | ICD-10-CM | POA: Insufficient documentation

## 2021-11-08 DIAGNOSIS — Z9011 Acquired absence of right breast and nipple: Secondary | ICD-10-CM | POA: Insufficient documentation

## 2021-11-08 DIAGNOSIS — C50911 Malignant neoplasm of unspecified site of right female breast: Secondary | ICD-10-CM

## 2021-11-08 HISTORY — PX: PORTACATH PLACEMENT: SHX2246

## 2021-11-08 SURGERY — INSERTION, TUNNELED CENTRAL VENOUS DEVICE, WITH PORT
Anesthesia: General | Site: Chest | Laterality: Left

## 2021-11-08 MED ORDER — HEPARIN SOD (PORK) LOCK FLUSH 100 UNIT/ML IV SOLN
INTRAVENOUS | Status: DC | PRN
  Administered 2021-11-08: 450 [IU] via INTRAVENOUS

## 2021-11-08 MED ORDER — DEXAMETHASONE SODIUM PHOSPHATE 10 MG/ML IJ SOLN
INTRAMUSCULAR | Status: AC
  Filled 2021-11-08: qty 1

## 2021-11-08 MED ORDER — ONDANSETRON HCL 4 MG/2ML IJ SOLN
INTRAMUSCULAR | Status: AC
  Filled 2021-11-08: qty 2

## 2021-11-08 MED ORDER — SCOPOLAMINE 1 MG/3DAYS TD PT72
1.0000 | MEDICATED_PATCH | TRANSDERMAL | Status: DC
  Administered 2021-11-08: 1.5 mg via TRANSDERMAL

## 2021-11-08 MED ORDER — LIDOCAINE HCL (CARDIAC) PF 100 MG/5ML IV SOSY
PREFILLED_SYRINGE | INTRAVENOUS | Status: DC | PRN
  Administered 2021-11-08: 40 mg via INTRAVENOUS

## 2021-11-08 MED ORDER — OXYCODONE HCL 5 MG PO TABS: 5.0000 mg | ORAL_TABLET | Freq: Four times a day (QID) | ORAL | 0 refills | Status: DC | PRN

## 2021-11-08 MED ORDER — ACETAMINOPHEN 500 MG PO TABS
1000.0000 mg | ORAL_TABLET | Freq: Once | ORAL | Status: AC
  Administered 2021-11-08: 1000 mg via ORAL

## 2021-11-08 MED ORDER — LACTATED RINGERS IV SOLN: INTRAVENOUS | Status: DC

## 2021-11-08 MED ORDER — PROPOFOL 10 MG/ML IV BOLUS
INTRAVENOUS | Status: AC
  Filled 2021-11-08: qty 20

## 2021-11-08 MED ORDER — DEXAMETHASONE SODIUM PHOSPHATE 10 MG/ML IJ SOLN
INTRAMUSCULAR | Status: DC | PRN
  Administered 2021-11-08: 10 mg via INTRAVENOUS

## 2021-11-08 MED ORDER — SUCCINYLCHOLINE CHLORIDE 200 MG/10ML IV SOSY
PREFILLED_SYRINGE | INTRAVENOUS | Status: AC
  Filled 2021-11-08: qty 10

## 2021-11-08 MED ORDER — MIDAZOLAM HCL 2 MG/2ML IJ SOLN
INTRAMUSCULAR | Status: AC
  Filled 2021-11-08: qty 2

## 2021-11-08 MED ORDER — HEPARIN (PORCINE) IN NACL 2-0.9 UNITS/ML
INTRAMUSCULAR | Status: AC | PRN
  Administered 2021-11-08: 1 via INTRAVENOUS

## 2021-11-08 MED ORDER — MIDAZOLAM HCL 5 MG/5ML IJ SOLN
INTRAMUSCULAR | Status: DC | PRN
  Administered 2021-11-08: 2 mg via INTRAVENOUS

## 2021-11-08 MED ORDER — FENTANYL CITRATE (PF) 100 MCG/2ML IJ SOLN: 25.0000 ug | INTRAMUSCULAR | Status: DC | PRN

## 2021-11-08 MED ORDER — ACETAMINOPHEN 500 MG PO TABS
ORAL_TABLET | ORAL | Status: AC
  Filled 2021-11-08: qty 2

## 2021-11-08 MED ORDER — KETOROLAC TROMETHAMINE 30 MG/ML IJ SOLN
INTRAMUSCULAR | Status: AC
  Filled 2021-11-08: qty 1

## 2021-11-08 MED ORDER — ONDANSETRON HCL 4 MG/2ML IJ SOLN
INTRAMUSCULAR | Status: DC | PRN
  Administered 2021-11-08: 4 mg via INTRAVENOUS

## 2021-11-08 MED ORDER — CEFAZOLIN SODIUM-DEXTROSE 2-3 GM-%(50ML) IV SOLR
INTRAVENOUS | Status: DC | PRN
  Administered 2021-11-08: 2 g via INTRAVENOUS

## 2021-11-08 MED ORDER — CEFAZOLIN SODIUM-DEXTROSE 2-4 GM/100ML-% IV SOLN
INTRAVENOUS | Status: AC
  Filled 2021-11-08: qty 100

## 2021-11-08 MED ORDER — OXYCODONE HCL 5 MG/5ML PO SOLN: 5.0000 mg | Freq: Once | ORAL | Status: DC | PRN

## 2021-11-08 MED ORDER — MIDAZOLAM HCL 2 MG/2ML IJ SOLN: 0.5000 mg | Freq: Once | INTRAMUSCULAR | Status: DC | PRN

## 2021-11-08 MED ORDER — SCOPOLAMINE 1 MG/3DAYS TD PT72
MEDICATED_PATCH | TRANSDERMAL | Status: AC
  Filled 2021-11-08: qty 1

## 2021-11-08 MED ORDER — ATROPINE SULFATE 0.4 MG/ML IV SOLN
INTRAVENOUS | Status: AC
  Filled 2021-11-08: qty 2

## 2021-11-08 MED ORDER — PROMETHAZINE HCL 25 MG/ML IJ SOLN: 6.2500 mg | INTRAMUSCULAR | Status: DC | PRN

## 2021-11-08 MED ORDER — BUPIVACAINE-EPINEPHRINE 0.25% -1:200000 IJ SOLN
INTRAMUSCULAR | Status: DC | PRN
  Administered 2021-11-08: 6 mL

## 2021-11-08 MED ORDER — FENTANYL CITRATE (PF) 100 MCG/2ML IJ SOLN
INTRAMUSCULAR | Status: DC | PRN
  Administered 2021-11-08 (×2): 25 ug via INTRAVENOUS

## 2021-11-08 MED ORDER — PROPOFOL 10 MG/ML IV BOLUS
INTRAVENOUS | Status: DC | PRN
  Administered 2021-11-08: 200 mg via INTRAVENOUS

## 2021-11-08 MED ORDER — MEPERIDINE HCL 25 MG/ML IJ SOLN: 6.2500 mg | INTRAMUSCULAR | Status: DC | PRN

## 2021-11-08 MED ORDER — FENTANYL CITRATE (PF) 100 MCG/2ML IJ SOLN
INTRAMUSCULAR | Status: AC
  Filled 2021-11-08: qty 2

## 2021-11-08 MED ORDER — PHENYLEPHRINE 80 MCG/ML (10ML) SYRINGE FOR IV PUSH (FOR BLOOD PRESSURE SUPPORT)
PREFILLED_SYRINGE | INTRAVENOUS | Status: AC
  Filled 2021-11-08: qty 10

## 2021-11-08 MED ORDER — LIDOCAINE 2% (20 MG/ML) 5 ML SYRINGE
INTRAMUSCULAR | Status: AC
  Filled 2021-11-08: qty 5

## 2021-11-08 MED ORDER — OXYCODONE HCL 5 MG PO TABS: 5.0000 mg | ORAL_TABLET | Freq: Once | ORAL | Status: DC | PRN

## 2021-11-08 SURGICAL SUPPLY — 45 items
ADH SKN CLS APL DERMABOND .7 (GAUZE/BANDAGES/DRESSINGS) ×1
APL PRP STRL LF DISP 70% ISPRP (MISCELLANEOUS) ×1
BAG DECANTER FOR FLEXI CONT (MISCELLANEOUS) ×1 IMPLANT
BLADE SURG 15 STRL LF DISP TIS (BLADE) ×1 IMPLANT
BLADE SURG 15 STRL SS (BLADE) ×1
CANISTER SUCT 1200ML W/VALVE (MISCELLANEOUS) IMPLANT
CHLORAPREP W/TINT 26 (MISCELLANEOUS) ×1 IMPLANT
CLEANER CAUTERY TIP 5X5 PAD (MISCELLANEOUS) ×1 IMPLANT
COVER BACK TABLE 60X90IN (DRAPES) ×1 IMPLANT
COVER MAYO STAND STRL (DRAPES) ×1 IMPLANT
DERMABOND ADVANCED .7 DNX12 (GAUZE/BANDAGES/DRESSINGS) ×1 IMPLANT
DRAPE C-ARM 42X72 X-RAY (DRAPES) ×1 IMPLANT
DRAPE LAPAROSCOPIC ABDOMINAL (DRAPES) ×1 IMPLANT
DRAPE UTILITY XL STRL (DRAPES) ×1 IMPLANT
ELECT REM PT RETURN 9FT ADLT (ELECTROSURGICAL) ×1
ELECTRODE REM PT RTRN 9FT ADLT (ELECTROSURGICAL) ×1 IMPLANT
GAUZE 4X4 16PLY ~~LOC~~+RFID DBL (SPONGE) ×1 IMPLANT
GLOVE BIO SURGEON STRL SZ7.5 (GLOVE) ×1 IMPLANT
GOWN STRL REUS W/ TWL LRG LVL3 (GOWN DISPOSABLE) ×2 IMPLANT
GOWN STRL REUS W/TWL LRG LVL3 (GOWN DISPOSABLE) ×2
IV KIT MINILOC 20X1 SAFETY (NEEDLE) IMPLANT
KIT PORT POWER 8FR ISP CVUE (Port) IMPLANT
NDL HYPO 25X1 1.5 SAFETY (NEEDLE) ×1 IMPLANT
NDL SAFETY ECLIP 18X1.5 (MISCELLANEOUS) IMPLANT
NDL SPNL 22GX3.5 QUINCKE BK (NEEDLE) IMPLANT
NEEDLE HYPO 22GX1.5 SAFETY (NEEDLE) IMPLANT
NEEDLE HYPO 25X1 1.5 SAFETY (NEEDLE) ×1 IMPLANT
NEEDLE SPNL 22GX3.5 QUINCKE BK (NEEDLE) IMPLANT
PACK BASIN DAY SURGERY FS (CUSTOM PROCEDURE TRAY) ×1 IMPLANT
PAD CLEANER CAUTERY TIP 5X5 (MISCELLANEOUS) ×1
PENCIL SMOKE EVACUATOR (MISCELLANEOUS) ×1 IMPLANT
SLEEVE SCD COMPRESS KNEE MED (STOCKING) IMPLANT
SPIKE FLUID TRANSFER (MISCELLANEOUS) IMPLANT
SUT MON AB 4-0 PC3 18 (SUTURE) ×1 IMPLANT
SUT PROLENE 2 0 SH DA (SUTURE) ×1 IMPLANT
SUT SILK 2 0 TIES 17X18 (SUTURE)
SUT SILK 2-0 18XBRD TIE BLK (SUTURE) IMPLANT
SUT VIC AB 3-0 SH 27 (SUTURE) ×1
SUT VIC AB 3-0 SH 27X BRD (SUTURE) ×1 IMPLANT
SYR 10ML LL (SYRINGE) IMPLANT
SYR 5ML LL (SYRINGE) ×1 IMPLANT
SYR CONTROL 10ML LL (SYRINGE) ×2 IMPLANT
TOWEL GREEN STERILE FF (TOWEL DISPOSABLE) ×2 IMPLANT
TUBE CONNECTING 20X1/4 (TUBING) IMPLANT
YANKAUER SUCT BULB TIP NO VENT (SUCTIONS) IMPLANT

## 2021-11-08 NOTE — H&P (Signed)
MRN: [id number] DOB: [birth year] Subjective   Chief Complaint: Post Operative Visit (R breast cancer)   History of Present Illness: Hannah Murray is a 46 y.o. female who is seen today for right breast cancer. The patient is a 46 year old Hispanic female who is about 4 weeks status post right mastectomy and sentinel node biopsy for a T2N1A right breast cancer that was ER and PR positive and HER2 negative with a Ki-67 of 40%. Her margins were clean. She had 1 of 8 nodes that were positive. She tolerated the surgery well. She denies any significant pain.    Review of Systems: A complete review of systems was obtained from the patient. I have reviewed this information and discussed as appropriate with the patient. See HPI as well for other ROS.  ROS   Medical History: Past Medical History:  Diagnosis Date  History of cancer   Patient Active Problem List  Diagnosis  Malignant neoplasm of upper-outer quadrant of right breast in female, estrogen receptor positive (CMS-HCC)   History reviewed. No pertinent surgical history.   No Known Allergies  No current outpatient medications on file prior to visit.   No current facility-administered medications on file prior to visit.   Family History  Problem Relation Age of Onset  Diabetes Mother    Social History   Tobacco Use  Smoking Status Never  Smokeless Tobacco Never    Social History   Socioeconomic History  Marital status: Married  Tobacco Use  Smoking status: Never  Smokeless tobacco: Never  Vaping Use  Vaping Use: Never used  Substance and Sexual Activity  Alcohol use: Never  Drug use: Never   Objective:   There were no vitals filed for this visit.  There is no height or weight on file to calculate BMI.  Physical Exam Vitals reviewed.  Constitutional:  General: She is not in acute distress. Appearance: Normal appearance.  HENT:  Head: Normocephalic and atraumatic.  Right Ear: External ear normal.  Left  Ear: External ear normal.  Nose: Nose normal.  Mouth/Throat:  Mouth: Mucous membranes are moist.  Pharynx: Oropharynx is clear.  Eyes:  General: No scleral icterus. Extraocular Movements: Extraocular movements intact.  Conjunctiva/sclera: Conjunctivae normal.  Pupils: Pupils are equal, round, and reactive to light.  Cardiovascular:  Rate and Rhythm: Normal rate and regular rhythm.  Pulses: Normal pulses.  Heart sounds: Normal heart sounds.  Pulmonary:  Effort: Pulmonary effort is normal. No respiratory distress.  Breath sounds: Normal breath sounds.  Abdominal:  General: Bowel sounds are normal.  Palpations: Abdomen is soft.  Tenderness: There is no abdominal tenderness.  Musculoskeletal:  General: No swelling, tenderness or deformity. Normal range of motion.  Cervical back: Normal range of motion and neck supple.  Skin: General: Skin is warm and dry.  Coloration: Skin is not jaundiced.  Neurological:  General: No focal deficit present.  Mental Status: She is alert and oriented to person, place, and time.  Psychiatric:  Mood and Affect: Mood normal.  Behavior: Behavior normal.    Breast: The right mastectomy incision is healing nicely with no sign of infection or seroma. The skin flaps are healthy.  Labs, Imaging and Diagnostic Testing:  Assessment and Plan:   Diagnoses and all orders for this visit:  Malignant neoplasm of upper-outer quadrant of right breast in female, estrogen receptor positive (CMS-HCC)    The patient is about 4 weeks status post right mastectomy for breast cancer. She tolerated the surgery well. At this point she will follow-up  with medical and radiation oncology for adjuvant therapy. I will plan to see her back in about 6 months. She returns today for port placement. I have discussed with her in detail the risks and benefits of the surgery as well as some of the technical aspects and she understands and wishes to proceed

## 2021-11-08 NOTE — Interval H&P Note (Signed)
History and Physical Interval Note:  11/08/2021 2:37 PM  Hannah Murray  has presented today for surgery, with the diagnosis of RIGHT BREAST CANCER.  The various methods of treatment have been discussed with the patient and family. After consideration of risks, benefits and other options for treatment, the patient has consented to  Procedure(s): INSERTION PORT-A-CATH (N/A) as a surgical intervention.  The patient's history has been reviewed, patient examined, no change in status, stable for surgery.  I have reviewed the patient's chart and labs.  Questions were answered to the patient's satisfaction.     [name]

## 2021-11-08 NOTE — Op Note (Signed)
11/08/2021  3:34 PM  PATIENT:  Hannah Murray  46 y.o. female  PRE-OPERATIVE DIAGNOSIS:  Right breast cancer  POST-OPERATIVE DIAGNOSIS:  Right breast cancer  PROCEDURE:  Procedure(s): INSERTION PORT-A-CATH (Left)  SURGEON:  Surgeon(s) and Role:    * Jovita Kussmaul, MD - Primary  PHYSICIAN ASSISTANT:   ASSISTANTS: none   ANESTHESIA:   local and general  EBL:  5 mL   BLOOD ADMINISTERED:none  DRAINS: none   LOCAL MEDICATIONS USED:  MARCAINE     SPECIMEN:  No Specimen  DISPOSITION OF SPECIMEN:  N/A  COUNTS:  YES  TOURNIQUET:  * No tourniquets in log *  DICTATION: .Dragon Dictation  After informed consent was obtained the patient was brought to the operating room and placed in the supine position on the operating table.  After adequate induction of general anesthesia a roll was placed between the patient's shoulder blades to extend the shoulder slightly.  The left chest and neck area were then prepped with ChloraPrep, allowed to dry, and draped in usual sterile manner.  An appropriate timeout was performed.  The area lateral to the bend of the clavicle on the left chest wall was infiltrated with quarter percent Marcaine.  The patient was placed in Trendelenburg position.  A large bore needle from the Port-A-Cath kit was used to slide beneath the bend of the clavicle heading towards the sternal notch and in doing so I was able to access the left subclavian vein without difficulty.  A wire was fed through the needle using the Seldinger technique without difficulty.  The wire was confirmed in the central venous system using real-time fluoroscopy.  Next a small incision was made at the wire entry site with a 15 blade knife.  The incision was carried through the skin and subcutaneous tissue sharply with the electrocautery.  A subcutaneous pocket was then created inferior to the incision by blunt finger dissection.  Next the tubing was placed on the reservoir.  The reservoir was placed in  the pocket and the length of the tubing was estimated using real-time fluoroscopy.  The tubing was cut to the appropriate length.  Next a sheath and dilator were fed over the wire using the Seldinger technique without difficulty.  The dilator and wire were removed from the patient.  The tubing was fed through the sheath as far as it would go and then held in place while the sheath was gently cracked and separated.  Another real-time fluoroscopy image showed the tip of the catheter to be in the distal superior vena cava.  The tubing was then permanently anchored to the reservoir.  The reservoir was anchored in the pocket with two 2-0 Prolene stitches.  The port was then aspirated and it aspirated blood easily.  The port was then flushed initially with a dilute heparin solution and then with a more concentrated heparin solution.  The subcutaneous tissue was closed over the port with interrupted 3-0 Vicryl stitches.  The skin was closed with a running 4-0 Monocryl subcuticular stitch.  Dermabond dressings were applied.  The patient tolerated the procedure well.  At the end of the case all needle sponge and instrument counts were correct.  The patient was then awakened and taken to recovery in stable condition.  PLAN OF CARE: Discharge to home after PACU  PATIENT DISPOSITION:  PACU - hemodynamically stable.   Delay start of Pharmacological VTE agent (>24hrs) due to surgical blood loss or risk of bleeding: not applicable

## 2021-11-08 NOTE — Anesthesia Preprocedure Evaluation (Addendum)
Anesthesia Evaluation  Patient identified by MRN, date of birth, ID band Patient awake    Reviewed: Allergy & Precautions, NPO status , Patient's Chart, lab work & pertinent test results  History of Anesthesia Complications Negative for: history of anesthetic complications  Airway Mallampati: II  TM Distance: >3 FB Neck ROM: Full    Dental  (+) Dental Advisory Given   Pulmonary neg pulmonary ROS,    breath sounds clear to auscultation       Cardiovascular (-) angina Rhythm:Regular Rate:Normal  11/04/2021 ECHO: EF 55-60%, normal LVF, normal RVF, no significant valvular abnormalities   Neuro/Psych negative neurological ROS     GI/Hepatic negative GI ROS, Neg liver ROS,   Endo/Other  negative endocrine ROS  Renal/GU negative Renal ROS     Musculoskeletal   Abdominal   Peds  Hematology negative hematology ROS (+)   Anesthesia Other Findings Breast cancer  Reproductive/Obstetrics LMP presently                           Anesthesia Physical Anesthesia Plan  ASA: 2  Anesthesia Plan: General   Post-op Pain Management: Tylenol PO (pre-op)*   Induction: Intravenous  PONV Risk Score and Plan: 3 and Ondansetron, Dexamethasone and Scopolamine patch - Pre-op  Airway Management Planned: LMA  Additional Equipment: None  Intra-op Plan:   Post-operative Plan:   Informed Consent: I have reviewed the patients History and Physical, chart, labs and discussed the procedure including the risks, benefits and alternatives for the proposed anesthesia with the patient or authorized representative who has indicated his/her understanding and acceptance.     Dental advisory given  Plan Discussed with: CRNA and Surgeon  Anesthesia Plan Comments:        Anesthesia Quick Evaluation

## 2021-11-08 NOTE — Discharge Instructions (Addendum)
  Post Anesthesia Home Care Instructions  Activity: Get plenty of rest for the remainder of the day. A responsible individual must stay with you for 24 hours following the procedure.  For the next 24 hours, DO NOT: -Drive a car -Paediatric nurse -Drink alcoholic beverages -Take any medication unless instructed by your physician -Make any legal decisions or sign important papers.  Meals: Start with liquid foods such as gelatin or soup. Progress to regular foods as tolerated. Avoid greasy, spicy, heavy foods. If nausea and/or vomiting occur, drink only clear liquids until the nausea and/or vomiting subsides. Call your physician if vomiting continues.  Special Instructions/Symptoms: Your throat may feel dry or sore from the anesthesia or the breathing tube placed in your throat during surgery. If this causes discomfort, gargle with warm salt water. The discomfort should disappear within 24 hours.  If you had a scopolamine patch placed behind your ear for the management of post- operative nausea and/or vomiting:  1. The medication in the patch is effective for 72 hours, after which it should be removed.  Wrap patch in a tissue and discard in the trash. Wash hands thoroughly with soap and water. 2. You may remove the patch earlier than 72 hours if you experience unpleasant side effects which may include dry mouth, dizziness or visual disturbances. 3. Avoid touching the patch. Wash your hands with soap and water after contact with the patch.  No tylenol until after 8pm tonight if needed.

## 2021-11-08 NOTE — Transfer of Care (Signed)
Immediate Anesthesia Transfer of Care Note  Patient: Hannah Murray  Procedure(s) Performed: INSERTION PORT-A-CATH (Left: Chest)  Patient Location: PACU  Anesthesia Type:General  Level of Consciousness: drowsy  Airway & Oxygen Therapy: Patient Spontanous Breathing and Patient connected to face mask oxygen  Post-op Assessment: Report given to RN and Post -op Vital signs reviewed and stable  Post vital signs: Reviewed and stable  Last Vitals:  Vitals Value Taken Time  BP 111/64 11/08/21 1542  Temp    Pulse 61 11/08/21 1542  Resp 13 11/08/21 1542  SpO2 100 % 11/08/21 1542  Vitals shown include unvalidated device data.  Last Pain:  Vitals:   11/08/21 1352  TempSrc: Oral  PainSc: 0-No pain      Patients Stated Pain Goal: 3 (57/47/34 0370)  Complications: No notable events documented.

## 2021-11-08 NOTE — Anesthesia Procedure Notes (Signed)
Procedure Name: LMA Insertion Date/Time: 11/08/2021 2:57 PM  Performed by: Lavonia Dana, CRNAPre-anesthesia Checklist: Patient identified, Emergency Drugs available, Suction available and Patient being monitored Patient Re-evaluated:Patient Re-evaluated prior to induction Oxygen Delivery Method: Circle system utilized Preoxygenation: Pre-oxygenation with 100% oxygen Induction Type: IV induction Ventilation: Mask ventilation without difficulty LMA: LMA inserted LMA Size: 4.0 Number of attempts: 1 Airway Equipment and Method: Bite block Placement Confirmation: positive ETCO2 Tube secured with: Tape Dental Injury: Teeth and Oropharynx as per pre-operative assessment

## 2021-11-08 NOTE — Anesthesia Postprocedure Evaluation (Signed)
Anesthesia Post Note  Patient: Hannah Murray  Procedure(s) Performed: INSERTION PORT-A-CATH (Left: Chest)     Patient location during evaluation: PACU Anesthesia Type: General Level of consciousness: awake and alert, patient cooperative and oriented Pain management: pain level controlled Vital Signs Assessment: post-procedure vital signs reviewed and stable Respiratory status: spontaneous breathing, nonlabored ventilation and respiratory function stable Cardiovascular status: blood pressure returned to baseline and stable Postop Assessment: no apparent nausea or vomiting, adequate PO intake and able to ambulate Anesthetic complications: no   No notable events documented.  Last Vitals:  Vitals:   11/08/21 1545 11/08/21 1600  BP: 114/66 117/77  Pulse: 80 72  Resp: 15 12  Temp:    SpO2: 100% 98%    Last Pain:  Vitals:   11/08/21 1600  TempSrc:   PainSc: 0-No pain                 Maritza Hosterman,E. Tashauna Caisse

## 2021-11-11 ENCOUNTER — Inpatient Hospital Stay: Payer: No Typology Code available for payment source

## 2021-11-11 ENCOUNTER — Encounter: Payer: Self-pay | Admitting: *Deleted

## 2021-11-11 ENCOUNTER — Other Ambulatory Visit: Payer: Self-pay

## 2021-11-11 VITALS — BP 129/66 | HR 71 | Temp 98.1°F | Resp 16 | Wt 148.8 lb

## 2021-11-11 DIAGNOSIS — C50411 Malignant neoplasm of upper-outer quadrant of right female breast: Secondary | ICD-10-CM

## 2021-11-11 LAB — CBC WITH DIFFERENTIAL (CANCER CENTER ONLY)
Abs Immature Granulocytes: 0.02 10*3/uL (ref 0.00–0.07)
Basophils Absolute: 0 10*3/uL (ref 0.0–0.1)
Basophils Relative: 0 %
Eosinophils Absolute: 0.2 10*3/uL (ref 0.0–0.5)
Eosinophils Relative: 3 %
HCT: 41.5 % (ref 36.0–46.0)
Hemoglobin: 13.6 g/dL (ref 12.0–15.0)
Immature Granulocytes: 0 %
Lymphocytes Relative: 21 %
Lymphs Abs: 1.5 10*3/uL (ref 0.7–4.0)
MCH: 28.3 pg (ref 26.0–34.0)
MCHC: 32.8 g/dL (ref 30.0–36.0)
MCV: 86.5 fL (ref 80.0–100.0)
Monocytes Absolute: 0.5 10*3/uL (ref 0.1–1.0)
Monocytes Relative: 7 %
Neutro Abs: 4.7 10*3/uL (ref 1.7–7.7)
Neutrophils Relative %: 69 %
Platelet Count: 236 10*3/uL (ref 150–400)
RBC: 4.8 MIL/uL (ref 3.87–5.11)
RDW: 11.6 % (ref 11.5–15.5)
WBC Count: 6.8 10*3/uL (ref 4.0–10.5)
nRBC: 0 % (ref 0.0–0.2)

## 2021-11-11 LAB — CMP (CANCER CENTER ONLY)
ALT: 10 U/L (ref 0–44)
AST: 17 U/L (ref 15–41)
Albumin: 4.2 g/dL (ref 3.5–5.0)
Alkaline Phosphatase: 40 U/L (ref 38–126)
Anion gap: 6 (ref 5–15)
BUN: 12 mg/dL (ref 6–20)
CO2: 26 mmol/L (ref 22–32)
Calcium: 9.3 mg/dL (ref 8.9–10.3)
Chloride: 107 mmol/L (ref 98–111)
Creatinine: 0.63 mg/dL (ref 0.44–1.00)
GFR, Estimated: >60 mL/min (ref >60)
Glucose, Bld: 89 mg/dL (ref 70–99)
Potassium: 4 mmol/L (ref 3.5–5.1)
Sodium: 139 mmol/L (ref 135–145)
Total Bilirubin: 0.4 mg/dL (ref 0.3–1.2)
Total Protein: 7.2 g/dL (ref 6.5–8.1)

## 2021-11-11 LAB — POCT PREGNANCY, URINE: Preg Test, Ur: NEGATIVE

## 2021-11-11 MED ORDER — HEPARIN SOD (PORK) LOCK FLUSH 100 UNIT/ML IV SOLN
500.0000 [IU] | Freq: Once | INTRAVENOUS | Status: AC | PRN
  Administered 2021-11-11: 500 [IU]

## 2021-11-11 MED ORDER — SODIUM CHLORIDE 0.9 % IV SOLN
10.0000 mg | Freq: Once | INTRAVENOUS | Status: AC
  Administered 2021-11-11: 10 mg via INTRAVENOUS
  Filled 2021-11-11: qty 10

## 2021-11-11 MED ORDER — SODIUM CHLORIDE 0.9 % IV SOLN
150.0000 mg | Freq: Once | INTRAVENOUS | Status: AC
  Administered 2021-11-11: 150 mg via INTRAVENOUS
  Filled 2021-11-11: qty 150

## 2021-11-11 MED ORDER — DOXORUBICIN HCL CHEMO IV INJECTION 2 MG/ML
60.0000 mg/m2 | Freq: Once | INTRAVENOUS | Status: AC
  Administered 2021-11-11: 106 mg via INTRAVENOUS
  Filled 2021-11-11: qty 53

## 2021-11-11 MED ORDER — SODIUM CHLORIDE 0.9 % IV SOLN
600.0000 mg/m2 | Freq: Once | INTRAVENOUS | Status: AC
  Administered 2021-11-11: 1060 mg via INTRAVENOUS
  Filled 2021-11-11: qty 53

## 2021-11-11 MED ORDER — SODIUM CHLORIDE 0.9 % IV SOLN: Freq: Once | INTRAVENOUS | Status: AC

## 2021-11-11 MED ORDER — PALONOSETRON HCL INJECTION 0.25 MG/5ML
0.2500 mg | Freq: Once | INTRAVENOUS | Status: AC
  Administered 2021-11-11: 0.25 mg via INTRAVENOUS
  Filled 2021-11-11: qty 5

## 2021-11-11 MED ORDER — SODIUM CHLORIDE 0.9% FLUSH
10.0000 mL | INTRAVENOUS | Status: DC | PRN
  Administered 2021-11-11: 10 mL

## 2021-11-11 NOTE — Progress Notes (Signed)
Left message stating courtesy call and if any questions or concerns please call the doctors office.  

## 2021-11-11 NOTE — Patient Instructions (Signed)
Instrucciones al darle de alta: Discharge Instructions Gracias por elegir al [facility] [place] para brindarle atencin mdica de oncologa y Music therapist.   Si usted tiene una cita de laboratorio con [place], por favor vaya directamente [place] y regstrese en el rea de Control and instrumentation engineer.   Use ropa cmoda y [place] para tener fcil acceso a las vas del Portacath (acceso venoso de Engineer, site duracin) o la lnea PICC (catter central colocado por va perifrica).   Nos esforzamos por ofrecerle tiempo de calidad con su proveedor. Es posible que tenga que volver a programar su cita si llega tarde (15 minutos o ms).  El llegar tarde le afecta a usted y a otros pacientes cuyas citas son posteriores a Merchandiser, retail.  Adems, si usted falta a tres o ms citas sin avisar a la oficina, puede ser retirado(a) de la clnica a discrecin del proveedor.      Para las solicitudes de renovacin de recetas, pida a su farmacia que se ponga en contacto con nuestra oficina y deje que transcurran 19 horas para que se complete el proceso de las renovaciones.    Hoy usted recibi los siguientes agentes de quimioterapia e/o inmunoterapia: doxorubicin and cyclophosphamide      Para ayudar a prevenir las nuseas y los vmitos despus de su tratamiento, le recomendamos que tome su medicamento para las nuseas segn las indicaciones.  LOS SNTOMAS QUE DEBEN COMUNICARSE INMEDIATAMENTE SE INDICAN A CONTINUACIN: *FIEBRE SUPERIOR A 100.4 F (38 C) O MS *ESCALOFROS O SUDORACIN *NUSEAS Y VMITOS QUE NO SE CONTROLAN CON EL MEDICAMENTO PARA LAS NUSEAS *DIFICULTAD INUSUAL PARA RESPIRAR  *MORETONES O HEMORRAGIAS NO HABITUALES *PROBLEMAS URINARIOS (dolor o ardor al Garment/textile technologist o frecuencia para Garment/textile technologist) *PROBLEMAS INTESTINALES (diarrea inusual, estreimiento, dolor cerca del ano) SENSIBILIDAD EN LA BOCA Y EN LA GARGANTA CON O SIN LA PRESENCIA DE LCERAS (dolor de garganta, llagas en la boca o dolor de  muelas/dientes) ERUPCIN, HINCHAZN O DOLORES INUSUALES FLUJO VAGINAL INUSUAL O PICAZN/RASQUIA    Los puntos marcados con un asterisco ( *) indican una posible emergencia y debe hacer un seguimiento tan pronto como le sea posible o vaya al Departamento de Emergencias si se le presenta algn problema.  Por favor, muestre la [place] DE ADVERTENCIA DE Windy Canny DE ADVERTENCIA DE Benay Spice al registrarse en [address] de Emergencias y a la enfermera de triaje.  Si tiene preguntas despus de su visita o necesita cancelar o volver a programar su cita, por favor pngase en contacto con [place]  Dept: [phone] y [place]. Las horas de oficina son de 8:00 a.m. a 4:30 p.m. de lunes a viernes. Por favor, tenga en cuenta que los mensajes de voz que se dejan despus de las 4:00 p.m. posiblemente no se devolvern hasta el siguiente da de [place].  Cerramos los fines de semana y [employer]. En todo momento tiene acceso a una enfermera para preguntas urgentes. Por favor, llame al nmero principal de la clnica Dept: [phone] y [place] instrucciones.   Para cualquier pregunta que no sea de carcter urgente, tambin puede ponerse en contacto con su proveedor [employer] Ahora ofrecemos visitas electrnicas para cualquier persona mayor de 18 aos que solicite atencin mdica en lnea para los sntomas que no sean urgentes. Para ms detalles vaya a [email or web address].   Tambin puede bajar la aplicacin de MyChart! Vaya a la tienda de aplicaciones, busque "MyChart", abra la  aplicacin, seleccione [place], e ingrese con su nombre de usuario y la contrasea de Pharmacist, community.  Las mscaras son opcionales en los centros de Hotel manager. Si desea que su equipo de cuidados mdicos use una [employer] atienden, por favor hgaselo saber al personal. Bethann Berkshire una persona de apoyo que tenga por lo menos 16 aos  para que le acompae a sus citas.

## 2021-11-13 ENCOUNTER — Other Ambulatory Visit: Payer: Self-pay

## 2021-11-13 ENCOUNTER — Inpatient Hospital Stay: Payer: No Typology Code available for payment source

## 2021-11-13 VITALS — BP 136/80 | HR 84 | Temp 98.6°F | Resp 18

## 2021-11-13 DIAGNOSIS — C50411 Malignant neoplasm of upper-outer quadrant of right female breast: Secondary | ICD-10-CM

## 2021-11-13 MED ORDER — PEGFILGRASTIM-CBQV 6 MG/0.6ML ~~LOC~~ SOSY
6.0000 mg | PREFILLED_SYRINGE | Freq: Once | SUBCUTANEOUS | Status: AC
  Administered 2021-11-13: 6 mg via SUBCUTANEOUS
  Filled 2021-11-13: qty 0.6

## 2021-11-18 ENCOUNTER — Ambulatory Visit (INDEPENDENT_AMBULATORY_CARE_PROVIDER_SITE_OTHER): Payer: Self-pay | Admitting: Physician Assistant

## 2021-11-18 DIAGNOSIS — C50411 Malignant neoplasm of upper-outer quadrant of right female breast: Secondary | ICD-10-CM

## 2021-11-18 DIAGNOSIS — M7989 Other specified soft tissue disorders: Secondary | ICD-10-CM

## 2021-11-18 NOTE — Progress Notes (Signed)
In person interpreter used for evaluation  [name] status post right mastectomy on 09/30/2021 with expander placement by Dr. Marla Roe.  She was last seen in the office on 11/04/2021.  Since her last office visit she has been doing well with no significant complaints or concerns.  She denies any pain, she denies any signs or symptoms of infection.  She notes no significant issues after her last expander fill.  She notes she did start her chemotherapy and has tolerated this well, again plan would be for approximately 4 months of chemotherapy.  At her last office visit she had 50 cc of injectable saline placed into her expander totaling 300/535cc.  Chaperone present.  On exam the right breast with expander in place, no overlying redness or signs of swelling.  Incisions are clean dry and intact with routine healing.  No warmth to touch, no palpable fluid collections, fluctuance, or areas of firmness.  Flaps are viable with no necrosis.   Injectable saline was placed into bilateral tissue expanders    Right: 50 cc for a total of 350/535 cc   The patient tolerated this well without any issues   Overall the patient is doing very well, she has tolerated the fills well and seems to be tolerating chemotherapy as well.  We will continue to provide fills, would like to see her back in 2 weeks.  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 She was given strict return precautions in the event she develops any new or worsening signs or symptoms.  She verbalized understanding and agreement to today's plan had no further questions or concerns.

## 2021-11-19 ENCOUNTER — Other Ambulatory Visit: Payer: Self-pay

## 2021-11-19 ENCOUNTER — Inpatient Hospital Stay: Payer: No Typology Code available for payment source

## 2021-11-19 ENCOUNTER — Inpatient Hospital Stay: Payer: No Typology Code available for payment source | Admitting: Adult Health

## 2021-11-21 ENCOUNTER — Inpatient Hospital Stay: Payer: No Typology Code available for payment source

## 2021-11-22 MED FILL — Fosaprepitant Dimeglumine For IV Infusion 150 MG (Base Eq): INTRAVENOUS | Qty: 5 | Status: AC

## 2021-11-22 MED FILL — Dexamethasone Sodium Phosphate Inj 100 MG/10ML: INTRAMUSCULAR | Qty: 1 | Status: AC

## 2021-11-25 ENCOUNTER — Encounter: Payer: Self-pay | Admitting: Hematology and Oncology

## 2021-11-25 ENCOUNTER — Encounter: Payer: Self-pay | Admitting: *Deleted

## 2021-11-25 ENCOUNTER — Inpatient Hospital Stay (HOSPITAL_BASED_OUTPATIENT_CLINIC_OR_DEPARTMENT_OTHER): Payer: No Typology Code available for payment source | Admitting: Hematology and Oncology

## 2021-11-25 ENCOUNTER — Other Ambulatory Visit: Payer: Self-pay

## 2021-11-25 ENCOUNTER — Inpatient Hospital Stay: Payer: No Typology Code available for payment source | Attending: Hematology and Oncology

## 2021-11-25 ENCOUNTER — Inpatient Hospital Stay: Payer: No Typology Code available for payment source

## 2021-11-25 VITALS — BP 130/74 | HR 87 | Temp 97.9°F | Wt 148.6 lb

## 2021-11-25 VITALS — BP 116/59 | HR 75

## 2021-11-25 DIAGNOSIS — C50411 Malignant neoplasm of upper-outer quadrant of right female breast: Secondary | ICD-10-CM

## 2021-11-25 DIAGNOSIS — Z5111 Encounter for antineoplastic chemotherapy: Secondary | ICD-10-CM | POA: Insufficient documentation

## 2021-11-25 DIAGNOSIS — Z5189 Encounter for other specified aftercare: Secondary | ICD-10-CM | POA: Insufficient documentation

## 2021-11-25 DIAGNOSIS — C50412 Malignant neoplasm of upper-outer quadrant of left female breast: Secondary | ICD-10-CM | POA: Insufficient documentation

## 2021-11-25 DIAGNOSIS — Z170 Estrogen receptor positive status [ER+]: Secondary | ICD-10-CM | POA: Insufficient documentation

## 2021-11-25 DIAGNOSIS — Z7952 Long term (current) use of systemic steroids: Secondary | ICD-10-CM | POA: Insufficient documentation

## 2021-11-25 DIAGNOSIS — Z79899 Other long term (current) drug therapy: Secondary | ICD-10-CM | POA: Insufficient documentation

## 2021-11-25 DIAGNOSIS — Z79632 Long term (current) use of antitumor antibiotic: Secondary | ICD-10-CM | POA: Insufficient documentation

## 2021-11-25 DIAGNOSIS — Z79630 Long term (current) use of alkylating agent: Secondary | ICD-10-CM | POA: Insufficient documentation

## 2021-11-25 LAB — CBC WITH DIFFERENTIAL (CANCER CENTER ONLY)
Abs Immature Granulocytes: 1.91 10*3/uL — ABNORMAL HIGH (ref 0.00–0.07)
Basophils Absolute: 0.1 10*3/uL (ref 0.0–0.1)
Basophils Relative: 1 %
Eosinophils Absolute: 0 10*3/uL (ref 0.0–0.5)
Eosinophils Relative: 0 %
HCT: 37.1 % (ref 36.0–46.0)
Hemoglobin: 12.1 g/dL (ref 12.0–15.0)
Immature Granulocytes: 19 %
Lymphocytes Relative: 14 %
Lymphs Abs: 1.4 10*3/uL (ref 0.7–4.0)
MCH: 28.3 pg (ref 26.0–34.0)
MCHC: 32.6 g/dL (ref 30.0–36.0)
MCV: 86.7 fL (ref 80.0–100.0)
Monocytes Absolute: 0.7 10*3/uL (ref 0.1–1.0)
Monocytes Relative: 7 %
Neutro Abs: 5.7 10*3/uL (ref 1.7–7.7)
Neutrophils Relative %: 59 %
Platelet Count: 179 10*3/uL (ref 150–400)
RBC: 4.28 MIL/uL (ref 3.87–5.11)
RDW: 11.9 % (ref 11.5–15.5)
WBC Count: 9.8 10*3/uL (ref 4.0–10.5)
nRBC: 0 % (ref 0.0–0.2)

## 2021-11-25 LAB — COMPREHENSIVE METABOLIC PANEL WITH GFR
ALT: 13 U/L (ref 0–44)
AST: 13 U/L — ABNORMAL LOW (ref 15–41)
Albumin: 3.9 g/dL (ref 3.5–5.0)
Alkaline Phosphatase: 80 U/L (ref 38–126)
Anion gap: 5 (ref 5–15)
BUN: 12 mg/dL (ref 6–20)
CO2: 26 mmol/L (ref 22–32)
Calcium: 8.8 mg/dL — ABNORMAL LOW (ref 8.9–10.3)
Chloride: 107 mmol/L (ref 98–111)
Creatinine, Ser: 0.57 mg/dL (ref 0.44–1.00)
GFR, Estimated: >60 mL/min
Glucose, Bld: 106 mg/dL — ABNORMAL HIGH (ref 70–99)
Potassium: 4.1 mmol/L (ref 3.5–5.1)
Sodium: 138 mmol/L (ref 135–145)
Total Bilirubin: 0.2 mg/dL — ABNORMAL LOW (ref 0.3–1.2)
Total Protein: 6.4 g/dL — ABNORMAL LOW (ref 6.5–8.1)

## 2021-11-25 MED ORDER — SODIUM CHLORIDE 0.9 % IV SOLN: Freq: Once | INTRAVENOUS | Status: AC

## 2021-11-25 MED ORDER — SODIUM CHLORIDE 0.9 % IV SOLN
10.0000 mg | Freq: Once | INTRAVENOUS | Status: AC
  Administered 2021-11-25: 10 mg via INTRAVENOUS
  Filled 2021-11-25: qty 10

## 2021-11-25 MED ORDER — HEPARIN SOD (PORK) LOCK FLUSH 100 UNIT/ML IV SOLN
500.0000 [IU] | Freq: Once | INTRAVENOUS | Status: AC | PRN
  Administered 2021-11-25: 500 [IU]

## 2021-11-25 MED ORDER — SODIUM CHLORIDE 0.9% FLUSH
10.0000 mL | INTRAVENOUS | Status: DC | PRN
  Administered 2021-11-25: 10 mL

## 2021-11-25 MED ORDER — SODIUM CHLORIDE 0.9 % IV SOLN
600.0000 mg/m2 | Freq: Once | INTRAVENOUS | Status: AC
  Administered 2021-11-25: 1060 mg via INTRAVENOUS
  Filled 2021-11-25: qty 53

## 2021-11-25 MED ORDER — DOXORUBICIN HCL CHEMO IV INJECTION 2 MG/ML
60.0000 mg/m2 | Freq: Once | INTRAVENOUS | Status: AC
  Administered 2021-11-25: 106 mg via INTRAVENOUS
  Filled 2021-11-25: qty 53

## 2021-11-25 MED ORDER — SODIUM CHLORIDE 0.9% FLUSH
10.0000 mL | INTRAVENOUS | Status: DC | PRN
  Administered 2021-11-25: 10 mL via INTRAVENOUS

## 2021-11-25 MED ORDER — PALONOSETRON HCL INJECTION 0.25 MG/5ML
0.2500 mg | Freq: Once | INTRAVENOUS | Status: AC
  Administered 2021-11-25: 0.25 mg via INTRAVENOUS
  Filled 2021-11-25: qty 5

## 2021-11-25 MED ORDER — SODIUM CHLORIDE 0.9 % IV SOLN
150.0000 mg | Freq: Once | INTRAVENOUS | Status: AC
  Administered 2021-11-25: 150 mg via INTRAVENOUS
  Filled 2021-11-25: qty 150

## 2021-11-25 NOTE — Patient Instructions (Signed)
Instrucciones al darle de alta: Discharge Instructions Gracias por elegir al [facility] [place] para brindarle atencin mdica de oncologa y hematologa.   Si usted tiene una cita de laboratorio con el [facility], por favor vaya directamente al [facility] y regstrese en el rea de registro.   Use ropa cmoda y adecuada para tener fcil acceso a las vas del Portacath (acceso venoso de larga duracin) o la lnea PICC (catter central colocado por va perifrica).   Nos esforzamos por ofrecerle tiempo de calidad con su proveedor. Es posible que tenga que volver a programar su cita si llega tarde (15 minutos o ms).  El llegar tarde le afecta a usted y a otros pacientes cuyas citas son posteriores a la suya.  Adems, si usted falta a tres o ms citas sin avisar a la oficina, puede ser retirado(a) de la clnica a discrecin del proveedor.      Para las solicitudes de renovacin de recetas, pida a su farmacia que se ponga en contacto con nuestra oficina y deje que transcurran 72 horas para que se complete el proceso de las renovaciones.    Hoy usted recibi los siguientes agentes de quimioterapia e/o inmunoterapia: doxorubicin, cyclophosphamide      Para ayudar a prevenir las nuseas y los vmitos despus de su tratamiento, le recomendamos que tome su medicamento para las nuseas segn las indicaciones.  LOS SNTOMAS QUE DEBEN COMUNICARSE INMEDIATAMENTE SE INDICAN A CONTINUACIN: *FIEBRE SUPERIOR A 100.4 F (38 C) O MS *ESCALOFROS O SUDORACIN *NUSEAS Y VMITOS QUE NO SE CONTROLAN CON EL MEDICAMENTO PARA LAS NUSEAS *DIFICULTAD INUSUAL PARA RESPIRAR  *MORETONES O HEMORRAGIAS NO HABITUALES *PROBLEMAS URINARIOS (dolor o ardor al orinar o frecuencia para orinar) *PROBLEMAS INTESTINALES (diarrea inusual, estreimiento, dolor cerca del ano) SENSIBILIDAD EN LA BOCA Y EN LA GARGANTA CON O SIN LA PRESENCIA DE LCERAS (dolor de garganta, llagas en la boca o dolor de  muelas/dientes) ERUPCIN, HINCHAZN O DOLORES INUSUALES FLUJO VAGINAL INUSUAL O PICAZN/RASQUIA    Los puntos marcados con un asterisco ( *) indican una posible emergencia y debe hacer un seguimiento tan pronto como le sea posible o vaya al Departamento de Emergencias si se le presenta algn problema.  Por favor, muestre la [place] DE ADVERTENCIA DE QUIMIOTERAPIA O LA TARJETA DE ADVERTENCIA DE INMUNOTERAPIA al registrarse en el Departamento de Emergencias y a la enfermera de triaje.  Si tiene preguntas despus de su visita o necesita cancelar o volver a programar su cita, por favor pngase en contacto con [facility]  Dept: [phone] y siga las instrucciones. Las horas de oficina son de 8:00 a.m. a 4:30 p.m. de lunes a viernes. Por favor, tenga en cuenta que los mensajes de voz que se dejan despus de las 4:00 p.m. posiblemente no se devolvern hasta el siguiente da de trabajo.  Cerramos los fines de semana y los das festivos importantes. En todo momento tiene acceso a una enfermera para preguntas urgentes. Por favor, llame al nmero principal de la clnica Dept: [phone] y siga las instrucciones.   Para cualquier pregunta que no sea de carcter urgente, tambin puede ponerse en contacto con su proveedor utilizando MyChart. Ahora ofrecemos visitas electrnicas para cualquier persona mayor de 18 aos que solicite atencin mdica en lnea para los sntomas que no sean urgentes. Para ms detalles vaya a [email or web address].   Tambin puede bajar la aplicacin de MyChart! Vaya a la tienda de aplicaciones, busque "MyChart", abra la aplicacin,   seleccione [place], e ingrese con su nombre de usuario y la contrasea de Pharmacist, community.  Las mscaras son opcionales en los centros de Hotel manager. Si desea que su equipo de cuidados mdicos use una [employer] atienden, por favor hgaselo saber al personal. Bethann Berkshire una persona de apoyo que tenga por lo menos 16 aos  para que le acompae a sus citas.

## 2021-11-25 NOTE — Progress Notes (Signed)
[facility] NOTE  Patient Care Team: Pcp, No as PCP - General [place]well Germany, RN as Oncology Nurse Navigator Mauro Kaufmann, RN as Oncology Nurse Navigator [name], MD as Consulting Physician (Hematology and Oncology) Jovita Kussmaul, MD as Consulting Physician (General Surgery) Kyung Rudd, MD as Consulting Physician (Radiation Oncology)  CHIEF COMPLAINTS/PURPOSE OF CONSULTATION:  Newly diagnosed breast cancer  HISTORY OF PRESENTING ILLNESS:  Hannah Murray 46 y.o. female is here because of recent diagnosis of right breast cancer  I reviewed her records extensively and collaborated the history with the patient.  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 Indeterminate mass in the right breast at 12:30, which may represent a fibroadenoma. No evidence of right axillary adenopathy.   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  Report  dates are Jul 10, 2021 and Jul 18, 2021.   The STAT Breast cancer panel offered by [facility] includes sequencing and rearrangement analysis for the following 9 genes:  ATM, BRCA1, BRCA2, CDH1, CHEK2, PALB2, PTEN, STK11 and TP53.   The Common Hereditary Cancers + RNA Panel offered by [facility] includes sequencing, deletion/duplication, and RNA testing of the following 47 genes: APC, ATM, AXIN2, BARD1, BMPR1A, BRCA1, BRCA2, BRIP1, CDH1, CDK4*, CDKN2A (p14ARF)*, CDKN2A (p16INK4a)*, CHEK2, CTNNA1, DICER1, EPCAM (Deletion/duplication testing only), GREM1 (promoter region deletion/duplication testing only), KIT, MEN1, MLH1, MSH2, MSH3, MSH6, MUTYH, NBN, NF1, NHTL1, PALB2, PDGFRA*, PMS2, POLD1, POLE, PTEN, RAD50, RAD51C, RAD51D, SDHB, SDHC, SDHD, SMAD4, SMARCA4. STK11, TP53, TSC1, TSC2, and VHL.  The following genes were evaluated for sequence changes only: SDHA and HOXB13 c.251G>A variant only.  RNA analysis is not performed for the * genes.     09/30/2021 Pathology Results   Pathology from the right breast showed invasive ductal carcinoma grade 3 out of 3 4.3 cm in greatest dimension, margins negative grade 3 of 3 solid type DCIS as well.  Right axillary lymph node evaluation showed 1 out of 8 lymph nodes positive for malignancy., final pathologic staging T2N1A.    11/11/2021 -  Chemotherapy   Patient is on Treatment Plan : BREAST ADJUVANT DOSE DENSE AC q14d / PACLitaxel q7d     Breast cancer ([place])  09/30/2021 Initial Diagnosis   Breast cancer ([place])   09/30/2021 Pathology Results   Pathology from the right breast showed invasive ductal carcinoma grade 3 out of 3 4.3 cm in greatest dimension, margins negative grade 3 of 3 solid type DCIS as well.  Right axillary lymph node evaluation showed 1 out of 8  lymph nodes positive for malignancy., final pathologic staging T2N1A.     A certified Patent attorney used for the conversation.   She is here to meet before planned C2 of chemotherapy. She has been fatigued for  3 days after first cycle of chemotherapy. Mild nausea, no vomiting. She had to use compazine BID for three days after chemotherapy. She is done with reconstruction. Rest of the pertinent 10 point ROS reviewed and negative  SURGICAL HISTORY: Past Surgical History:  Procedure Laterality Date   BREAST RECONSTRUCTION WITH PLACEMENT OF TISSUE EXPANDER AND FLEX HD (ACELLULAR HYDRATED DERMIS) Right 09/30/2021   Procedure: BREAST RECONSTRUCTION WITH PLACEMENT OF TISSUE EXPANDER AND FLEX HD (ACELLULAR HYDRATED DERMIS);  Surgeon: Wallace Going, DO;  Location: [facility];  Service: Plastics;  Laterality: Right;   MASTECTOMY W/ SENTINEL NODE BIOPSY Right 09/30/2021   Procedure: RIGHT MASTECTOMY WITH SENTINEL LYMPH NODE BIOPSY;  Surgeon: Jovita Kussmaul, MD;  Location: [facility];  Service: General;  Laterality: Right;   PORTACATH PLACEMENT Left 11/08/2021   Procedure: INSERTION PORT-A-CATH;  Surgeon: Jovita Kussmaul, MD;  Location: [facility];  Service: General;  Laterality: Left;    SOCIAL HISTORY: Social History   Socioeconomic History   Marital status: Married    Spouse name: Not on file   Number of children: 1   Years of education: Not on file   Highest education level: High school graduate  Occupational History   Not on file  Tobacco Use   Smoking status: Never   Smokeless tobacco: Never  Vaping Use   Vaping Use: Never used  Substance and Sexual Activity   Alcohol use: Yes    Comment: occasionally   Drug use: Never   Sexual activity: Not Currently  Other Topics Concern   Not on file  Social History Narrative   Not on file   Social Determinants of Health   Financial Resource Strain: Not on file  Food Insecurity: No Food Insecurity (04/30/2021)   Hunger Vital Sign    Worried About Running Out of Food in the Last Year: Never true    Ran Out of Food in the Last Year: Never true  Transportation Needs: No Transportation Needs (04/30/2021)    PRAPARE - Hydrologist (Medical): No    Lack of Transportation (Non-Medical): No  Physical Activity: Not on file  Stress: Not on file  Social Connections: Not on file  Intimate Partner Violence: Not At Risk (07/02/2021)   Humiliation, Afraid, Rape, and Kick questionnaire    Fear of Current or Ex-Partner: No    Emotionally Abused: No    Physically Abused: No    Sexually Abused: No    FAMILY HISTORY: Family History  Problem Relation Age of Onset   Hypertension Father    Diabetes Father    Thyroid disease Father    Ovarian cancer Other        MGM's niece; d. > 32    ALLERGIES:  has No Known Allergies.  MEDICATIONS:  Current Outpatient Medications  Medication Sig Dispense Refill   dexamethasone (DECADRON) 4 MG tablet Take 2 tablets (8 mg total) by mouth daily for 3 days. Start the day after doxorubicin/cyclophosphamide chemotherapy. Take with food. 30 tablet 1   lidocaine-prilocaine (EMLA) cream Apply to affected area once 30 g 3   ondansetron (ZOFRAN) 8 MG tablet Take 1 tab (8 mg) by mouth every 8 hrs as needed for nausea/vomiting. Start third day  after doxorubicin/cyclophosphamide chemotherapy. 30 tablet 1   oxyCODONE (ROXICODONE) 5 MG immediate release tablet Take 1 tablet (5 mg total) by mouth every 6 (six) hours as needed for severe pain. 10 tablet 0   prochlorperazine (COMPAZINE) 10 MG tablet Take 1 tablet (10 mg total) by mouth every 6 (six) hours as needed for nausea or vomiting. 30 tablet 1   No current facility-administered medications for this visit.   Facility-Administered Medications Ordered in Other Visits  Medication Dose Route Frequency Provider Last Rate Last Admin   cyclophosphamide (CYTOXAN) 1,060 mg in sodium chloride 0.9 % 250 mL chemo infusion  600 mg/m2 (Treatment Plan Recorded) Intravenous Once Laronica Bhagat, Arletha Pili, MD       DOXOrubicin (ADRIAMYCIN) chemo injection 106 mg  60 mg/m2 (Treatment Plan Recorded) Intravenous Once Loann Chahal,  Arletha Pili, MD       heparin lock flush 100 unit/mL  500 Units Intracatheter Once PRN Devanshi Califf, Arletha Pili, MD       palonosetron (ALOXI) injection 0.25 mg  0.25 mg Intravenous Once Teneisha Gignac, MD       sodium chloride flush (NS) 0.9 % injection 10 mL  10 mL Intracatheter PRN Chasyn Cinque, MD        REVIEW OF SYSTEMS:   Constitutional: Denies fevers, chills or abnormal night sweats Eyes: Denies blurriness of vision, double vision or watery eyes Ears, nose, mouth, throat, and face: Denies mucositis or sore throat Respiratory: Denies cough, dyspnea or wheezes Cardiovascular: Denies palpitation, chest discomfort or lower extremity swelling Gastrointestinal:  Denies nausea, heartburn or change in bowel habits Skin: Denies abnormal skin rashes Lymphatics: Denies new lymphadenopathy or easy bruising Neurological:Denies numbness, tingling or new weaknesses Behavioral/Psych: Mood is stable, no new changes  Breast: Denies any palpable lumps or discharge All other systems were reviewed with the patient and are negative.  PHYSICAL EXAMINATION: ECOG PERFORMANCE STATUS: 0 - Asymptomatic  Vitals:   11/25/21 0953  BP: 130/74  Pulse: 87  Temp: 97.9 F (36.6 C)  SpO2: 100%   Filed Weights   11/25/21 0953  Weight: 148 lb 9.6 oz (67.4 kg)    Physical Exam Constitutional:      Appearance: Normal appearance.  Cardiovascular:     Rate and Rhythm: Normal rate and regular rhythm.  Abdominal:     General: Abdomen is flat. There is no distension.     Tenderness: There is no abdominal tenderness.  Musculoskeletal:        General: No swelling. Normal range of motion.     Cervical back: Normal range of motion and neck supple. No rigidity.  Lymphadenopathy:     Cervical: No cervical adenopathy.  Skin:    General: Skin is warm and dry.  Neurological:     Mental Status: She is alert.      LABORATORY DATA:  I have reviewed the data as listed Lab Results  Component Value Date   WBC 9.8  11/25/2021   HGB 12.1 11/25/2021   HCT 37.1 11/25/2021   MCV 86.7 11/25/2021   PLT 179 11/25/2021   Lab Results  Component Value Date   NA 138 11/25/2021   K 4.1 11/25/2021   CL 107 11/25/2021   CO2 26 11/25/2021    RADIOGRAPHIC STUDIES: I have personally reviewed the radiological reports and agreed with the findings in the report.  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  Since last visit she had mastectomy which showed grade 3 invasive ductal carcinoma measuring 4.3 cm in greatest dimension, margins negative along with solid type DCIS high-grade.  Right axillary lymph node evaluation showed 1 out of 8 lymph nodes positive for malignancy.  Oncotype test resulted at 24, benefit of chemotherapy cannot be excluded. During her last visit, I recommended, given young age, high-grade, Oncotype of 24 and positive lymph node involvement.  She is agreeable to proceeding with chemotherapy.  We have discussed about her dose dense AC followed by T.  She is doing really well after her last chemotherapy.  No adverse effects reported except for 3 days of fatigue and mild nausea.  Physical examination today without any concerns.  Okay to proceed with chemotherapy if labs are all within parameters.  Return to clinic before her next chemotherapy.   Total time spent: 20 minutes including history, review of records, counseling and coordination of care All questions were answered. The patient knows to call the clinic with any problems, questions or concerns.    [name], MD 11/25/21

## 2021-11-25 NOTE — Assessment & Plan Note (Signed)
This is a 46 year old female patient with newly diagnosed right breast IDC, ER/PR positive, HER2 negative, clinically stage T2 N0 M0 grade 2/3 referred to medical oncology for recommendations.  Since last visit she had mastectomy which showed grade 3 invasive ductal carcinoma measuring 4.3 cm in greatest dimension, margins negative along with solid type DCIS high-grade.  Right axillary lymph node evaluation showed 1 out of 8 lymph nodes positive for malignancy.  Oncotype test resulted at 24, benefit of chemotherapy cannot be excluded. During her last visit, I recommended, given young age, high-grade, Oncotype of 24 and positive lymph node involvement.  She is agreeable to proceeding with chemotherapy.  We have discussed about her dose dense AC followed by T.  She is doing really well after her last chemotherapy.  No adverse effects reported except for 3 days of fatigue and mild nausea.  Physical examination today without any concerns.  Okay to proceed with chemotherapy if labs are all within parameters.  Return to clinic before her next chemotherapy.

## 2021-11-26 ENCOUNTER — Other Ambulatory Visit: Payer: Self-pay

## 2021-11-27 ENCOUNTER — Inpatient Hospital Stay: Payer: No Typology Code available for payment source

## 2021-11-27 VITALS — BP 112/78 | HR 86 | Temp 98.0°F | Resp 18

## 2021-11-27 DIAGNOSIS — C50411 Malignant neoplasm of upper-outer quadrant of right female breast: Secondary | ICD-10-CM

## 2021-11-27 MED ORDER — PEGFILGRASTIM-CBQV 6 MG/0.6ML ~~LOC~~ SOSY
6.0000 mg | PREFILLED_SYRINGE | Freq: Once | SUBCUTANEOUS | Status: AC
  Administered 2021-11-27: 6 mg via SUBCUTANEOUS
  Filled 2021-11-27: qty 0.6

## 2021-11-28 ENCOUNTER — Other Ambulatory Visit: Payer: Self-pay

## 2021-11-29 ENCOUNTER — Ambulatory Visit (HOSPITAL_COMMUNITY)
Admission: RE | Admit: 2021-11-29 | Discharge: 2021-11-29 | Disposition: A | Discharge status: Home / Self Care | Payer: No Typology Code available for payment source | Source: Ambulatory Visit | Attending: Physician Assistant | Admitting: Physician Assistant

## 2021-11-29 DIAGNOSIS — M7989 Other specified soft tissue disorders: Secondary | ICD-10-CM | POA: Insufficient documentation

## 2021-12-02 ENCOUNTER — Ambulatory Visit (INDEPENDENT_AMBULATORY_CARE_PROVIDER_SITE_OTHER): Payer: Self-pay | Admitting: Physician Assistant

## 2021-12-02 ENCOUNTER — Encounter: Payer: Self-pay | Admitting: Physician Assistant

## 2021-12-02 DIAGNOSIS — C50411 Malignant neoplasm of upper-outer quadrant of right female breast: Secondary | ICD-10-CM

## 2021-12-02 NOTE — Progress Notes (Signed)
In person interpreter used for evaluation  [name] status post right mastectomy on 09/30/2021 with expander placement by Dr. Marla Roe.  She was last seen in the office on 11/18/2021  Since her last office visit she has been doing well with no significant complaints or concerns.  She notes that after her last expander fill she had no issues other than minor tightness that day.  She denies any infectious symptoms including fever chills, redness or swelling.  She notes she is currently on chemotherapy and has been tolerating it well, again the plan is for approximately 4 months of chemotherapy.  At her last office visit she had 50 cc of injectable saline placed in the expander totaling 350/535 cc.  Chaperone present.  On exam the right breast with expander in place, no overlying redness or signs of swelling.  Incisions are clean dry and intact with routine healing.  No warmth to touch, no palpable fluid collections, fluctuance or areas of firmness.  Bilateral flaps are viable with no necrosis.  Injectable saline was placed into the right tissue expander  Right: 50 cc for a total of 400/535 cc  The patient tolerated it well with no issues  Overall the patient is doing very well, she is close to her final size, I would like to see her back in the clinic in two weeks for a repeat evaluation to check size. She is actively receiving chemotherapy and we will need to wait for her to complete this before we exchanged the expander for an implant.  The patient was given strict return precautions, she verbalized understanding and agreement to today's plan had no further questions or concerns.

## 2021-12-06 MED FILL — Fosaprepitant Dimeglumine For IV Infusion 150 MG (Base Eq): INTRAVENOUS | Qty: 5 | Status: AC

## 2021-12-06 MED FILL — Dexamethasone Sodium Phosphate Inj 100 MG/10ML: INTRAMUSCULAR | Qty: 1 | Status: AC

## 2021-12-09 ENCOUNTER — Inpatient Hospital Stay: Payer: No Typology Code available for payment source

## 2021-12-09 ENCOUNTER — Inpatient Hospital Stay (HOSPITAL_BASED_OUTPATIENT_CLINIC_OR_DEPARTMENT_OTHER): Payer: Self-pay | Admitting: Adult Health

## 2021-12-09 ENCOUNTER — Other Ambulatory Visit: Payer: Self-pay

## 2021-12-09 ENCOUNTER — Encounter: Payer: Self-pay | Admitting: Adult Health

## 2021-12-09 VITALS — BP 114/70 | HR 82 | Temp 98.2°F | Resp 16

## 2021-12-09 DIAGNOSIS — C50411 Malignant neoplasm of upper-outer quadrant of right female breast: Secondary | ICD-10-CM

## 2021-12-09 LAB — CBC WITH DIFFERENTIAL (CANCER CENTER ONLY)
Abs Immature Granulocytes: 2.47 10*3/uL — ABNORMAL HIGH (ref 0.00–0.07)
Basophils Absolute: 0.1 10*3/uL (ref 0.0–0.1)
Basophils Relative: 1 %
Eosinophils Absolute: 0 10*3/uL (ref 0.0–0.5)
Eosinophils Relative: 0 %
HCT: 35.1 % — ABNORMAL LOW (ref 36.0–46.0)
Hemoglobin: 11.5 g/dL — ABNORMAL LOW (ref 12.0–15.0)
Immature Granulocytes: 21 %
Lymphocytes Relative: 10 %
Lymphs Abs: 1.2 10*3/uL (ref 0.7–4.0)
MCH: 28.4 pg (ref 26.0–34.0)
MCHC: 32.8 g/dL (ref 30.0–36.0)
MCV: 86.7 fL (ref 80.0–100.0)
Monocytes Absolute: 0.8 10*3/uL (ref 0.1–1.0)
Monocytes Relative: 7 %
Neutro Abs: 7.4 10*3/uL (ref 1.7–7.7)
Neutrophils Relative %: 61 %
Platelet Count: 170 10*3/uL (ref 150–400)
RBC: 4.05 MIL/uL (ref 3.87–5.11)
RDW: 12.2 % (ref 11.5–15.5)
Smear Review: NORMAL
WBC Count: 12 10*3/uL — ABNORMAL HIGH (ref 4.0–10.5)
nRBC: 0.2 % (ref 0.0–0.2)

## 2021-12-09 LAB — CMP (CANCER CENTER ONLY)
ALT: 9 U/L (ref 0–44)
AST: 12 U/L — ABNORMAL LOW (ref 15–41)
Albumin: 4 g/dL (ref 3.5–5.0)
Alkaline Phosphatase: 76 U/L (ref 38–126)
Anion gap: 5 (ref 5–15)
BUN: 13 mg/dL (ref 6–20)
CO2: 26 mmol/L (ref 22–32)
Calcium: 8.6 mg/dL — ABNORMAL LOW (ref 8.9–10.3)
Chloride: 107 mmol/L (ref 98–111)
Creatinine: 0.59 mg/dL (ref 0.44–1.00)
GFR, Estimated: >60 mL/min (ref >60)
Glucose, Bld: 98 mg/dL (ref 70–99)
Potassium: 3.9 mmol/L (ref 3.5–5.1)
Sodium: 138 mmol/L (ref 135–145)
Total Bilirubin: 0.2 mg/dL — ABNORMAL LOW (ref 0.3–1.2)
Total Protein: 6.7 g/dL (ref 6.5–8.1)

## 2021-12-09 MED ORDER — DOXORUBICIN HCL CHEMO IV INJECTION 2 MG/ML
60.0000 mg/m2 | Freq: Once | INTRAVENOUS | Status: AC
  Administered 2021-12-09: 106 mg via INTRAVENOUS
  Filled 2021-12-09: qty 53

## 2021-12-09 MED ORDER — SODIUM CHLORIDE 0.9 % IV SOLN
150.0000 mg | Freq: Once | INTRAVENOUS | Status: AC
  Administered 2021-12-09: 150 mg via INTRAVENOUS
  Filled 2021-12-09: qty 150

## 2021-12-09 MED ORDER — SODIUM CHLORIDE 0.9 % IV SOLN
10.0000 mg | Freq: Once | INTRAVENOUS | Status: AC
  Administered 2021-12-09: 10 mg via INTRAVENOUS
  Filled 2021-12-09: qty 10

## 2021-12-09 MED ORDER — SODIUM CHLORIDE 0.9 % IV SOLN: Freq: Once | INTRAVENOUS | Status: AC

## 2021-12-09 MED ORDER — HEPARIN SOD (PORK) LOCK FLUSH 100 UNIT/ML IV SOLN
500.0000 [IU] | Freq: Once | INTRAVENOUS | Status: AC | PRN
  Administered 2021-12-09: 500 [IU]

## 2021-12-09 MED ORDER — SODIUM CHLORIDE 0.9 % IV SOLN
600.0000 mg/m2 | Freq: Once | INTRAVENOUS | Status: AC
  Administered 2021-12-09: 1060 mg via INTRAVENOUS
  Filled 2021-12-09: qty 53

## 2021-12-09 MED ORDER — SODIUM CHLORIDE 0.9% FLUSH
10.0000 mL | INTRAVENOUS | Status: DC | PRN
  Administered 2021-12-09: 10 mL

## 2021-12-09 MED ORDER — SODIUM CHLORIDE 0.9% FLUSH
10.0000 mL | INTRAVENOUS | Status: AC | PRN
  Administered 2021-12-09: 10 mL

## 2021-12-09 MED ORDER — PALONOSETRON HCL INJECTION 0.25 MG/5ML
0.2500 mg | Freq: Once | INTRAVENOUS | Status: AC
  Administered 2021-12-09: 0.25 mg via INTRAVENOUS
  Filled 2021-12-09: qty 5

## 2021-12-09 NOTE — Progress Notes (Signed)
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 Indeterminate mass in the right breast at 12:30, which may represent a fibroadenoma. No evidence of right axillary adenopathy.   05/16/2021 Pathology Results   Right breast 10 0 clock mass biopsy showed invasive mammary carcinoma, ductal phenotype prognostics include ER 95% strong staining intensity PR 95% strong staining intensity Ki-67 of 40% and HER2 negative   06/11/2021 Cancer Staging   Staging form: Breast, AJCC 8th Edition - Pathologic: Stage IIA (pT2, pN1a, cM0, G3, ER+, PR+, HER2-, Oncotype DX score: 24) - Signed by [name], MD on 11/04/2021 Multigene prognostic tests performed: Oncotype DX Recurrence score range: Greater than or equal to 11 Histologic grading system: 3 grade system   07/10/2021 Genetic Testing   Negative hereditary cancer genetic testing: no pathogenic variants detected [facility] Breast STAT Panel or Common Hereditary Cancers +RNA Panel.  Report dates are Jul 10, 2021 and Jul 18, 2021.   The STAT Breast cancer panel offered by [facility] includes sequencing and rearrangement analysis for the following 9 genes:  ATM, BRCA1, BRCA2, CDH1, CHEK2, PALB2, PTEN, STK11 and TP53.   The Common Hereditary Cancers + RNA Panel  offered by [facility] includes sequencing, deletion/duplication, and RNA testing of the following 47 genes: APC, ATM, AXIN2, BARD1, BMPR1A, BRCA1, BRCA2, BRIP1, CDH1, CDK4*, CDKN2A (p14ARF)*, CDKN2A (p16INK4a)*, CHEK2, CTNNA1, DICER1, EPCAM (Deletion/duplication testing only), GREM1 (promoter region deletion/duplication testing only), KIT, MEN1, MLH1, MSH2, MSH3, MSH6, MUTYH, NBN, NF1, NHTL1, PALB2, PDGFRA*, PMS2, POLD1, POLE, PTEN, RAD50, RAD51C, RAD51D, SDHB, SDHC, SDHD, SMAD4, SMARCA4. STK11, TP53, TSC1, TSC2, and VHL.  The following genes were evaluated for sequence changes only: SDHA and HOXB13 c.251G>A variant only.  RNA analysis is not performed for the * genes.     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  Right axillary lymph node evaluation showed 1 out of 8 lymph nodes positive for malignancy., final pathologic staging T2N1A.    11/11/2021 -  Chemotherapy   Patient is on Treatment Plan : BREAST ADJUVANT DOSE DENSE AC q14d / PACLitaxel q7d       CURRENT THERAPY: Adriamycin and Cytoxan  INTERVAL HISTORY: Trilby Dorrough 46 y.o. female returns for follow-up and evaluation prior to receiving adjuvant chemotherapy with Adriamycin and Cytoxan.  He is receiving this on day 1 of a 14-day cycle with growth factor of Neulasta or biosimilar given on day 3.  Today is cycle 3-day 1 of therapy.  She tells me that she is tolerating treatment quite well.  She has no nausea vomiting diarrhea constipation significant pain mouth sores or any other concerns today.   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  MEDICAL HISTORY: Past Medical History:  Diagnosis Date   Medical  history non-contributory     SURGICAL HISTORY: Past Surgical History:  Procedure Laterality Date   BREAST RECONSTRUCTION WITH PLACEMENT OF TISSUE EXPANDER AND FLEX HD (ACELLULAR HYDRATED DERMIS) Right 09/30/2021   Procedure: BREAST RECONSTRUCTION WITH PLACEMENT OF TISSUE EXPANDER AND FLEX HD (ACELLULAR HYDRATED DERMIS);  Surgeon: Wallace Going, DO;  Location: [facility];  Service: Plastics;  Laterality: Right;   MASTECTOMY W/ SENTINEL NODE BIOPSY Right 09/30/2021   Procedure: RIGHT MASTECTOMY WITH SENTINEL LYMPH NODE BIOPSY;  Surgeon: Jovita Kussmaul, MD;  Location: [facility];  Service: General;  Laterality: Right;   PORTACATH PLACEMENT Left 11/08/2021   Procedure: INSERTION PORT-A-CATH;  Surgeon: Jovita Kussmaul, MD;  Location: [facility];  Service: General;  Laterality: Left;    SOCIAL HISTORY: Social History   Socioeconomic History   Marital status: Married    Spouse name: Not on file   Number of children: 1   Years of education: Not on file   Highest education level: High school graduate  Occupational History   Not on file  Tobacco Use   Smoking status: Never   Smokeless tobacco: Never  Vaping Use   Vaping Use: Never used  Substance and Sexual Activity   Alcohol use: Yes    Comment: occasionally   Drug use: Never   Sexual activity: Not Currently  Other Topics Concern   Not on file  Social History Narrative   Not on file   Social Determinants of Health   Financial Resource Strain: Not on file  Food Insecurity: No Food Insecurity (04/30/2021)   Hunger Vital Sign    Worried About Running Out of Food in the Last Year: Never true    Ran Out of Food in the Last Year: Never true  Transportation Needs: No Transportation Needs (04/30/2021)   PRAPARE - Hydrologist (Medical): No    Lack of Transportation (Non-Medical): No  Physical Activity: Not on file  Stress: Not on file  Social Connections:  Not on file  Intimate Partner Violence: Not At Risk (07/02/2021)   Humiliation, Afraid, Rape, and Kick questionnaire    Fear of Current or Ex-Partner: No    Emotionally Abused: No    Physically Abused: No    Sexually Abused: No    FAMILY HISTORY: Family History  Problem Relation Age of Onset   Hypertension Father    Diabetes Father    Thyroid disease Father    Ovarian cancer Other        MGM's niece; d. > 61    Review of Systems  Constitutional:  Negative for appetite change, chills, fatigue, fever and unexpected weight change.  HENT:   Negative for hearing loss, lump/mass and trouble swallowing.   Eyes:  Negative for eye problems and icterus.  Respiratory:  Negative for chest tightness, cough and shortness of breath.   Cardiovascular:  Negative for chest pain, leg swelling and palpitations.  Gastrointestinal:  Negative for abdominal distention, abdominal pain, constipation, diarrhea, nausea and vomiting.  Endocrine: Negative for hot flashes.  Genitourinary:  Negative for difficulty urinating.   Musculoskeletal:  Negative for arthralgias.  Skin:  Negative for itching and rash.  Neurological:  Negative for  dizziness, extremity weakness, headaches and numbness.  Hematological:  Negative for adenopathy. Does not bruise/bleed easily.  Psychiatric/Behavioral:  Negative for depression. The patient is not nervous/anxious.       PHYSICAL EXAMINATION  ECOG PERFORMANCE STATUS: 0 - Asymptomatic  Vitals:   12/09/21 1109  BP: (!) 120/57  Pulse: 80  Resp: 16  Temp: 97.9 F (36.6 C)  SpO2: 100%    Physical Exam Constitutional:      General: She is not in acute distress.    Appearance: Normal appearance. She is not toxic-appearing.  HENT:     Head: Normocephalic and atraumatic.  Eyes:     General: No scleral icterus. Cardiovascular:     Rate and Rhythm: Normal rate and regular rhythm.     Pulses: Normal pulses.     Heart sounds: Normal heart sounds.  Pulmonary:      Effort: Pulmonary effort is normal.     Breath sounds: Normal breath sounds.  Abdominal:     General: Abdomen is flat. Bowel sounds are normal. There is no distension.     Palpations: Abdomen is soft.     Tenderness: There is no abdominal tenderness.  Musculoskeletal:        General: No swelling.     Cervical back: Neck supple.  Lymphadenopathy:     Cervical: No cervical adenopathy.  Skin:    General: Skin is warm and dry.     Findings: No rash.  Neurological:     General: No focal deficit present.     Mental Status: She is alert.  Psychiatric:        Mood and Affect: Mood normal.        Behavior: Behavior normal.     LABORATORY DATA:  CBC    Component Value Date/Time   WBC 12.0 (H) 12/09/2021 1052   RBC 4.05 12/09/2021 1052   HGB 11.5 (L) 12/09/2021 1052   HCT 35.1 (L) 12/09/2021 1052   PLT 170 12/09/2021 1052   MCV 86.7 12/09/2021 1052   MCH 28.4 12/09/2021 1052   MCHC 32.8 12/09/2021 1052   RDW 12.2 12/09/2021 1052   LYMPHSABS 1.2 12/09/2021 1052   MONOABS 0.8 12/09/2021 1052   EOSABS 0.0 12/09/2021 1052   BASOSABS 0.1 12/09/2021 1052    CMP     Component Value Date/Time   NA 138 12/09/2021 1052   K 3.9 12/09/2021 1052   CL 107 12/09/2021 1052   CO2 26 12/09/2021 1052   GLUCOSE 98 12/09/2021 1052   BUN 13 12/09/2021 1052   CREATININE 0.59 12/09/2021 1052   CALCIUM 8.6 (L) 12/09/2021 1052   PROT 6.7 12/09/2021 1052   ALBUMIN 4.0 12/09/2021 1052   AST 12 (L) 12/09/2021 1052   ALT 9 12/09/2021 1052   ALKPHOS 76 12/09/2021 1052   BILITOT 0.2 (L) 12/09/2021 1052   GFRNONAA >60 12/09/2021 1052       ASSESSMENT and THERAPY PLAN:   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  She is here for cycle 3 of Adriamycin and Cytoxan.  She is tolerating this well.  Her  labs are stable and within parameters for treatment.  She will proceed with treatment today.  We discussed healthy diet and exercise which she will continue to do.  She will return in 2 days for her growth factor injection and then in  2 weeks for labs, follow-up, and her final Adriamycin and Cytoxan.  After she completes this initial chemotherapy she will proceed with weekly Taxol.  She will return as noted above but knows to call for any questions or concerns in the meantime because we can always seeing her sooner if needed.  This visit was completed with Lavon Paganini present, our [place] interpreter.     All questions were answered. The patient knows to call the clinic with any problems, questions or concerns. We can certainly see the patient much sooner if necessary.  Total encounter time:20 minutes*in face-to-face visit time, chart review, lab review, care coordination, order entry, and documentation of the encounter time.    Wilber Bihari, NP 12/09/21 12:10 PM Medical Oncology and Hematology [facility] [place], [place] [address] Tel. [phone]    Fax. [phone]  *Total Encounter Time as defined by the Centers for Medicare and Medicaid Services includes, in addition to the face-to-face time of a patient visit (documented in the note above) non-face-to-face time: obtaining and reviewing outside history, ordering and reviewing medications, tests or procedures, care coordination (communications with other health care professionals or caregivers) and documentation in the medical record.

## 2021-12-09 NOTE — Assessment & Plan Note (Signed)
Patient is a 46 year old Spanish-speaking woman with stage IIa ER/PR positive breast cancer status post right mastectomy, now continuing on adjuvant chemotherapy.  She is here for cycle 3 of Adriamycin and Cytoxan.  She is tolerating this well.  Her labs are stable and within parameters for treatment.  She will proceed with treatment today.  We discussed healthy diet and exercise which she will continue to do.  She will return in 2 days for her growth factor injection and then in 2 weeks for labs, follow-up, and her final Adriamycin and Cytoxan.  After she completes this initial chemotherapy she will proceed with weekly Taxol.  She will return as noted above but knows to call for any questions or concerns in the meantime because we can always seeing her sooner if needed.  This visit was completed with Lavon Paganini present, our [place] interpreter.

## 2021-12-09 NOTE — Progress Notes (Signed)
Interpreter Almyra Free) in room with patient to translate.

## 2021-12-09 NOTE — Patient Instructions (Signed)
Instrucciones al darle de alta: Discharge Instructions Gracias por elegir al [facility] [place] para brindarle atencin mdica de oncologa y hematologa.   Si usted tiene una cita de laboratorio con el [facility], por favor vaya directamente al [facility] y regstrese en el rea de registro.   Use ropa cmoda y adecuada para tener fcil acceso a las vas del Portacath (acceso venoso de larga duracin) o la lnea PICC (catter central colocado por va perifrica).   Nos esforzamos por ofrecerle tiempo de calidad con su proveedor. Es posible que tenga que volver a programar su cita si llega tarde (15 minutos o ms).  El llegar tarde le afecta a usted y a otros pacientes cuyas citas son posteriores a la suya.  Adems, si usted falta a tres o ms citas sin avisar a la oficina, puede ser retirado(a) de la clnica a discrecin del proveedor.      Para las solicitudes de renovacin de recetas, pida a su farmacia que se ponga en contacto con nuestra oficina y deje que transcurran 72 horas para que se complete el proceso de las renovaciones.    Hoy usted recibi los siguientes agentes de quimioterapia e/o inmunoterapia: doxorubicin, cyclophosphamide      Para ayudar a prevenir las nuseas y los vmitos despus de su tratamiento, le recomendamos que tome su medicamento para las nuseas segn las indicaciones.  LOS SNTOMAS QUE DEBEN COMUNICARSE INMEDIATAMENTE SE INDICAN A CONTINUACIN: *FIEBRE SUPERIOR A 100.4 F (38 C) O MS *ESCALOFROS O SUDORACIN *NUSEAS Y VMITOS QUE NO SE CONTROLAN CON EL MEDICAMENTO PARA LAS NUSEAS *DIFICULTAD INUSUAL PARA RESPIRAR  *MORETONES O HEMORRAGIAS NO HABITUALES *PROBLEMAS URINARIOS (dolor o ardor al orinar o frecuencia para orinar) *PROBLEMAS INTESTINALES (diarrea inusual, estreimiento, dolor cerca del ano) SENSIBILIDAD EN LA BOCA Y EN LA GARGANTA CON O SIN LA PRESENCIA DE LCERAS (dolor de garganta, llagas en la boca o dolor de  muelas/dientes) ERUPCIN, HINCHAZN O DOLORES INUSUALES FLUJO VAGINAL INUSUAL O PICAZN/RASQUIA    Los puntos marcados con un asterisco ( *) indican una posible emergencia y debe hacer un seguimiento tan pronto como le sea posible o vaya al Departamento de Emergencias si se le presenta algn problema.  Por favor, muestre la TARJETA DE ADVERTENCIA DE QUIMIOTERAPIA O LA TARJETA DE ADVERTENCIA DE INMUNOTERAPIA al registrarse en el Departamento de Emergencias y a la enfermera de triaje.  Si tiene preguntas despus de su visita o necesita cancelar o volver a programar su cita, por favor pngase en contacto con [facility]  Dept: [phone] y siga las instrucciones. Las horas de oficina son de 8:00 a.m. a 4:30 p.m. de lunes a viernes. Por favor, tenga en cuenta que los mensajes de voz que se dejan despus de las 4:00 p.m. posiblemente no se devolvern hasta el siguiente da de trabajo.  Cerramos los fines de semana y los das festivos importantes. En todo momento tiene acceso a una enfermera para preguntas urgentes. Por favor, llame al nmero principal de la clnica Dept: [phone] y siga las instrucciones.   Para cualquier pregunta que no sea de carcter urgente, tambin puede ponerse en contacto con su proveedor utilizando MyChart. Ahora ofrecemos visitas electrnicas para cualquier persona mayor de 18 aos que solicite atencin mdica en lnea para los sntomas que no sean urgentes. Para ms detalles vaya a [email or web address].   Tambin puede bajar la aplicacin de MyChart! Vaya a la tienda de aplicaciones, busque "MyChart", abra la aplicacin,   seleccione [place], e ingrese con su nombre de usuario y la contrasea de Pharmacist, community.  Las mscaras son opcionales en los centros de Hotel manager. Si desea que su equipo de cuidados mdicos use una [employer] atienden, por favor hgaselo saber al personal. Hannah Murray una persona de apoyo que tenga por lo menos 16 aos  para que le acompae a sus citas.

## 2021-12-11 ENCOUNTER — Inpatient Hospital Stay: Payer: No Typology Code available for payment source

## 2021-12-11 VITALS — BP 120/63 | HR 86 | Temp 98.6°F | Resp 18

## 2021-12-11 DIAGNOSIS — C50411 Malignant neoplasm of upper-outer quadrant of right female breast: Secondary | ICD-10-CM

## 2021-12-11 MED ORDER — PEGFILGRASTIM-CBQV 6 MG/0.6ML ~~LOC~~ SOSY
6.0000 mg | PREFILLED_SYRINGE | Freq: Once | SUBCUTANEOUS | Status: AC
  Administered 2021-12-11: 6 mg via SUBCUTANEOUS
  Filled 2021-12-11: qty 0.6

## 2021-12-12 ENCOUNTER — Encounter: Payer: Self-pay | Admitting: *Deleted

## 2021-12-18 ENCOUNTER — Ambulatory Visit (INDEPENDENT_AMBULATORY_CARE_PROVIDER_SITE_OTHER): Payer: Self-pay | Admitting: Physician Assistant

## 2021-12-18 DIAGNOSIS — C50411 Malignant neoplasm of upper-outer quadrant of right female breast: Secondary | ICD-10-CM

## 2021-12-18 NOTE — Progress Notes (Signed)
In person interpreter used   [name] status post right mastectomy on 09/30/2021 with expander placement by Dr. Marla Roe.  She was last seen in the office on 11/18/2021.  Since her last office visit she has been doing well with no significant complaints or concerns.  She had no pain or issues after her last expander fill.  She denies any infectious symptoms.  She is currently on chemotherapy and will continue on for approximately 4 months in total duration ending likely late in January 2024.  Chaperone present.  On exam right breast with expander in place, no overlying redness or signs of swelling, infection.  Incision is clean dry and intact with routine healing.  No warmth to touch, no palpable fluid collections or areas of fluctuance or firmness.  Bilateral flaps are viable with no necrosis.  Photos taken today  At her last office visit she had a total of 400/535 cc.  No additional expander fill today.  Overall the patient is doing very well, she was preoperatively reportedly a size D and would like to maintain symmetry.  I do feel she is likely at or close to the right size and does not need an additional fill today.  I would like her to follow-up 1 more time in our office and have Dr. Marla Roe take a look to verify she is okay at this point.  We will have to wait for her to finish chemotherapy prior to implant exchange.  The patient was given strict return precautions, she was scheduled follow-up evaluation.  She verbalized understanding and agreement to today's plan.

## 2021-12-19 ENCOUNTER — Other Ambulatory Visit: Payer: Self-pay

## 2021-12-19 ENCOUNTER — Encounter (HOSPITAL_COMMUNITY): Payer: Self-pay | Admitting: Emergency Medicine

## 2021-12-19 ENCOUNTER — Telehealth: Payer: Self-pay | Admitting: Hematology and Oncology

## 2021-12-19 ENCOUNTER — Emergency Department (HOSPITAL_COMMUNITY): Payer: No Typology Code available for payment source

## 2021-12-19 ENCOUNTER — Inpatient Hospital Stay (HOSPITAL_COMMUNITY)
Admission: EM | Admit: 2021-12-19 | Discharge: 2021-12-20 | DRG: 603 | Disposition: A | Discharge status: Home / Self Care | Payer: No Typology Code available for payment source | Attending: Internal Medicine | Admitting: Internal Medicine

## 2021-12-19 DIAGNOSIS — L03119 Cellulitis of unspecified part of limb: Secondary | ICD-10-CM

## 2021-12-19 DIAGNOSIS — L700 Acne vulgaris: Secondary | ICD-10-CM | POA: Diagnosis present

## 2021-12-19 DIAGNOSIS — L03312 Cellulitis of back [any part except buttock]: Principal | ICD-10-CM | POA: Diagnosis present

## 2021-12-19 DIAGNOSIS — E876 Hypokalemia: Secondary | ICD-10-CM | POA: Diagnosis present

## 2021-12-19 DIAGNOSIS — D638 Anemia in other chronic diseases classified elsewhere: Secondary | ICD-10-CM | POA: Diagnosis present

## 2021-12-19 DIAGNOSIS — Z79899 Other long term (current) drug therapy: Secondary | ICD-10-CM

## 2021-12-19 DIAGNOSIS — R509 Fever, unspecified: Principal | ICD-10-CM

## 2021-12-19 DIAGNOSIS — C50911 Malignant neoplasm of unspecified site of right female breast: Secondary | ICD-10-CM | POA: Diagnosis present

## 2021-12-19 DIAGNOSIS — R21 Rash and other nonspecific skin eruption: Secondary | ICD-10-CM

## 2021-12-19 DIAGNOSIS — L0390 Cellulitis, unspecified: Secondary | ICD-10-CM | POA: Diagnosis present

## 2021-12-19 DIAGNOSIS — L299 Pruritus, unspecified: Secondary | ICD-10-CM | POA: Diagnosis present

## 2021-12-19 DIAGNOSIS — Z9011 Acquired absence of right breast and nipple: Secondary | ICD-10-CM

## 2021-12-19 LAB — CBC WITH DIFFERENTIAL/PLATELET
Abs Immature Granulocytes: 0.6 10*3/uL — ABNORMAL HIGH (ref 0.00–0.07)
Band Neutrophils: 11 %
Basophils Absolute: 0.1 10*3/uL (ref 0.0–0.1)
Basophils Relative: 1 %
Eosinophils Absolute: 0 10*3/uL (ref 0.0–0.5)
Eosinophils Relative: 0 %
HCT: 33.6 % — ABNORMAL LOW (ref 36.0–46.0)
Hemoglobin: 10.8 g/dL — ABNORMAL LOW (ref 12.0–15.0)
Lymphocytes Relative: 17 %
Lymphs Abs: 1.5 10*3/uL (ref 0.7–4.0)
MCH: 28.6 pg (ref 26.0–34.0)
MCHC: 32.1 g/dL (ref 30.0–36.0)
MCV: 88.9 fL (ref 80.0–100.0)
Monocytes Absolute: 1.8 10*3/uL — ABNORMAL HIGH (ref 0.1–1.0)
Monocytes Relative: 20 %
Myelocytes: 7 %
Neutro Abs: 4.9 10*3/uL (ref 1.7–7.7)
Neutrophils Relative %: 44 %
Platelets: 214 10*3/uL (ref 150–400)
RBC: 3.78 MIL/uL — ABNORMAL LOW (ref 3.87–5.11)
RDW: 12.2 % (ref 11.5–15.5)
WBC: 8.9 10*3/uL (ref 4.0–10.5)
nRBC: 0 % (ref 0.0–0.2)

## 2021-12-19 LAB — COMPREHENSIVE METABOLIC PANEL
ALT: 14 U/L (ref 0–44)
AST: 19 U/L (ref 15–41)
Albumin: 3.7 g/dL (ref 3.5–5.0)
Alkaline Phosphatase: 77 U/L (ref 38–126)
Anion gap: 6 (ref 5–15)
BUN: 9 mg/dL (ref 6–20)
CO2: 24 mmol/L (ref 22–32)
Calcium: 8.9 mg/dL (ref 8.9–10.3)
Chloride: 107 mmol/L (ref 98–111)
Creatinine, Ser: 0.64 mg/dL (ref 0.44–1.00)
GFR, Estimated: >60 mL/min (ref >60)
Glucose, Bld: 106 mg/dL — ABNORMAL HIGH (ref 70–99)
Potassium: 3.4 mmol/L — ABNORMAL LOW (ref 3.5–5.1)
Sodium: 137 mmol/L (ref 135–145)
Total Bilirubin: 0.4 mg/dL (ref 0.3–1.2)
Total Protein: 7 g/dL (ref 6.5–8.1)

## 2021-12-19 LAB — LACTIC ACID, PLASMA: Lactic Acid, Venous: 1.1 mmol/L (ref 0.5–1.9)

## 2021-12-19 LAB — I-STAT BETA HCG BLOOD, ED (MC, WL, AP ONLY): I-stat hCG, quantitative: <5 m[IU]/mL (ref <5)

## 2021-12-19 MED ORDER — ACETAMINOPHEN 325 MG PO TABS: 650.0000 mg | ORAL_TABLET | Freq: Four times a day (QID) | ORAL | Status: DC | PRN

## 2021-12-19 MED ORDER — PIPERACILLIN-TAZOBACTAM 3.375 G IVPB 30 MIN
3.3750 g | Freq: Once | INTRAVENOUS | Status: AC
  Administered 2021-12-19: 3.375 g via INTRAVENOUS
  Filled 2021-12-19: qty 50

## 2021-12-19 MED ORDER — VANCOMYCIN HCL 1500 MG/300ML IV SOLN: 1500.0000 mg | INTRAVENOUS | Status: DC

## 2021-12-19 MED ORDER — OXYCODONE HCL 5 MG PO TABS: 5.0000 mg | ORAL_TABLET | Freq: Four times a day (QID) | ORAL | Status: DC | PRN

## 2021-12-19 MED ORDER — POTASSIUM CHLORIDE 2 MEQ/ML IV SOLN
INTRAVENOUS | Status: DC
  Filled 2021-12-19: qty 1000

## 2021-12-19 MED ORDER — VANCOMYCIN HCL 750 MG/150ML IV SOLN
750.0000 mg | Freq: Two times a day (BID) | INTRAVENOUS | Status: DC
  Filled 2021-12-19: qty 150

## 2021-12-19 MED ORDER — MELATONIN 5 MG PO TABS: 5.0000 mg | ORAL_TABLET | Freq: Every evening | ORAL | Status: DC | PRN

## 2021-12-19 MED ORDER — VANCOMYCIN HCL 1500 MG/300ML IV SOLN
1500.0000 mg | Freq: Once | INTRAVENOUS | Status: AC
  Administered 2021-12-19: 1500 mg via INTRAVENOUS
  Filled 2021-12-19: qty 300

## 2021-12-19 MED ORDER — PROCHLORPERAZINE EDISYLATE 10 MG/2ML IJ SOLN: 5.0000 mg | Freq: Four times a day (QID) | INTRAMUSCULAR | Status: DC | PRN

## 2021-12-19 MED ORDER — HYDROMORPHONE HCL 1 MG/ML IJ SOLN: 0.5000 mg | INTRAMUSCULAR | Status: DC | PRN

## 2021-12-19 MED ORDER — SODIUM CHLORIDE 0.9 % IV BOLUS
1000.0000 mL | Freq: Once | INTRAVENOUS | Status: AC
  Administered 2021-12-19: 1000 mL via INTRAVENOUS

## 2021-12-19 MED ORDER — SODIUM CHLORIDE 0.9 % IV SOLN
2.0000 g | Freq: Three times a day (TID) | INTRAVENOUS | Status: DC
  Administered 2021-12-20: 2 g via INTRAVENOUS
  Filled 2021-12-19: qty 12.5

## 2021-12-19 MED ORDER — POLYETHYLENE GLYCOL 3350 17 G PO PACK: 17.0000 g | PACK | Freq: Every day | ORAL | Status: DC | PRN

## 2021-12-19 NOTE — H&P (Signed)
History and Physical  Hannah Murray NID:[id number] DOB: 1975-11-13 DOA: 12/19/2021  Referring physician: Dr. Wyvonnia Dusky, [place] PCP: Pcp, No  Outpatient Specialists: Medical oncology, Dr. Chryl Heck Patient coming from: Home  Chief Complaint: Right buttock rash   HPI: Hannah Murray is a 46 y.o. female with medical history significant for Right breast cancer, last chemotherapy on 12/09/21, has chemotherapy every 2 weeks, nexyt appointment with Dr. Chryl Heck is on 12/20/21, who presented to [facility] ED with complaints of a rash on her R buttock and back and a fever of 101 at home.  Associated with pruritus for which she self medicated with Benadryl.  She noticed the rash on Saturday, 4 days prior.  Her husband had cut down a poison IV oak tree on Friday.  They initially thought she might have been exposed to plant oil.  She was told by her pcp to come to the ED if she developed a fever, which she did.  In the ED, due to concern for sepsis in the setting of immunosuppression, cultures were obtained, she was started on broad spectrum IV antibiotics, and received IV fluid.  [facility], hospitalist service,  was asked to admit.  ED Course: Tmax 98.8.  Reported fever 101 at home.  BP 129/70, pulse 115, respiration rate 22.  O2 saturation 98% on room air.  Lab studies remarkable for hemoglobin 10.8 from baseline of 12.  Serum potassium 3.4.  Review of Systems: Review of systems as noted in the HPI. All other systems reviewed and are negative.   Past Medical History:  Diagnosis Date   Medical history non-contributory    Past Surgical History:  Procedure Laterality Date   BREAST RECONSTRUCTION WITH PLACEMENT OF TISSUE EXPANDER AND FLEX HD (ACELLULAR HYDRATED DERMIS) Right 09/30/2021   Procedure: BREAST RECONSTRUCTION WITH PLACEMENT OF TISSUE EXPANDER AND FLEX HD (ACELLULAR HYDRATED DERMIS);  Surgeon: Wallace Going, DO;  Location: [place];  Service: Plastics;  Laterality: Right;   MASTECTOMY W/  SENTINEL NODE BIOPSY Right 09/30/2021   Procedure: RIGHT MASTECTOMY WITH SENTINEL LYMPH NODE BIOPSY;  Surgeon: Jovita Kussmaul, MD;  Location: [place];  Service: General;  Laterality: Right;   PORTACATH PLACEMENT Left 11/08/2021   Procedure: INSERTION PORT-A-CATH;  Surgeon: Jovita Kussmaul, MD;  Location: [place];  Service: General;  Laterality: Left;    Social History:  reports that she has never smoked. She has never used smokeless tobacco. She reports current alcohol use. She reports that she does not use drugs.   No Known Allergies  Family History  Problem Relation Age of Onset   Hypertension Father    Diabetes Father    Thyroid disease Father    Ovarian cancer Other        MGM's niece; d. > 74      Prior to Admission medications   Medication Sig Start Date End Date Taking? Authorizing Provider  dexamethasone (DECADRON) 4 MG tablet Take 2 tablets (8 mg total) by mouth daily for 3 days. Start the day after doxorubicin/cyclophosphamide chemotherapy. Take with food. 11/04/21   [name], MD  lidocaine-prilocaine (EMLA) cream Apply to affected area once 11/04/21   [name], MD  ondansetron (ZOFRAN) 8 MG tablet Take 1 tab (8 mg) by mouth every 8 hrs as needed for nausea/vomiting. Start third day after doxorubicin/cyclophosphamide chemotherapy. 10/23/21   [name], MD  oxyCODONE (ROXICODONE) 5 MG immediate release tablet Take 1 tablet (5 mg total) by mouth every 6 (six) hours as needed  for severe pain. 11/08/21   Jovita Kussmaul, MD  prochlorperazine (COMPAZINE) 10 MG tablet Take 1 tablet (10 mg total) by mouth every 6 (six) hours as needed for nausea or vomiting. 11/04/21   [name], MD    Physical Exam: BP 129/70   Pulse 86   Temp 98.1 F (36.7 C) (Oral)   Resp (!) 22   Ht '5\' 5"'$  (1.651 m)   Wt 67 kg   LMP  (LMP Unknown)   SpO2 97%   BMI 24.58 kg/m   General: 46 y.o. year-old female well developed well nourished in no  acute distress.  Alert and oriented x3. Cardiovascular: Tachycardic with no rubs or gallops.  No thyromegaly or JVD noted.  No lower extremity edema. 2/4 pulses in all 4 extremities. Respiratory: Clear to auscultation with no wheezes or rales. Good inspiratory effort. Abdomen: Soft nontender nondistended with normal bowel sounds x4 quadrants. Muskuloskeletal: No cyanosis, clubbing or edema noted bilaterally Neuro: CN II-XII intact, strength, sensation, reflexes Skin:  Back   Psychiatry: Judgement and insight appear normal. Mood is appropriate for condition and setting      R buttock        Labs on Admission:  Basic Metabolic Panel: Recent Labs  Lab 12/19/21 2026  NA 137  K 3.4*  CL 107  CO2 24  GLUCOSE 106*  BUN 9  CREATININE 0.64  CALCIUM 8.9   Liver Function Tests: Recent Labs  Lab 12/19/21 2026  AST 19  ALT 14  ALKPHOS 77  BILITOT 0.4  PROT 7.0  ALBUMIN 3.7   No results for input(s): "LIPASE", "AMYLASE" in the last 168 hours. No results for input(s): "AMMONIA" in the last 168 hours. CBC: Recent Labs  Lab 12/19/21 2026  WBC 8.9  NEUTROABS 4.9  HGB 10.8*  HCT 33.6*  MCV 88.9  PLT 214   Cardiac Enzymes: No results for input(s): "CKTOTAL", "CKMB", "CKMBINDEX", "TROPONINI" in the last 168 hours.  BNP (last 3 results) No results for input(s): "BNP" in the last 8760 hours.  ProBNP (last 3 results) No results for input(s): "PROBNP" in the last 8760 hours.  CBG: No results for input(s): "GLUCAP" in the last 168 hours.  Radiological Exams on Admission: DG Chest Port 1 View  Result Date: 12/19/2021 CLINICAL DATA:  Question sepsis.  Cancer patient.  Fever EXAM: PORTABLE CHEST 1 VIEW COMPARISON:  Chest 11/08/2021 FINDINGS: Heart size and vascularity normal. Lungs clear without infiltrate or effusion Tissue expander right breast.  Port-A-Cath tip SVC unchanged. IMPRESSION: No active disease. Electronically Signed   By: Franchot Gallo M.D.   On: 12/19/2021  20:53    EKG: I independently viewed the EKG done and my findings are as followed: Sinus rhythm rate of 99.  Nonspecific ST-T changes.  QTc 438.  Assessment/Plan Present on Admission:  Cellulitis  Principal Problem:   Cellulitis  Right buttock rash, possible contact dermatitis from poison ivy, with concern for early cellulitis in the setting of immunosuppression. Possible poison ivy rash with possible exposure to the urushiol oil Treat cellulitis to avoid spread in immunosuppressive state. Planned chemo on 12/23/21. Follow cultures obtained in the ED  Sepsis secondary to cellulitis Reported subjective fevers at home with Tmax 101 Tachycardic in the ED pulse 115, tachypneic respiration rate 22 Concern for early right buttock cellulitis Follow cultures obtained in the ED Follow MRSA screening test Continue broad-spectrum IV antibiotics and de-escalate antibiotics as able In the ED, she was started on IV vancomycin and IV  Zosyn x1 dose. Continue IV vancomycin for now, switch IV Zosyn to cefepime. Gentle IV fluid hydration LR at 50 cc/h x 2 days  Hypokalemia Serum potassium 3.4 Repleted intravenously Repeat chemistry panel in the morning Obtain magnesium level in the morning.  Anemia of chronic disease with recent chemotherapy Hemoglobin 10.8 with baseline hemoglobin of 12.0 Recent chemotherapy on 12/09/2021  Right breast cancer status postchemotherapy every 2 weeks Last chemotherapy on 12/09/2021 Plan for chemotherapy on 12/23/2021 Had appointment with medical oncology on 12/20/2021 with Dr. Chryl Heck Dr. Chryl Heck added to treatment team Consult medical oncology in the morning.   DVT prophylaxis: Subcu Lovenox daily  Code Status: Full code  Family Communication: Updated husband at bedside.  Disposition Plan: Admitted to MedSurg unit  Consults called: None.  Admission status: Inpatient status.   Status is: Inpatient The patient will require at least 2 midnights for  further evaluation and treatment of present condition.   Kayleen Memos MD [facility] Pager [phone]  If 7PM-7AM, please contact night-coverage [email or web address] Password [email or web address]  12/19/2021, 11:30 PM

## 2021-12-19 NOTE — Progress Notes (Addendum)
Pharmacy Antibiotic Note  Hannah Murray is a 46 y.o. female admitted on 12/19/2021 with sepsis.  Pharmacy has been consulted for Cefepime and Vancomycin dosing.     Patient received Vancomycin '1500mg'$  IV x1 loading dose and Zosyn 3.375gm IV x 1 dose in the ED  Plan: Cefepime 2gm IV q8h Vancomycin 750 mg IV Q 12 hrs. Goal AUC 400-550.  Expected AUC: 444  SCr used: 0.8 (rounded up from 0.64) Follow renal function F/u culture results and sensitivities  Height: '5\' 5"'$  (165.1 cm) Weight: 67 kg (147 lb 11.3 oz) IBW/kg (Calculated) : 57  Temp (24hrs), Avg:98.5 F (36.9 C), Min:98.1 F (36.7 C), Max:98.8 F (37.1 C)  Recent Labs  Lab 12/19/21 2026  WBC 8.9  CREATININE 0.64  LATICACIDVEN 1.1    Estimated Creatinine Clearance: 79.1 mL/min (by C-G formula based on SCr of 0.64 mg/dL).    No Known Allergies  Antimicrobials this admission: 10/26 Zosyn x 1 10/27 Cefepime >>   10/27 Vancomycin >>  Dose adjustments this admission:    Microbiology results: 10/26 BCx:      Thank you for allowing pharmacy to be a part of this patient's care.  Everette Rank, PharmD 12/19/2021 11:32 PM

## 2021-12-19 NOTE — ED Provider Notes (Signed)
[facility] DEPT Provider Note   CSN: [id number] Arrival date & time: 12/19/21  1928     History  Chief Complaint  Patient presents with   Fever    Hannah Murray is a 46 y.o. female.  Level 5 caveat for language barrier.  Translator is used.  Complains of itchy rash to her low back, upper back on the right and upper back on the left, and right knee.  Denies any pain at these lesions just has itching for which she takes Benadryl.  She would initially thought this could be from poison ivy that may have been on her husband's clothing.  Denies any new exposures to foods, medications, lotions or detergents or cleaning products.  She does have a history of breast cancer and receiving chemotherapy last dose was about 10 days ago.  No recent changes in medications.  Today she noticed a fever up to 100 and became concerned. No one else with similar rash.  No travel. No headache.  No cough, congestion, chest pain, shortness of breath, abdominal pain, nausea or vomiting.  The history is provided by the patient. The history is limited by a language barrier and the condition of the patient.  Fever Associated symptoms: rash   Associated symptoms: no headaches, no nausea and no vomiting        Home Medications Prior to Admission medications   Medication Sig Start Date End Date Taking? Authorizing Provider  dexamethasone (DECADRON) 4 MG tablet Take 2 tablets (8 mg total) by mouth daily for 3 days. Start the day after doxorubicin/cyclophosphamide chemotherapy. Take with food. 11/04/21   [name], MD  lidocaine-prilocaine (EMLA) cream Apply to affected area once 11/04/21   [name], MD  ondansetron (ZOFRAN) 8 MG tablet Take 1 tab (8 mg) by mouth every 8 hrs as needed for nausea/vomiting. Start third day after doxorubicin/cyclophosphamide chemotherapy. 10/23/21   [name], MD  oxyCODONE (ROXICODONE) 5 MG immediate release tablet Take 1 tablet (5 mg  total) by mouth every 6 (six) hours as needed for severe pain. 11/08/21   Jovita Kussmaul, MD  prochlorperazine (COMPAZINE) 10 MG tablet Take 1 tablet (10 mg total) by mouth every 6 (six) hours as needed for nausea or vomiting. 11/04/21   [name], MD      Allergies    Patient has no known allergies.    Review of Systems   Review of Systems  Constitutional:  Positive for fever.  Gastrointestinal:  Negative for nausea and vomiting.  Skin:  Positive for rash and wound.  Neurological:  Negative for weakness and headaches.   all other systems are negative except as noted in the HPI and PMH.    Physical Exam Updated Vital Signs BP 120/69   Pulse 87   Temp 98.1 F (36.7 C) (Oral)   Resp 16   Ht '5\' 5"'$  (1.651 m)   Wt 67 kg   LMP  (LMP Unknown)   SpO2 98%   BMI 24.58 kg/m  Physical Exam Vitals and nursing note reviewed.  Constitutional:      General: She is not in acute distress.    Appearance: She is well-developed.  HENT:     Head: Normocephalic and atraumatic.     Mouth/Throat:     Pharynx: No oropharyngeal exudate.  Eyes:     Conjunctiva/sclera: Conjunctivae normal.     Pupils: Pupils are equal, round, and reactive to light.  Neck:     Comments: No meningismus. Cardiovascular:  Rate and Rhythm: Normal rate and regular rhythm.     Heart sounds: Normal heart sounds. No murmur heard. Pulmonary:     Effort: Pulmonary effort is normal. No respiratory distress.     Breath sounds: Normal breath sounds.  Abdominal:     Palpations: Abdomen is soft.     Tenderness: There is no abdominal tenderness. There is no guarding or rebound.  Musculoskeletal:        General: No tenderness. Normal range of motion.     Cervical back: Normal range of motion and neck supple.  Skin:    General: Skin is warm.     Findings: Rash present.     Comments: Blistering rash to right lower buttock, right low back, right knee, left back and left shoulder as depicted.  Mild surrounding  erythema.  No fluctuance.  Neurological:     Mental Status: She is alert and oriented to person, place, and time.     Cranial Nerves: No cranial nerve deficit.     Motor: No abnormal muscle tone.     Coordination: Coordination normal.     Comments:  5/5 strength throughout. CN 2-12 intact.Equal grip strength.   Psychiatric:        Behavior: Behavior normal.          ED Results / Procedures / Treatments   Labs (all labs ordered are listed, but only abnormal results are displayed) Labs Reviewed  COMPREHENSIVE METABOLIC PANEL - Abnormal; Notable for the following components:      Result Value   Potassium 3.4 (*)    Glucose, Bld 106 (*)    All other components within normal limits  CBC WITH DIFFERENTIAL/PLATELET - Abnormal; Notable for the following components:   RBC 3.78 (*)    Hemoglobin 10.8 (*)    HCT 33.6 (*)    Monocytes Absolute 1.8 (*)    Abs Immature Granulocytes 0.60 (*)    All other components within normal limits  CULTURE, BLOOD (ROUTINE X 2)  CULTURE, BLOOD (ROUTINE X 2)  URINE CULTURE  MRSA NEXT GEN BY PCR, NASAL  LACTIC ACID, PLASMA  URINALYSIS, ROUTINE W REFLEX MICROSCOPIC  PROTIME-INR  APTT  CBC WITH DIFFERENTIAL/PLATELET  COMPREHENSIVE METABOLIC PANEL  MAGNESIUM  PHOSPHORUS  HIV ANTIBODY (ROUTINE TESTING W REFLEX)  I-STAT BETA HCG BLOOD, ED ([facility], [facility], [facility] ONLY)    EKG None  Radiology DG Chest Port 1 View  Result Date: 12/19/2021 CLINICAL DATA:  Question sepsis.  Cancer patient.  Fever EXAM: PORTABLE CHEST 1 VIEW COMPARISON:  Chest 11/08/2021 FINDINGS: Heart size and vascularity normal. Lungs clear without infiltrate or effusion Tissue expander right breast.  Port-A-Cath tip SVC unchanged. IMPRESSION: No active disease. Electronically Signed   By: Franchot Gallo M.D.   On: 12/19/2021 20:53    Procedures Procedures    Medications Ordered in ED Medications  piperacillin-tazobactam (ZOSYN) IVPB 3.375 g (3.375 g Intravenous New Bag/Given 12/19/21  2214)  vancomycin (VANCOREADY) IVPB 1500 mg/300 mL (has no administration in time range)  sodium chloride 0.9 % bolus 1,000 mL (1,000 mLs Intravenous New Bag/Given 12/19/21 2214)    ED Course/ Medical Decision Making/ A&P                           Medical Decision Making Risk Prescription drug management. Decision regarding hospitalization.   Immunocompromised patient here with rash and fever. Afebrile on arrival. Stable vitals. Not toxic or septic appearing.   Rash similar to  zoster but in multiple locations and not dermatomal. Has pustules and vesicles with surrounding erythema.  Fever to 101 today.   Labs and cultures obtained. No neutropenia or thrombocytopenia. Lactate normal. CXR clear.   Unclear etiology of rash. Concern for possible occult infection, chemo side effect, disseminated zoster?  Plan admission for antibiotics while cultures pending overnight. Likely evaluate by oncology and ID in morning.  Not toxic or septic.   D.w Dr. [place] Crane.         Final Clinical Impression(s) / ED Diagnoses Final diagnoses:  Febrile illness  Rash    Rx / DC Orders ED Discharge Orders     None         Ashritha Desrosiers, Annie Main, MD 12/20/21 [phone]

## 2021-12-19 NOTE — ED Triage Notes (Signed)
Pt c/o fever of 100 at home today. She also has several sores on her face, leg, and back, unsure if pt was bit by something. Pt is a cancer pt

## 2021-12-19 NOTE — ED Provider Triage Note (Signed)
Emergency Medicine Provider Triage Evaluation Note  Hannah Murray , a 46 y.o. female  was evaluated in triage.  Pt complains of rash and fever. Patient actively receiving chemo for breast cancer, states that Sunday she noticed these lesions on her back and right leg that are itchy but not painful. Family in the room state he works for [employer] and assumed that she had handled his clothes and that had caused the rash. States that today she felt warm and saw her temperature was 100. She took benadryl for itching but no tylenol or ibuprofen. Denies cough, congestion, chest pain, shortness of breath.  Review of Systems  Positive:  Negative:   Physical Exam  BP 132/81   Pulse (!) 115   Temp 98.8 F (37.1 C) (Oral)   Resp 17   Ht '5\' 5"'$  (1.651 m)   Wt 67 kg   SpO2 95%   BMI 24.58 kg/m  Gen:   Awake, no distress   Resp:  Normal effort  MSK:   Moves extremities without difficulty  Other:  Pustule-like lesions with underlying erythema present to the right low back and right leg as well as the mid back and left shoulder. See images for further       Medical Decision Making  Medically screening exam initiated at 8:16 PM.  Appropriate orders placed.  Briani Maul was informed that the remainder of the evaluation will be completed by another provider, this initial triage assessment does not replace that evaluation, and the importance of remaining in the ED until their evaluation is complete.     Bud Face, PA-C 12/19/21 2020

## 2021-12-19 NOTE — Progress Notes (Signed)
A consult was received from an ED physician for Vancomycin per pharmacy dosing.  The patient's profile has been reviewed for ht/wt/allergies/indication/available labs.    A one time order has been placed for Vancomycin '1500mg'$  IV.    Further antibiotics/pharmacy consults should be ordered by admitting physician if indicated.                       Thank you, Everette Rank, PharmD 12/19/2021  10:01 PM

## 2021-12-19 NOTE — Telephone Encounter (Signed)
Contacted patient to scheduled appointments. Patient is aware of appointments that are scheduled.   

## 2021-12-19 NOTE — Progress Notes (Deleted)
Symptom Management Consult note [place]    Patient Care Team: Pcp, No as PCP - General Rockwell Germany, RN as Oncology Nurse Navigator Mauro Kaufmann, RN as Oncology Nurse Navigator [name], MD as Consulting Physician (Hematology and Oncology) Jovita Kussmaul, MD as Consulting Physician (General Surgery) Kyung Rudd, MD as Consulting Physician (Radiation Oncology)    Name of the patient: Hannah Murray  [id number]  [birth year]   Date of visit: 12/20/2021   Chief Complaint/Reason for visit: rash   Current Therapy: AC with Udyenca  Last treatment:  Day 1   Cycle 3 on 12/09/21   ASSESSMENT & PLAN: Patient is a 46 y.o. 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 Chryl Heck.  I have viewed most recent oncology note and lab work.    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 Indeterminate mass in the right breast at 12:30, which may represent a fibroadenoma. No evidence of right axillary adenopathy.   05/16/2021 Pathology Results   Right breast 10 0 clock mass biopsy showed invasive mammary carcinoma, ductal phenotype prognostics include ER 95% strong staining intensity PR 95% strong staining intensity Ki-67 of 40% and HER2 negative   06/11/2021 Cancer Staging   Staging form: Breast, AJCC 8th Edition - Pathologic: Stage IIA (pT2, pN1a, cM0, G3, ER+, PR+, HER2-, Oncotype DX score: 24) - Signed by [name], MD on 11/04/2021 Multigene prognostic tests performed: Oncotype DX Recurrence score range: Greater  than or equal to 11 Histologic grading system: 3 grade system   07/10/2021 Genetic Testing   Negative hereditary cancer genetic testing: no pathogenic variants detected [facility] Breast STAT Panel or Common Hereditary Cancers +RNA Panel.  Report dates are Jul 10, 2021 and Jul 18, 2021.   The STAT Breast cancer panel offered by [facility] includes sequencing and rearrangement analysis for the following 9 genes:  ATM, BRCA1, BRCA2, CDH1, CHEK2, PALB2, PTEN, STK11 and TP53.   The Common Hereditary Cancers + RNA Panel offered by [facility] includes sequencing, deletion/duplication, and RNA testing of the following 47 genes: APC, ATM, AXIN2, BARD1, BMPR1A, BRCA1, BRCA2, BRIP1, CDH1, CDK4*, CDKN2A (p14ARF)*, CDKN2A (p16INK4a)*, CHEK2, CTNNA1, DICER1, EPCAM (Deletion/duplication testing only), GREM1 (promoter region deletion/duplication testing only), KIT, MEN1, MLH1, MSH2, MSH3, MSH6, MUTYH, NBN, NF1, NHTL1, PALB2, PDGFRA*, PMS2, POLD1, POLE, PTEN, RAD50, RAD51C, RAD51D, SDHB, SDHC, SDHD, SMAD4, SMARCA4. STK11, TP53, TSC1, TSC2, and VHL.  The following genes were evaluated for sequence changes only: SDHA and HOXB13 c.251G>A variant only.  RNA analysis is not performed for the * genes.     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  Right axillary lymph node evaluation showed 1 out of 8 lymph nodes positive for malignancy., final pathologic staging T2N1A.    11/11/2021 -  Chemotherapy   Patient is on Treatment Plan : BREAST ADJUVANT DOSE DENSE AC  q14d / PACLitaxel q7d         Interval history-: Hannah Murray is a 46 y.o. female with oncologic history as above presenting [facility] today with chief complaint of      ROS  All other systems are reviewed and are negative for acute change except as noted in the HPI.    No Known  Allergies   Past Medical History:  Diagnosis Date   Medical history non-contributory      Past Surgical History:  Procedure Laterality Date   BREAST RECONSTRUCTION WITH PLACEMENT OF TISSUE EXPANDER AND FLEX HD (ACELLULAR HYDRATED DERMIS) Right 09/30/2021   Procedure: BREAST RECONSTRUCTION WITH PLACEMENT OF TISSUE EXPANDER AND FLEX HD (ACELLULAR HYDRATED DERMIS);  Surgeon: Wallace Going, DO;  Location: [place];  Service: Plastics;  Laterality: Right;   MASTECTOMY W/ SENTINEL NODE BIOPSY Right 09/30/2021   Procedure: RIGHT MASTECTOMY WITH SENTINEL LYMPH NODE BIOPSY;  Surgeon: Jovita Kussmaul, MD;  Location: [place];  Service: General;  Laterality: Right;   PORTACATH PLACEMENT Left 11/08/2021   Procedure: INSERTION PORT-A-CATH;  Surgeon: Jovita Kussmaul, MD;  Location: [place];  Service: General;  Laterality: Left;    Social History   Socioeconomic History   Marital status: Married    Spouse name: Not on file   Number of children: 1   Years of education: Not on file   Highest education level: High school graduate  Occupational History   Not on file  Tobacco Use   Smoking status: Never   Smokeless tobacco: Never  Vaping Use   Vaping Use: Never used  Substance and Sexual Activity   Alcohol use: Yes    Comment: occasionally   Drug use: Never   Sexual activity: Not Currently  Other Topics Concern   Not on file  Social History Narrative   Not on file   Social Determinants of Health   Financial Resource Strain: Not on file  Food Insecurity: No Food Insecurity (04/30/2021)   Hunger Vital Sign    Worried About Running Out of Food in the Last Year: Never true    Ran Out of Food in the Last Year: Never true  Transportation Needs: No Transportation Needs (04/30/2021)   PRAPARE - Hydrologist (Medical): No    Lack of Transportation (Non-Medical): No  Physical Activity: Not on file  Stress: Not  on file  Social Connections: Not on file  Intimate Partner Violence: Not At Risk (07/02/2021)   Humiliation, Afraid, Rape, and Kick questionnaire    Fear of Current or Ex-Partner: No    Emotionally Abused: No    Physically Abused: No    Sexually Abused: No    Family History  Problem Relation Age of Onset   Hypertension Father    Diabetes Father    Thyroid disease Father    Ovarian cancer Other        MGM's niece; d. > 50     Current Outpatient Medications:    dexamethasone (DECADRON) 4 MG tablet, Take 2 tablets (8 mg total) by mouth daily for 3 days. Start the day after doxorubicin/cyclophosphamide chemotherapy. Take with food., Disp: 30 tablet, Rfl: 1   lidocaine-prilocaine (EMLA) cream, Apply to affected area once, Disp: 30 g, Rfl: 3   ondansetron (ZOFRAN) 8 MG tablet, Take 1 tab (8 mg) by mouth every 8 hrs as needed for nausea/vomiting. Start third day after doxorubicin/cyclophosphamide chemotherapy., Disp: 30 tablet, Rfl: 1  oxyCODONE (ROXICODONE) 5 MG immediate release tablet, Take 1 tablet (5 mg total) by mouth every 6 (six) hours as needed for severe pain., Disp: 10 tablet, Rfl: 0   prochlorperazine (COMPAZINE) 10 MG tablet, Take 1 tablet (10 mg total) by mouth every 6 (six) hours as needed for nausea or vomiting., Disp: 30 tablet, Rfl: 1  PHYSICAL EXAM: ECOG FS:{CHL ONC SW:[id number]}   There were no vitals filed for this visit. Physical Exam     LABORATORY DATA: I have reviewed the data as listed    Latest Ref Rng & Units 12/09/2021   10:52 AM 11/25/2021    9:32 AM 11/11/2021    7:57 AM  CBC  WBC 4.0 - 10.5 K/uL 12.0  9.8  6.8   Hemoglobin 12.0 - 15.0 g/dL 11.5  12.1  13.6   Hematocrit 36.0 - 46.0 % 35.1  37.1  41.5   Platelets 150 - 400 K/uL 170  179  236         Latest Ref Rng & Units 12/09/2021   10:52 AM 11/25/2021    9:32 AM 11/11/2021    7:57 AM  CMP  Glucose 70 - 99 mg/dL 98  106  89   BUN 6 - 20 mg/dL 13  12  12    Creatinine 0.44 - 1.00 mg/dL 0.59   0.57  0.63   Sodium 135 - 145 mmol/L 138  138  139   Potassium 3.5 - 5.1 mmol/L 3.9  4.1  4.0   Chloride 98 - 111 mmol/L 107  107  107   CO2 22 - 32 mmol/L 26  26  26    Calcium 8.9 - 10.3 mg/dL 8.6  8.8  9.3   Total Protein 6.5 - 8.1 g/dL 6.7  6.4  7.2   Total Bilirubin 0.3 - 1.2 mg/dL 0.2  0.2  0.4   Alkaline Phos 38 - 126 U/L 76  80  40   AST 15 - 41 U/L 12  13  17    ALT 0 - 44 U/L 9  13  10         RADIOGRAPHIC STUDIES (from last 24 hours if applicable) I have personally reviewed the radiological images as listed and agreed with the findings in the report. No results found.      Visit Diagnosis: 1. 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   All questions were answered. The patient knows to call the clinic with any problems, questions or concerns. No barriers to learning was detected.  I have spent a total of *** minutes minutes of face-to-face and non-face-to-face time, preparing to see the patient, obtaining and/or reviewing separately obtained history, performing a medically appropriate examination, counseling and educating the patient, ordering tests, documenting clinical information in the electronic health record, and care coordination (communications with other health care professionals or caregivers).    Thank you for allowing me to participate in the care of this patient.    Barrie Folk, PA-C Department of Hematology/Oncology [facility] Phone: [phone]  [phone]    12/19/2021 5:17 PM

## 2021-12-20 ENCOUNTER — Inpatient Hospital Stay: Payer: No Typology Code available for payment source | Admitting: Physician Assistant

## 2021-12-20 DIAGNOSIS — C50411 Malignant neoplasm of upper-outer quadrant of right female breast: Secondary | ICD-10-CM

## 2021-12-20 LAB — URINALYSIS, ROUTINE W REFLEX MICROSCOPIC
Bacteria, UA: NONE SEEN
Bilirubin Urine: NEGATIVE
Glucose, UA: NEGATIVE mg/dL
Ketones, ur: NEGATIVE mg/dL
Leukocytes,Ua: NEGATIVE
Nitrite: NEGATIVE
Protein, ur: NEGATIVE mg/dL
Specific Gravity, Urine: 1.006 (ref 1.005–1.030)
pH: 6 (ref 5.0–8.0)

## 2021-12-20 LAB — CBC WITH DIFFERENTIAL/PLATELET
Abs Immature Granulocytes: 1 10*3/uL — ABNORMAL HIGH (ref 0.00–0.07)
Band Neutrophils: 7 %
Basophils Absolute: 0 10*3/uL (ref 0.0–0.1)
Basophils Relative: 0 %
Eosinophils Absolute: 0 10*3/uL (ref 0.0–0.5)
Eosinophils Relative: 0 %
HCT: 34.9 % — ABNORMAL LOW (ref 36.0–46.0)
Hemoglobin: 11.3 g/dL — ABNORMAL LOW (ref 12.0–15.0)
Lymphocytes Relative: 11 %
Lymphs Abs: 1 10*3/uL (ref 0.7–4.0)
MCH: 28.5 pg (ref 26.0–34.0)
MCHC: 32.4 g/dL (ref 30.0–36.0)
MCV: 87.9 fL (ref 80.0–100.0)
Metamyelocytes Relative: 4 %
Monocytes Absolute: 0.7 10*3/uL (ref 0.1–1.0)
Monocytes Relative: 7 %
Myelocytes: 7 %
Neutro Abs: 6.6 10*3/uL (ref 1.7–7.7)
Neutrophils Relative %: 64 %
Platelets: 206 10*3/uL (ref 150–400)
RBC: 3.97 MIL/uL (ref 3.87–5.11)
RDW: 12.5 % (ref 11.5–15.5)
WBC: 9.3 10*3/uL (ref 4.0–10.5)
nRBC: 0 % (ref 0.0–0.2)

## 2021-12-20 LAB — COMPREHENSIVE METABOLIC PANEL
ALT: 13 U/L (ref 0–44)
AST: 17 U/L (ref 15–41)
Albumin: 3.4 g/dL — ABNORMAL LOW (ref 3.5–5.0)
Alkaline Phosphatase: 71 U/L (ref 38–126)
Anion gap: 5 (ref 5–15)
BUN: 9 mg/dL (ref 6–20)
CO2: 25 mmol/L (ref 22–32)
Calcium: 8.5 mg/dL — ABNORMAL LOW (ref 8.9–10.3)
Chloride: 109 mmol/L (ref 98–111)
Creatinine, Ser: 0.73 mg/dL (ref 0.44–1.00)
GFR, Estimated: >60 mL/min (ref >60)
Glucose, Bld: 103 mg/dL — ABNORMAL HIGH (ref 70–99)
Potassium: 3.8 mmol/L (ref 3.5–5.1)
Sodium: 139 mmol/L (ref 135–145)
Total Bilirubin: 0.4 mg/dL (ref 0.3–1.2)
Total Protein: 6.7 g/dL (ref 6.5–8.1)

## 2021-12-20 LAB — MAGNESIUM: Magnesium: 2 mg/dL (ref 1.7–2.4)

## 2021-12-20 LAB — HIV ANTIBODY (ROUTINE TESTING W REFLEX): HIV Screen 4th Generation wRfx: NONREACTIVE

## 2021-12-20 LAB — PHOSPHORUS: Phosphorus: 3.7 mg/dL (ref 2.5–4.6)

## 2021-12-20 MED ORDER — SULFAMETHOXAZOLE-TRIMETHOPRIM 800-160 MG PO TABS
1.0000 | ORAL_TABLET | ORAL | Status: AC
  Administered 2021-12-20: 1 via ORAL
  Filled 2021-12-20: qty 1

## 2021-12-20 MED ORDER — ENOXAPARIN SODIUM 40 MG/0.4ML IJ SOSY: 40.0000 mg | PREFILLED_SYRINGE | INTRAMUSCULAR | Status: DC

## 2021-12-20 MED ORDER — SULFAMETHOXAZOLE-TRIMETHOPRIM 800-160 MG PO TABS: 1.0000 | ORAL_TABLET | Freq: Two times a day (BID) | ORAL | 0 refills | Status: AC

## 2021-12-20 MED FILL — Dexamethasone Sodium Phosphate Inj 100 MG/10ML: INTRAMUSCULAR | Qty: 1 | Status: AC

## 2021-12-20 MED FILL — Fosaprepitant Dimeglumine For IV Infusion 150 MG (Base Eq): INTRAVENOUS | Qty: 5 | Status: AC

## 2021-12-20 NOTE — Discharge Summary (Signed)
Physician Discharge Summary   [name] SWN:[id number] DOB: 1975-12-02 DOA: 12/19/2021  PCP: Pcp, No  Admit date: 12/19/2021 Discharge date: 12/20/2021  Barriers to discharge: none  Admitted From: Home Disposition: Home Discharging physician: Dwyane Dee, MD  Recommendations for Outpatient Follow-up:  Continue following with oncology  Home Health:  Equipment/Devices:   Discharge Condition: stable CODE STATUS: Full Diet recommendation:  Diet Orders (From admission, onward)     Start     Ordered   12/20/21 0000  Diet general        12/20/21 0913   12/19/21 2329  Diet regular Room service appropriate? Yes; Fluid consistency: Thin  Diet effective now       Question Answer Comment  Room service appropriate? Yes   Fluid consistency: Thin      12/19/21 2328            Hospital Course:  Hannah Murray is a 46 year old female with PMH right breast cancer s/p right mastectomy who presented with new lesions that developed along her right lower back and right knee. Her husband had recently worked outside with exposure to [place] and they initially thought it was related to possible exposure.  She tried taking some oral Benadryl with minimal relief.  She endorsed pain associated with the lesions and mild surrounding erythema.  There was no drainage appreciated.  She had not been on any other outpatient treatment prior to hospitalization.  She was afebrile with no leukocytosis on work-up.  Appearance of lesions did not appear to be consistent with insect/spider bite nor consistent with typical cellulitis.  There was no drainage appreciated.  They did have some appearance of pustules but without drainage.  She did endorse improvement in pain and surrounding erythema after being started on antibiotics.  She was continued on Bactrim at discharge to complete course. If no full resolution or does have worsening, she was recommended to present back for repeat evaluation.  Other  considered differential was possible zosters outbreak however appearance/locations was not consistent with dermatomal distribution and pain was not typical.   The patient's chronic medical conditions were treated accordingly per the patient's home medication regimen except as noted.  On day of discharge, patient was felt deemed stable for discharge. Patient/family member advised to call PCP or come back to ER if needed.   Principal Diagnosis: Cellulitis  Discharge Diagnoses: Active Hospital Problems   Diagnosis Date Noted   Cellulitis 12/19/2021    Resolved Hospital Problems  No resolved problems to display.     Discharge Instructions     Diet general   Complete by: As directed    Increase activity slowly   Complete by: As directed       Allergies as of 12/20/2021   No Known Allergies      Medication List     TAKE these medications    dexamethasone 4 MG tablet Commonly known as: DECADRON Take 2 tablets (8 mg total) by mouth daily for 3 days. Start the day after doxorubicin/cyclophosphamide chemotherapy. Take with food.   diphenhydrAMINE 25 MG tablet Commonly known as: BENADRYL Take 25 mg by mouth every 6 (six) hours as needed for itching.   lidocaine-prilocaine cream Commonly known as: EMLA Apply to affected area once What changed:  how much to take how to take this when to take this reasons to take this additional instructions   ondansetron 8 MG tablet Commonly known as: Zofran Take 1 tab (8 mg) by mouth every 8 hrs as needed  for nausea/vomiting. Start third day after doxorubicin/cyclophosphamide chemotherapy.   oxyCODONE 5 MG immediate release tablet Commonly known as: Roxicodone Take 1 tablet (5 mg total) by mouth every 6 (six) hours as needed for severe pain.   prochlorperazine 10 MG tablet Commonly known as: COMPAZINE Take 1 tablet (10 mg total) by mouth every 6 (six) hours as needed for nausea or vomiting.   sulfamethoxazole-trimethoprim 800-160  MG tablet Commonly known as: BACTRIM DS Take 1 tablet by mouth 2 (two) times daily for 7 days.        No Known Allergies  Consultations:   Procedures:   Discharge Exam: BP (!) 114/53   Pulse 95   Temp 97.9 F (36.6 C) (Oral)   Resp 20   Ht '5\' 5"'$  (1.651 m)   Wt 67 kg   LMP  (LMP Unknown)   SpO2 97%   BMI 24.58 kg/m  Physical Exam Constitutional:      Appearance: Normal appearance.  HENT:     Head: Normocephalic and atraumatic.     Mouth/Throat:     Mouth: Mucous membranes are moist.  Eyes:     Extraocular Movements: Extraocular movements intact.  Cardiovascular:     Rate and Rhythm: Normal rate and regular rhythm.  Pulmonary:     Effort: Pulmonary effort is normal.     Breath sounds: Normal breath sounds.  Abdominal:     General: Bowel sounds are normal.     Palpations: Abdomen is soft.  Musculoskeletal:        General: Normal range of motion.     Cervical back: Normal range of motion and neck supple.  Skin:    Comments: Small patch of pustules noted in right lower back with small necrotic centers and no surrounding drainage.  Mild surrounding erythema.  Similar appearance of a couple pustules noted on right knee more consistent with pustular acne  Neurological:     General: No focal deficit present.     Mental Status: She is alert.  Psychiatric:        Mood and Affect: Mood normal.      The results of significant diagnostics from this hospitalization (including imaging, microbiology, ancillary and laboratory) are listed below for reference.   Microbiology: No results found for this or any previous visit (from the past 240 hour(s)).   Labs: BNP (last 3 results) No results for input(s): "BNP" in the last 8760 hours. Basic Metabolic Panel: Recent Labs  Lab 12/19/21 2026 12/20/21 0900  NA 137 139  K 3.4* 3.8  CL 107 109  CO2 24 25  GLUCOSE 106* 103*  BUN 9 9  CREATININE 0.64 0.73  CALCIUM 8.9 8.5*  MG  --  2.0  PHOS  --  3.7   Liver  Function Tests: Recent Labs  Lab 12/19/21 2026 12/20/21 0900  AST 19 17  ALT 14 13  ALKPHOS 77 71  BILITOT 0.4 0.4  PROT 7.0 6.7  ALBUMIN 3.7 3.4*   No results for input(s): "LIPASE", "AMYLASE" in the last 168 hours. No results for input(s): "AMMONIA" in the last 168 hours. CBC: Recent Labs  Lab 12/19/21 2026 12/20/21 0900  WBC 8.9 9.3  NEUTROABS 4.9 6.6  HGB 10.8* 11.3*  HCT 33.6* 34.9*  MCV 88.9 87.9  PLT 214 206   Cardiac Enzymes: No results for input(s): "CKTOTAL", "CKMB", "CKMBINDEX", "TROPONINI" in the last 168 hours. BNP: Invalid input(s): "POCBNP" CBG: No results for input(s): "GLUCAP" in the last 168 hours. D-Dimer No results  for input(s): "DDIMER" in the last 72 hours. Hgb A1c No results for input(s): "HGBA1C" in the last 72 hours. Lipid Profile No results for input(s): "CHOL", "HDL", "LDLCALC", "TRIG", "CHOLHDL", "LDLDIRECT" in the last 72 hours. Thyroid function studies No results for input(s): "TSH", "T4TOTAL", "T3FREE", "THYROIDAB" in the last 72 hours.  Invalid input(s): "FREET3" Anemia work up No results for input(s): "VITAMINB12", "FOLATE", "FERRITIN", "TIBC", "IRON", "RETICCTPCT" in the last 72 hours. Urinalysis    Component Value Date/Time   COLORURINE YELLOW 12/20/2021 0908   APPEARANCEUR HAZY (A) 12/20/2021 0908   LABSPEC 1.006 12/20/2021 0908   PHURINE 6.0 12/20/2021 0908   GLUCOSEU NEGATIVE 12/20/2021 0908   HGBUR MODERATE (A) 12/20/2021 0908   BILIRUBINUR NEGATIVE 12/20/2021 0908   KETONESUR NEGATIVE 12/20/2021 0908   PROTEINUR NEGATIVE 12/20/2021 0908   NITRITE NEGATIVE 12/20/2021 0908   LEUKOCYTESUR NEGATIVE 12/20/2021 0908   Sepsis Labs Recent Labs  Lab 12/19/21 2026 12/20/21 0900  WBC 8.9 9.3   Microbiology No results found for this or any previous visit (from the past 240 hour(s)).  Procedures/Studies: DG Chest Port 1 View  Result Date: 12/19/2021 CLINICAL DATA:  Question sepsis.  Cancer patient.  Fever EXAM:  PORTABLE CHEST 1 VIEW COMPARISON:  Chest 11/08/2021 FINDINGS: Heart size and vascularity normal. Lungs clear without infiltrate or effusion Tissue expander right breast.  Port-A-Cath tip SVC unchanged. IMPRESSION: No active disease. Electronically Signed   By: Franchot Gallo M.D.   On: 12/19/2021 20:53   [place] RT UPPER EXTREM LTD SOFT TISSUE NON VASCULAR  Result Date: 12/02/2021 CLINICAL DATA:  Status post right mastectomy 09/30/2021. Patient reports a lump or bulging area in the right posterior back/axillary area which is soft and decreasing in size. EXAM: ULTRASOUND RIGHT UPPER EXTREMITY LIMITED TECHNIQUE: Ultrasound examination of the upper extremity soft tissues was performed in the area of clinical concern. COMPARISON:  None Available. FINDINGS: Examination is targeted to the right upper back. The contralateral left side was imaged for comparison. No focal soft tissue mass, fluid collection or abnormal fluid collection is seen in the area of concern. The muscles in this area appear grossly normal. IMPRESSION: No sonographic abnormality identified in the area of concern in the right upper back. Clinical follow-up recommended. If persistent concern for a mass, noncontrast chest CT suggested for initial assessment to assess for any asymmetry in this region. Electronically Signed   By: Richardean Sale M.D.   On: 12/02/2021 13:12     Time coordinating discharge: Over 21 minutes    Dwyane Dee, MD  [facility] 12/20/2021, 1:34 PM

## 2021-12-22 LAB — URINE CULTURE: Culture: >=100000 — AB

## 2021-12-23 ENCOUNTER — Inpatient Hospital Stay: Payer: No Typology Code available for payment source

## 2021-12-23 ENCOUNTER — Encounter: Payer: Self-pay | Admitting: Hematology and Oncology

## 2021-12-23 ENCOUNTER — Inpatient Hospital Stay (HOSPITAL_BASED_OUTPATIENT_CLINIC_OR_DEPARTMENT_OTHER): Payer: No Typology Code available for payment source | Admitting: Hematology and Oncology

## 2021-12-23 ENCOUNTER — Telehealth (HOSPITAL_BASED_OUTPATIENT_CLINIC_OR_DEPARTMENT_OTHER): Payer: Self-pay | Admitting: Emergency Medicine

## 2021-12-23 ENCOUNTER — Other Ambulatory Visit: Payer: Self-pay

## 2021-12-23 ENCOUNTER — Encounter: Payer: Self-pay | Admitting: *Deleted

## 2021-12-23 VITALS — HR 97

## 2021-12-23 DIAGNOSIS — C50411 Malignant neoplasm of upper-outer quadrant of right female breast: Secondary | ICD-10-CM

## 2021-12-23 LAB — CMP (CANCER CENTER ONLY)
ALT: 11 U/L (ref 0–44)
AST: 16 U/L (ref 15–41)
Albumin: 4.1 g/dL (ref 3.5–5.0)
Alkaline Phosphatase: 75 U/L (ref 38–126)
Anion gap: 8 (ref 5–15)
BUN: 12 mg/dL (ref 6–20)
CO2: 25 mmol/L (ref 22–32)
Calcium: 9 mg/dL (ref 8.9–10.3)
Chloride: 104 mmol/L (ref 98–111)
Creatinine: 0.81 mg/dL (ref 0.44–1.00)
GFR, Estimated: >60 mL/min (ref >60)
Glucose, Bld: 97 mg/dL (ref 70–99)
Potassium: 3.8 mmol/L (ref 3.5–5.1)
Sodium: 137 mmol/L (ref 135–145)
Total Bilirubin: 0.3 mg/dL (ref 0.3–1.2)
Total Protein: 7.6 g/dL (ref 6.5–8.1)

## 2021-12-23 LAB — CBC WITH DIFFERENTIAL (CANCER CENTER ONLY)
Abs Immature Granulocytes: 1.86 10*3/uL — ABNORMAL HIGH (ref 0.00–0.07)
Basophils Absolute: 0.1 10*3/uL (ref 0.0–0.1)
Basophils Relative: 1 %
Eosinophils Absolute: 0.1 10*3/uL (ref 0.0–0.5)
Eosinophils Relative: 1 %
HCT: 34.1 % — ABNORMAL LOW (ref 36.0–46.0)
Hemoglobin: 11.3 g/dL — ABNORMAL LOW (ref 12.0–15.0)
Immature Granulocytes: 21 %
Lymphocytes Relative: 17 %
Lymphs Abs: 1.6 10*3/uL (ref 0.7–4.0)
MCH: 28.1 pg (ref 26.0–34.0)
MCHC: 33.1 g/dL (ref 30.0–36.0)
MCV: 84.8 fL (ref 80.0–100.0)
Monocytes Absolute: 0.6 10*3/uL (ref 0.1–1.0)
Monocytes Relative: 6 %
Neutro Abs: 4.9 10*3/uL (ref 1.7–7.7)
Neutrophils Relative %: 54 %
Platelet Count: 235 10*3/uL (ref 150–400)
RBC: 4.02 MIL/uL (ref 3.87–5.11)
RDW: 12.5 % (ref 11.5–15.5)
Smear Review: NORMAL
WBC Count: 9.1 10*3/uL (ref 4.0–10.5)
nRBC: 0 % (ref 0.0–0.2)

## 2021-12-23 MED ORDER — HEPARIN SOD (PORK) LOCK FLUSH 100 UNIT/ML IV SOLN
500.0000 [IU] | Freq: Once | INTRAVENOUS | Status: AC | PRN
  Administered 2021-12-23: 500 [IU]

## 2021-12-23 MED ORDER — SODIUM CHLORIDE 0.9 % IV SOLN: Freq: Once | INTRAVENOUS | Status: AC

## 2021-12-23 MED ORDER — DOXORUBICIN HCL CHEMO IV INJECTION 2 MG/ML
60.0000 mg/m2 | Freq: Once | INTRAVENOUS | Status: AC
  Administered 2021-12-23: 106 mg via INTRAVENOUS
  Filled 2021-12-23: qty 53

## 2021-12-23 MED ORDER — SODIUM CHLORIDE 0.9 % IV SOLN
150.0000 mg | Freq: Once | INTRAVENOUS | Status: AC
  Administered 2021-12-23: 150 mg via INTRAVENOUS
  Filled 2021-12-23: qty 150

## 2021-12-23 MED ORDER — PALONOSETRON HCL INJECTION 0.25 MG/5ML
0.2500 mg | Freq: Once | INTRAVENOUS | Status: AC
  Administered 2021-12-23: 0.25 mg via INTRAVENOUS
  Filled 2021-12-23: qty 5

## 2021-12-23 MED ORDER — SODIUM CHLORIDE 0.9% FLUSH
10.0000 mL | INTRAVENOUS | Status: DC | PRN
  Administered 2021-12-23: 10 mL

## 2021-12-23 MED ORDER — SODIUM CHLORIDE 0.9 % IV SOLN
10.0000 mg | Freq: Once | INTRAVENOUS | Status: AC
  Administered 2021-12-23: 10 mg via INTRAVENOUS
  Filled 2021-12-23: qty 10

## 2021-12-23 MED ORDER — SODIUM CHLORIDE 0.9 % IV SOLN
600.0000 mg/m2 | Freq: Once | INTRAVENOUS | Status: AC
  Administered 2021-12-23: 1060 mg via INTRAVENOUS
  Filled 2021-12-23: qty 53

## 2021-12-23 NOTE — Progress Notes (Signed)
[facility] NOTE  Patient Care Team: Pcp, No as PCP - General Rockwell Germany, RN as Oncology Nurse Navigator Mauro Kaufmann, RN as Oncology Nurse Navigator [name], MD as Consulting Physician (Hematology and Oncology) Jovita Kussmaul, MD as Consulting Physician (General Surgery) Kyung Rudd, MD as Consulting Physician (Radiation Oncology) [name], MD as Consulting Physician (Hematology and Oncology)  CHIEF COMPLAINTS/PURPOSE OF CONSULTATION:  Newly diagnosed breast cancer  HISTORY OF PRESENTING ILLNESS:  Hannah Murray 46 y.o. female is here because of recent diagnosis of right breast cancer  I reviewed her records extensively and collaborated the history with the patient.  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 Indeterminate mass in the right breast at 12:30, which may represent a fibroadenoma. No evidence of right axillary adenopathy.   05/16/2021 Pathology Results   Right breast 10 0 clock mass biopsy showed invasive mammary carcinoma, ductal phenotype prognostics include ER 95% strong staining intensity PR 95% strong staining intensity Ki-67 of 40% and HER2 negative   06/11/2021 Cancer Staging   Staging form: Breast, AJCC 8th Edition - Pathologic: Stage IIA (pT2, pN1a, cM0, G3, ER+, PR+, HER2-, Oncotype DX score: 24) - Signed by [name], MD on 11/04/2021 Multigene prognostic tests performed: Oncotype DX Recurrence score range: Greater than or equal to 11 Histologic grading system: 3 grade system   07/10/2021 Genetic Testing   Negative hereditary cancer genetic testing: no pathogenic variants detected [facility] Breast STAT Panel or Common Hereditary Cancers +RNA Panel.  Report dates are Jul 10, 2021 and Jul 18, 2021.    The STAT Breast cancer panel offered by [facility] includes sequencing and rearrangement analysis for the following 9 genes:  ATM, BRCA1, BRCA2, CDH1, CHEK2, PALB2, PTEN, STK11 and TP53.   The Common Hereditary Cancers + RNA Panel offered by [facility] includes sequencing, deletion/duplication, and RNA testing of the following 47 genes: APC, ATM, AXIN2, BARD1, BMPR1A, BRCA1, BRCA2, BRIP1, CDH1, CDK4*, CDKN2A (p14ARF)*, CDKN2A (p16INK4a)*, CHEK2, CTNNA1, DICER1, EPCAM (Deletion/duplication testing only), GREM1 (promoter region deletion/duplication testing only), KIT, MEN1, MLH1, MSH2, MSH3, MSH6, MUTYH, NBN, NF1, NHTL1, PALB2, PDGFRA*, PMS2, POLD1, POLE, PTEN, RAD50, RAD51C, RAD51D, SDHB, SDHC, SDHD, SMAD4, SMARCA4. STK11, TP53, TSC1, TSC2, and VHL.  The following genes were evaluated for sequence changes only: SDHA and HOXB13 c.251G>A variant only.  RNA analysis is not performed for the * genes.     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  Right axillary lymph node evaluation showed 1 out of 8 lymph nodes positive for malignancy., final pathologic staging T2N1A.    11/11/2021 -  Chemotherapy   Patient is on Treatment Plan : BREAST ADJUVANT DOSE DENSE AC q14d / PACLitaxel H6W      A certified Patent attorney used for the conversation.   She had a skin rash, this was thought to be poison ivy.  Rest of the pertinent 10 point ROS reviewed and negative  SURGICAL HISTORY: Past Surgical History:  Procedure Laterality Date   BREAST RECONSTRUCTION WITH PLACEMENT OF TISSUE EXPANDER AND FLEX HD (ACELLULAR HYDRATED DERMIS) Right 09/30/2021   Procedure: BREAST RECONSTRUCTION  WITH PLACEMENT OF TISSUE EXPANDER AND FLEX HD (ACELLULAR HYDRATED DERMIS);  Surgeon: Wallace Going, DO;  Location: [facility];  Service:  Plastics;  Laterality: Right;   MASTECTOMY W/ SENTINEL NODE BIOPSY Right 09/30/2021   Procedure: RIGHT MASTECTOMY WITH SENTINEL LYMPH NODE BIOPSY;  Surgeon: Jovita Kussmaul, MD;  Location: [facility];  Service: General;  Laterality: Right;   PORTACATH PLACEMENT Left 11/08/2021   Procedure: INSERTION PORT-A-CATH;  Surgeon: Jovita Kussmaul, MD;  Location: [facility];  Service: General;  Laterality: Left;    SOCIAL HISTORY: Social History   Socioeconomic History   Marital status: Married    Spouse name: Not on file   Number of children: 1   Years of education: Not on file   Highest education level: High school graduate  Occupational History   Not on file  Tobacco Use   Smoking status: Never   Smokeless tobacco: Never  Vaping Use   Vaping Use: Never used  Substance and Sexual Activity   Alcohol use: Yes    Comment: occasionally   Drug use: Never   Sexual activity: Not Currently  Other Topics Concern   Not on file  Social History Narrative   Not on file   Social Determinants of Health   Financial Resource Strain: Not on file  Food Insecurity: No Food Insecurity (04/30/2021)   Hunger Vital Sign    Worried About Running Out of Food in the Last Year: Never true    Ran Out of Food in the Last Year: Never true  Transportation Needs: No Transportation Needs (04/30/2021)   PRAPARE - Hydrologist (Medical): No    Lack of Transportation (Non-Medical): No  Physical Activity: Not on file  Stress: Not on file  Social Connections: Not on file  Intimate Partner Violence: Not At Risk (07/02/2021)   Humiliation, Afraid, Rape, and Kick questionnaire    Fear of Current or Ex-Partner: No    Emotionally Abused: No    Physically Abused: No    Sexually Abused: No    FAMILY HISTORY: Family History  Problem Relation Age of Onset   Hypertension Father    Diabetes Father    Thyroid disease Father    Ovarian cancer Other        MGM's  niece; d. > 52    ALLERGIES:  has No Known Allergies.  MEDICATIONS:  Current Outpatient Medications  Medication Sig Dispense Refill   dexamethasone (DECADRON) 4 MG tablet Take 2 tablets (8 mg total) by mouth daily for 3 days. Start the day after doxorubicin/cyclophosphamide chemotherapy. Take with food. 30 tablet 1   diphenhydrAMINE (BENADRYL) 25 MG tablet Take 25 mg by mouth every 6 (six) hours as needed for itching.     lidocaine-prilocaine (EMLA) cream Apply to affected area once (Patient taking differently: Apply 1 Application topically as needed (access port).) 30 g 3   ondansetron (ZOFRAN) 8 MG tablet Take 1 tab (8 mg) by mouth every 8 hrs as needed for nausea/vomiting. Start third day after doxorubicin/cyclophosphamide chemotherapy. 30 tablet 1   oxyCODONE (ROXICODONE) 5 MG immediate release tablet Take 1 tablet (5 mg total) by mouth every 6 (six) hours as needed for severe pain. 10 tablet 0   prochlorperazine (COMPAZINE) 10 MG tablet Take 1 tablet (10 mg total) by mouth every 6 (six) hours as needed for nausea or vomiting. 30 tablet 1   sulfamethoxazole-trimethoprim (BACTRIM DS) 800-160 MG tablet Take 1 tablet  by mouth 2 (two) times daily for 7 days. 14 tablet 0   No current facility-administered medications for this visit.    REVIEW OF SYSTEMS:   Constitutional: Denies fevers, chills or abnormal night sweats Eyes: Denies blurriness of vision, double vision or watery eyes Ears, nose, mouth, throat, and face: Denies mucositis or sore throat Respiratory: Denies cough, dyspnea or wheezes Cardiovascular: Denies palpitation, chest discomfort or lower extremity swelling Gastrointestinal:  Denies nausea, heartburn or change in bowel habits Skin: Denies abnormal skin rashes Lymphatics: Denies new lymphadenopathy or easy bruising Neurological:Denies numbness, tingling or new weaknesses Behavioral/Psych: Mood is stable, no new changes  Breast: Denies any palpable lumps or discharge All  other systems were reviewed with the patient and are negative.  PHYSICAL EXAMINATION: ECOG PERFORMANCE STATUS: 0 - Asymptomatic  Vitals:   12/23/21 0918  BP: 129/76  Pulse: (!) 101  Resp: 18  Temp: 97.9 F (36.6 C)  SpO2: 98%   Filed Weights   12/23/21 0918  Weight: 145 lb 9.6 oz (66 kg)    Physical Exam Constitutional:      Appearance: Normal appearance.  Cardiovascular:     Rate and Rhythm: Normal rate and regular rhythm.  Abdominal:     General: Abdomen is flat. There is no distension.     Tenderness: There is no abdominal tenderness.  Musculoskeletal:        General: No swelling. Normal range of motion.     Cervical back: Normal range of motion and neck supple. No rigidity.  Lymphadenopathy:     Cervical: No cervical adenopathy.  Skin:    General: Skin is warm and dry.  Neurological:     Mental Status: She is alert.      LABORATORY DATA:  I have reviewed the data as listed Lab Results  Component Value Date   WBC 9.1 12/23/2021   HGB 11.3 (L) 12/23/2021   HCT 34.1 (L) 12/23/2021   MCV 84.8 12/23/2021   PLT 235 12/23/2021   Lab Results  Component Value Date   NA 139 12/20/2021   K 3.8 12/20/2021   CL 109 12/20/2021   CO2 25 12/20/2021    RADIOGRAPHIC STUDIES: I have personally reviewed the radiological reports and agreed with the findings in the report.  ASSESSMENT AND PLAN:  No problem-specific Assessment & Plan notes found for this encounter.   Total time spent: 20 minutes including history, review of records, counseling and coordination of care All questions were answered. The patient knows to call the clinic with any problems, questions or concerns.    [name], MD 12/23/21

## 2021-12-23 NOTE — Progress Notes (Signed)
ED Antimicrobial Stewardship Positive Culture Follow Up   Hannah Murray is an 45 y.o. female who presented to [facility] on 12/19/2021 with a chief complaint of  Chief Complaint  Patient presents with   Fever    Recent Results (from the past 720 hour(s))  Blood Culture (routine x 2)     Status: None (Preliminary result)   Collection Time: 12/19/21  8:26 PM   Specimen: BLOOD  Result Value Ref Range Status   Specimen Description   Final    BLOOD SITE NOT SPECIFIED Performed at [facility] [address], [place], [place] [address]    Special Requests   Final    BOTTLES DRAWN AEROBIC AND ANAEROBIC Blood Culture adequate volume Performed at [facility] [address] [place], [place] [address]    Culture   Final    NO GROWTH 2 DAYS Performed at [facility] [address] [place], [place] [address]    Report Status PENDING  Incomplete  Blood Culture (routine x 2)     Status: None (Preliminary result)   Collection Time: 12/20/21  9:00 AM   Specimen: BLOOD RIGHT HAND  Result Value Ref Range Status   Specimen Description   Final    BLOOD RIGHT HAND Performed at [facility], [place] [address] [place], [place] [address]    Special Requests   Final    BOTTLES DRAWN AEROBIC AND ANAEROBIC Blood Culture results may not be optimal due to an inadequate volume of blood received in culture bottles Performed at [facility] [address] [place], [place] [address]    Culture   Final    NO GROWTH 2 DAYS Performed at [facility] [address], [place], [place] [address]    Report Status PENDING  Incomplete  Urine Culture     Status: Abnormal   Collection Time: 12/20/21  9:09 AM   Specimen: In/Out Cath Urine  Result Value Ref Range Status   Specimen Description   Final    IN/OUT CATH URINE Performed at [facility] [address], [place], [place] [address]    Special Requests    Final    NONE Performed at [facility], [place] [address] [place], [place] [address]    Culture (A)  Final    >=100,000 COLONIES/mL ENTEROCOCCUS FAECALIS 10,000 COLONIES/mL ESCHERICHIA COLI    Report Status 12/22/2021 FINAL  Final   Organism ID, Bacteria ENTEROCOCCUS FAECALIS (A)  Final   Organism ID, Bacteria ESCHERICHIA COLI (A)  Final      Susceptibility   Escherichia coli - MIC*    AMPICILLIN <=2 SENSITIVE Sensitive     CEFAZOLIN <=4 SENSITIVE Sensitive     CEFEPIME <=0.12 SENSITIVE Sensitive     CEFTRIAXONE <=0.25 SENSITIVE Sensitive     CIPROFLOXACIN <=0.25 SENSITIVE Sensitive     GENTAMICIN <=1 SENSITIVE Sensitive     IMIPENEM <=0.25 SENSITIVE Sensitive     NITROFURANTOIN <=16 SENSITIVE Sensitive     TRIMETH/SULFA <=20 SENSITIVE Sensitive     AMPICILLIN/SULBACTAM <=2 SENSITIVE Sensitive     PIP/TAZO <=4 SENSITIVE Sensitive     * 10,000 COLONIES/mL ESCHERICHIA COLI   Enterococcus faecalis - MIC*    AMPICILLIN <=2 SENSITIVE Sensitive     NITROFURANTOIN <=16 SENSITIVE Sensitive     VANCOMYCIN 1 SENSITIVE Sensitive     * >=100,000 COLONIES/mL ENTEROCOCCUS FAECALIS    Discharged on Bactrim for cellulitis, no  treatment for UTI at discharge. Based on lack of symptoms, suspect colonization; no UTI treatment indicated at this time.   ED Provider: Quentin Mulling, PA-C   Junius Finner, PharmD [place] [phone] 12/23/2021, 8:50 AM Monday - Friday phone -  [phone] Saturday - Sunday phone - [phone]

## 2021-12-23 NOTE — Patient Instructions (Signed)
Instrucciones al darle de alta: Discharge Instructions Gracias por elegir al [facility] de Cncer de [place] para brindarle atencin mdica de oncologa y Music therapist.   Si usted tiene una cita de laboratorio con [place], por favor vaya directamente [place] y regstrese en el rea de Control and instrumentation engineer.   Use ropa cmoda y [place] para tener fcil acceso a las vas del Portacath (acceso venoso de Engineer, site duracin) o la lnea PICC (catter central colocado por va perifrica).   Nos esforzamos por ofrecerle tiempo de calidad con su proveedor. Es posible que tenga que volver a programar su cita si llega tarde (15 minutos o ms).  El llegar tarde le afecta a usted y a otros pacientes cuyas citas son posteriores a Merchandiser, retail.  Adems, si usted falta a tres o ms citas sin avisar a la oficina, puede ser retirado(a) de la clnica a discrecin del proveedor.      Para las solicitudes de renovacin de recetas, pida a su farmacia que se ponga en contacto con nuestra oficina y deje que transcurran 21 horas para que se complete el proceso de las renovaciones.    Hoy usted recibi los siguientes agentes de quimioterapia e/o inmunoterapia: doxorubicin, cyclophosphamide      Para ayudar a prevenir las nuseas y los vmitos despus de su tratamiento, le recomendamos que tome su medicamento para las nuseas segn las indicaciones.  LOS SNTOMAS QUE DEBEN COMUNICARSE INMEDIATAMENTE SE INDICAN A CONTINUACIN: *FIEBRE SUPERIOR A 100.4 F (38 C) O MS *ESCALOFROS O SUDORACIN *NUSEAS Y VMITOS QUE NO SE CONTROLAN CON EL MEDICAMENTO PARA LAS NUSEAS *DIFICULTAD INUSUAL PARA RESPIRAR  *MORETONES O HEMORRAGIAS NO HABITUALES *PROBLEMAS URINARIOS (dolor o ardor al Garment/textile technologist o frecuencia para Garment/textile technologist) *PROBLEMAS INTESTINALES (diarrea inusual, estreimiento, dolor cerca del ano) SENSIBILIDAD EN LA BOCA Y EN LA GARGANTA CON O SIN LA PRESENCIA DE LCERAS (dolor de garganta, llagas en la boca o dolor de  muelas/dientes) ERUPCIN, HINCHAZN O DOLORES INUSUALES FLUJO VAGINAL INUSUAL O PICAZN/RASQUIA    Los puntos marcados con un asterisco ( *) indican una posible emergencia y debe hacer un seguimiento tan pronto como le sea posible o vaya al Departamento de Emergencias si se le presenta algn problema.  Por favor, muestre la [place] DE ADVERTENCIA DE Windy Canny DE ADVERTENCIA DE Benay Spice al registrarse en [address] de Emergencias y a la enfermera de triaje.  Si tiene preguntas despus de su visita o necesita cancelar o volver a programar su cita, por favor pngase en contacto con [facility] [place]  Dept: [phone] y [place]. Las horas de oficina son de 8:00 a.m. a 4:30 p.m. de lunes a viernes. Por favor, tenga en cuenta que los mensajes de voz que se dejan despus de las 4:00 p.m. posiblemente no se devolvern hasta el siguiente da de [place].  Cerramos los fines de semana y [employer]. En todo momento tiene acceso a una enfermera para preguntas urgentes. Por favor, llame al nmero principal de la clnica Dept: [phone] y [place] instrucciones.   Para cualquier pregunta que no sea de carcter urgente, tambin puede ponerse en contacto con su proveedor [employer] Ahora ofrecemos visitas electrnicas para cualquier persona mayor de 18 aos que solicite atencin mdica en lnea para los sntomas que no sean urgentes. Para ms detalles vaya a [email or web address].   Tambin puede bajar la aplicacin de MyChart! Vaya a la tienda de aplicaciones, busque "MyChart", abra la aplicacin,  seleccione [place], e ingrese con su nombre de usuario y la contrasea de Pharmacist, community.  Las mscaras son opcionales en los centros de Hotel manager. Si desea que su equipo de cuidados mdicos use una [employer] atienden, por favor hgaselo saber al personal. Bethann Berkshire una persona de apoyo que tenga por lo menos 16 aos  para que le acompae a sus citas.

## 2021-12-23 NOTE — Progress Notes (Signed)
[place] NOTE  Patient Care Team: Pcp, No as PCP - General [place], RN as Oncology Nurse Navigator Mauro Kaufmann, RN as Oncology Nurse Navigator [name], MD as Consulting Physician (Hematology and Oncology) Jovita Kussmaul, MD as Consulting Physician (General Surgery) Kyung Rudd, MD as Consulting Physician (Radiation Oncology) [name], MD as Consulting Physician (Hematology and Oncology)  CHIEF COMPLAINTS/PURPOSE OF CONSULTATION:  Newly diagnosed breast cancer  HISTORY OF PRESENTING ILLNESS:  Hannah Murray 46 y.o. female is here because of recent diagnosis of right breast cancer  I reviewed her records extensively and collaborated the history with the patient.  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 Indeterminate mass in the right breast at 12:30, which may represent a fibroadenoma. No evidence of right axillary adenopathy.   05/16/2021 Pathology Results   Right breast 10 0 clock mass biopsy showed invasive mammary carcinoma, ductal phenotype prognostics include ER 95% strong staining intensity PR 95% strong staining intensity Ki-67 of 40% and HER2 negative   06/11/2021 Cancer Staging   Staging form: Breast, AJCC 8th Edition - Pathologic: Stage IIA (pT2, pN1a, cM0, G3, ER+, PR+, HER2-, Oncotype DX score: 24) - Signed by [name], MD on 11/04/2021 Multigene prognostic tests performed: Oncotype DX Recurrence score range: Greater than or equal to 11 Histologic grading system: 3 grade system   07/10/2021 Genetic Testing   Negative hereditary cancer genetic testing: no pathogenic variants detected [facility] Breast STAT Panel or Common Hereditary Cancers +RNA Panel.  Report dates are Jul 10, 2021 and Jul 18, 2021.    The STAT Breast cancer panel offered by [facility] includes sequencing and rearrangement analysis for the following 9 genes:  ATM, BRCA1, BRCA2, CDH1, CHEK2, PALB2, PTEN, STK11 and TP53.   The Common Hereditary Cancers + RNA Panel offered by [facility] includes sequencing, deletion/duplication, and RNA testing of the following 47 genes: APC, ATM, AXIN2, BARD1, BMPR1A, BRCA1, BRCA2, BRIP1, CDH1, CDK4*, CDKN2A (p14ARF)*, CDKN2A (p16INK4a)*, CHEK2, CTNNA1, DICER1, EPCAM (Deletion/duplication testing only), GREM1 (promoter region deletion/duplication testing only), KIT, MEN1, MLH1, MSH2, MSH3, MSH6, MUTYH, NBN, NF1, NHTL1, PALB2, PDGFRA*, PMS2, POLD1, POLE, PTEN, RAD50, RAD51C, RAD51D, SDHB, SDHC, SDHD, SMAD4, SMARCA4. STK11, TP53, TSC1, TSC2, and VHL.  The following genes were evaluated for sequence changes only: SDHA and HOXB13 c.251G>A variant only.  RNA analysis is not performed for the * genes.     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  Right axillary lymph node evaluation showed 1 out of 8 lymph nodes positive for malignancy., final pathologic staging T2N1A.    11/11/2021 -  Chemotherapy   Patient is on Treatment Plan : BREAST ADJUVANT DOSE DENSE AC q14d / PACLitaxel H3Z      A certified Patent attorney used for the conversation.   Patient is here before planned cycle 4 of Adriamycin and cyclophosphamide.  Since last visit, she had a skin rash which was thought to be related to some poison ivy.  She was seen in the ER.  She complains of some itching around the skin lesion but overall recovering.  She did not have  any need to take any medication.  She complains of mild insomnia.  No nausea or vomiting or diarrhea.  Overall she has been tolerating the chemotherapy very very well. Rest of the pertinent 10 point ROS reviewed  and negative  SURGICAL HISTORY: Past Surgical History:  Procedure Laterality Date   BREAST RECONSTRUCTION WITH PLACEMENT OF TISSUE EXPANDER AND FLEX HD (ACELLULAR HYDRATED DERMIS) Right 09/30/2021   Procedure: BREAST RECONSTRUCTION WITH PLACEMENT OF TISSUE EXPANDER AND FLEX HD (ACELLULAR HYDRATED DERMIS);  Surgeon: Wallace Going, DO;  Location: [facility];  Service: Plastics;  Laterality: Right;   MASTECTOMY W/ SENTINEL NODE BIOPSY Right 09/30/2021   Procedure: RIGHT MASTECTOMY WITH SENTINEL LYMPH NODE BIOPSY;  Surgeon: Jovita Kussmaul, MD;  Location: [facility];  Service: General;  Laterality: Right;   PORTACATH PLACEMENT Left 11/08/2021   Procedure: INSERTION PORT-A-CATH;  Surgeon: Jovita Kussmaul, MD;  Location: [facility];  Service: General;  Laterality: Left;    SOCIAL HISTORY: Social History   Socioeconomic History   Marital status: Married    Spouse name: Not on file   Number of children: 1   Years of education: Not on file   Highest education level: High school graduate  Occupational History   Not on file  Tobacco Use   Smoking status: Never   Smokeless tobacco: Never  Vaping Use   Vaping Use: Never used  Substance and Sexual Activity   Alcohol use: Yes    Comment: occasionally   Drug use: Never   Sexual activity: Not Currently  Other Topics Concern   Not on file  Social History Narrative   Not on file   Social Determinants of Health   Financial Resource Strain: Not on file  Food Insecurity: No Food Insecurity (04/30/2021)   Hunger Vital Sign    Worried About Running Out of Food in the Last Year: Never true    Ran Out of Food in the Last Year: Never true  Transportation Needs: No Transportation Needs (04/30/2021)   PRAPARE - Hydrologist (Medical): No    Lack of Transportation (Non-Medical): No  Physical Activity: Not on file  Stress: Not on file  Social Connections: Not on file   Intimate Partner Violence: Not At Risk (07/02/2021)   Humiliation, Afraid, Rape, and Kick questionnaire    Fear of Current or Ex-Partner: No    Emotionally Abused: No    Physically Abused: No    Sexually Abused: No    FAMILY HISTORY: Family History  Problem Relation Age of Onset   Hypertension Father    Diabetes Father    Thyroid disease Father    Ovarian cancer Other        MGM's niece; d. > 31    ALLERGIES:  has No Known Allergies.  MEDICATIONS:  Current Outpatient Medications  Medication Sig Dispense Refill   dexamethasone (DECADRON) 4 MG tablet Take 2 tablets (8 mg total) by mouth daily for 3 days. Start the day after doxorubicin/cyclophosphamide chemotherapy. Take with food. 30 tablet 1   diphenhydrAMINE (BENADRYL) 25 MG tablet Take 25 mg by mouth every 6 (six) hours as needed for itching.     lidocaine-prilocaine (EMLA) cream Apply to affected area once (Patient taking differently: Apply 1 Application topically as needed (access port).) 30 g 3   ondansetron (ZOFRAN) 8 MG tablet Take 1 tab (8 mg) by mouth every 8 hrs as needed for nausea/vomiting. Start third day after doxorubicin/cyclophosphamide  chemotherapy. 30 tablet 1   oxyCODONE (ROXICODONE) 5 MG immediate release tablet Take 1 tablet (5 mg total) by mouth every 6 (six) hours as needed for severe pain. 10 tablet 0   prochlorperazine (COMPAZINE) 10 MG tablet Take 1 tablet (10 mg total) by mouth every 6 (six) hours as needed for nausea or vomiting. 30 tablet 1   sulfamethoxazole-trimethoprim (BACTRIM DS) 800-160 MG tablet Take 1 tablet by mouth 2 (two) times daily for 7 days. 14 tablet 0   No current facility-administered medications for this visit.    REVIEW OF SYSTEMS:   Constitutional: Denies fevers, chills or abnormal night sweats Eyes: Denies blurriness of vision, double vision or watery eyes Ears, nose, mouth, throat, and face: Denies mucositis or sore throat Respiratory: Denies cough, dyspnea or  wheezes Cardiovascular: Denies palpitation, chest discomfort or lower extremity swelling Gastrointestinal:  Denies nausea, heartburn or change in bowel habits Skin: Denies abnormal skin rashes Lymphatics: Denies new lymphadenopathy or easy bruising Neurological:Denies numbness, tingling or new weaknesses Behavioral/Psych: Mood is stable, no new changes  Breast: Denies any palpable lumps or discharge All other systems were reviewed with the patient and are negative.  PHYSICAL EXAMINATION: ECOG PERFORMANCE STATUS: 0 - Asymptomatic  Vitals:   12/23/21 0918  BP: 129/76  Pulse: (!) 101  Resp: 18  Temp: 97.9 F (36.6 C)  SpO2: 98%   Filed Weights   12/23/21 0918  Weight: 145 lb 9.6 oz (66 kg)    Physical Exam Constitutional:      Appearance: Normal appearance.  Cardiovascular:     Rate and Rhythm: Normal rate and regular rhythm.  Abdominal:     General: Abdomen is flat. There is no distension.     Tenderness: There is no abdominal tenderness.  Musculoskeletal:        General: No swelling. Normal range of motion.     Cervical back: Normal range of motion and neck supple. No rigidity.  Lymphadenopathy:     Cervical: No cervical adenopathy.  Skin:    General: Skin is warm and dry.     Comments: Please see photos in media.  She has scattered waxy papules with central dip.  Mild erythema surrounding the lesion.  Neurological:     Mental Status: She is alert.      LABORATORY DATA:  I have reviewed the data as listed Lab Results  Component Value Date   WBC 9.1 12/23/2021   HGB 11.3 (L) 12/23/2021   HCT 34.1 (L) 12/23/2021   MCV 84.8 12/23/2021   PLT 235 12/23/2021   Lab Results  Component Value Date   NA 137 12/23/2021   K 3.8 12/23/2021   CL 104 12/23/2021   CO2 25 12/23/2021    RADIOGRAPHIC STUDIES: I have personally reviewed the radiological reports and agreed with the findings in the report.  ASSESSMENT AND PLAN:   Primary malignant neoplasm of upper  outer quadrant of breast, right [facility]) This is a 46 year old female patient with newly diagnosed right breast IDC, ER/PR positive, HER2 negative, clinically stage T2 N0 M0 grade 2/3 referred to medical oncology for recommendations.  Since last visit she had mastectomy which showed grade 3 invasive ductal carcinoma measuring 4.3 cm in greatest dimension, margins negative along with solid type DCIS high-grade.  Right axillary lymph node evaluation showed 1 out of 8 lymph nodes positive for malignancy.  Oncotype test resulted at 24, benefit of chemotherapy cannot be excluded. During her last visit, I recommended, given young age, high-grade, Oncotype  of 24 and positive lymph node involvement.  She is agreeable to proceeding with chemotherapy.  We have discussed about her dose dense AC followed by T.  She is now undergoing chemotherapy and is here before planned cycle 4 of AC.  Overall she has been tolerating it very well.  Since last visit she had a skin rash which was not directly thought to be related to the chemotherapy but she did not have any worsening of this rash.  We will continue to monitor it.  At this time it was thought to be related to poison ivy.  She was seen in the ER and discharged.  No other toxicity reported.  Physical examination unremarkable for the skin rash.  CBC briefly reviewed, complete report pending, okay to proceed with chemotherapy if all labs are within parameters.  She understands that Taxol can cause neuropathy.  She is planning to use the cold/ice to minimize the risk of neuropathy. Return to clinic as planned in the integrated scheduling.   Total time spent: 30 minutes including history, review of records, counseling and coordination of care All questions were answered. The patient knows to call the clinic with any problems, questions or concerns.    [name], MD 12/23/21

## 2021-12-23 NOTE — Assessment & Plan Note (Signed)
This is a 46 year old female patient with newly diagnosed right breast IDC, ER/PR positive, HER2 negative, clinically stage T2 N0 M0 grade 2/3 referred to medical oncology for recommendations.  Since last visit she had mastectomy which showed grade 3 invasive ductal carcinoma measuring 4.3 cm in greatest dimension, margins negative along with solid type DCIS high-grade.  Right axillary lymph node evaluation showed 1 out of 8 lymph nodes positive for malignancy.  Oncotype test resulted at 24, benefit of chemotherapy cannot be excluded. During her last visit, I recommended, given young age, high-grade, Oncotype of 24 and positive lymph node involvement.  She is agreeable to proceeding with chemotherapy.  We have discussed about her dose dense AC followed by T.  She is now undergoing chemotherapy and is here before planned cycle 4 of AC.  Overall she has been tolerating it very well.  Since last visit she had a skin rash which was not directly thought to be related to the chemotherapy but she did not have any worsening of this rash.  We will continue to monitor it.  At this time it was thought to be related to poison ivy.  She was seen in the ER and discharged.  No other toxicity reported.  Physical examination unremarkable for the skin rash.  CBC briefly reviewed, complete report pending, okay to proceed with chemotherapy if all labs are within parameters.  She understands that Taxol can cause neuropathy.  She is planning to use the cold/ice to minimize the risk of neuropathy. Return to clinic as planned in the integrated scheduling.

## 2021-12-23 NOTE — Telephone Encounter (Signed)
Post ED Visit - Positive Culture Follow-up  Culture report reviewed by antimicrobial stewardship pharmacist: [place] Team '[]'$  Elenor Quinones, Pharm.D. '[]'$  Heide Guile, Pharm.D., BCPS AQ-ID '[]'$  Parks Neptune, Pharm.D., BCPS '[]'$  Alycia Rossetti, Pharm.D., BCPS '[]'$  [place], Pharm.D., BCPS, AAHIVP '[]'$  Legrand Como, Pharm.D., BCPS, AAHIVP '[]'$  Salome Arnt, PharmD, BCPS '[]'$  Johnnette Gourd, PharmD, BCPS '[]'$  Hughes Better, PharmD, BCPS '[]'$  Leeroy Cha, PharmD '[]'$  Laqueta Linden, PharmD, BCPS '[]'$  Albertina Parr, PharmD  [place] Team '[]'$  Leodis Sias, PharmD '[]'$  Lindell Spar, PharmD '[]'$  Royetta Asal, PharmD '[]'$  Graylin Shiver, Rph '[]'$  Rema Fendt) Glennon Mac, PharmD '[]'$  Arlyn Dunning, PharmD '[]'$  Netta Cedars, PharmD '[]'$  Dia Sitter, PharmD '[]'$  Leone Haven, PharmD '[]'$  Gretta Arab, PharmD '[]'$  Theodis Shove, PharmD '[]'$  Peggyann Juba, PharmD '[]'$  Reuel Boom, PharmD Jimmy Footman PharmD   Positive urine culture Treated with sulfamethoxazole-trimethoprim, organism sensitive to the same and no further patient follow-up is required at this time.  Hazle Nordmann 12/23/2021, 9:54 AM

## 2021-12-25 ENCOUNTER — Inpatient Hospital Stay: Payer: No Typology Code available for payment source | Attending: Hematology and Oncology

## 2021-12-25 VITALS — BP 122/76 | HR 107 | Temp 98.8°F | Resp 16

## 2021-12-25 DIAGNOSIS — Z5189 Encounter for other specified aftercare: Secondary | ICD-10-CM | POA: Insufficient documentation

## 2021-12-25 DIAGNOSIS — J029 Acute pharyngitis, unspecified: Secondary | ICD-10-CM | POA: Insufficient documentation

## 2021-12-25 DIAGNOSIS — Z170 Estrogen receptor positive status [ER+]: Secondary | ICD-10-CM | POA: Insufficient documentation

## 2021-12-25 DIAGNOSIS — C50411 Malignant neoplasm of upper-outer quadrant of right female breast: Secondary | ICD-10-CM | POA: Insufficient documentation

## 2021-12-25 DIAGNOSIS — Z5111 Encounter for antineoplastic chemotherapy: Secondary | ICD-10-CM | POA: Insufficient documentation

## 2021-12-25 LAB — CULTURE, BLOOD (ROUTINE X 2)
Culture: NO GROWTH
Culture: NO GROWTH
Special Requests: ADEQUATE

## 2021-12-25 MED ORDER — PEGFILGRASTIM-CBQV 6 MG/0.6ML ~~LOC~~ SOSY
6.0000 mg | PREFILLED_SYRINGE | Freq: Once | SUBCUTANEOUS | Status: AC
  Administered 2021-12-25: 6 mg via SUBCUTANEOUS
  Filled 2021-12-25: qty 0.6

## 2021-12-26 ENCOUNTER — Other Ambulatory Visit: Payer: Self-pay | Admitting: Hematology and Oncology

## 2021-12-26 MED ORDER — ZOLPIDEM TARTRATE 5 MG PO TABS: 5.0000 mg | ORAL_TABLET | Freq: Every evening | ORAL | 0 refills | Status: DC | PRN

## 2021-12-26 NOTE — Progress Notes (Signed)
Ambien sent to the pharmacy of her choice for insomnia.

## 2021-12-27 ENCOUNTER — Encounter: Payer: Self-pay | Admitting: *Deleted

## 2021-12-30 ENCOUNTER — Encounter: Payer: Self-pay | Admitting: *Deleted

## 2022-01-01 ENCOUNTER — Other Ambulatory Visit: Payer: Self-pay | Admitting: Hematology and Oncology

## 2022-01-01 ENCOUNTER — Encounter: Payer: Self-pay | Admitting: *Deleted

## 2022-01-01 DIAGNOSIS — C50411 Malignant neoplasm of upper-outer quadrant of right female breast: Secondary | ICD-10-CM

## 2022-01-02 ENCOUNTER — Other Ambulatory Visit: Payer: Self-pay

## 2022-01-03 ENCOUNTER — Telehealth: Payer: Self-pay | Admitting: Physician Assistant

## 2022-01-03 ENCOUNTER — Ambulatory Visit: Payer: No Typology Code available for payment source | Admitting: Physician Assistant

## 2022-01-03 MED FILL — Dexamethasone Sodium Phosphate Inj 100 MG/10ML: INTRAMUSCULAR | Qty: 1 | Status: AC

## 2022-01-03 NOTE — Telephone Encounter (Signed)
I spoke with Hannah Murray via interpretor. Her daughter is sick and needed to change her appointment. She will follow up next Friday.

## 2022-01-06 ENCOUNTER — Other Ambulatory Visit: Payer: Self-pay

## 2022-01-06 ENCOUNTER — Inpatient Hospital Stay: Payer: No Typology Code available for payment source

## 2022-01-06 ENCOUNTER — Inpatient Hospital Stay (HOSPITAL_BASED_OUTPATIENT_CLINIC_OR_DEPARTMENT_OTHER): Payer: No Typology Code available for payment source | Admitting: Hematology and Oncology

## 2022-01-06 VITALS — BP 133/77 | HR 83 | Temp 98.0°F | Resp 18

## 2022-01-06 VITALS — BP 139/84 | HR 96 | Temp 97.7°F | Resp 16 | Ht 65.0 in | Wt 143.7 lb

## 2022-01-06 DIAGNOSIS — C50411 Malignant neoplasm of upper-outer quadrant of right female breast: Secondary | ICD-10-CM

## 2022-01-06 DIAGNOSIS — Z95828 Presence of other vascular implants and grafts: Secondary | ICD-10-CM

## 2022-01-06 LAB — CMP (CANCER CENTER ONLY)
ALT: 8 U/L (ref 0–44)
AST: 13 U/L — ABNORMAL LOW (ref 15–41)
Albumin: 4.4 g/dL (ref 3.5–5.0)
Alkaline Phosphatase: 73 U/L (ref 38–126)
Anion gap: 5 (ref 5–15)
BUN: 17 mg/dL (ref 6–20)
CO2: 27 mmol/L (ref 22–32)
Calcium: 8.9 mg/dL (ref 8.9–10.3)
Chloride: 106 mmol/L (ref 98–111)
Creatinine: 0.71 mg/dL (ref 0.44–1.00)
GFR, Estimated: >60 mL/min (ref >60)
Glucose, Bld: 101 mg/dL — ABNORMAL HIGH (ref 70–99)
Potassium: 3.8 mmol/L (ref 3.5–5.1)
Sodium: 138 mmol/L (ref 135–145)
Total Bilirubin: 0.2 mg/dL — ABNORMAL LOW (ref 0.3–1.2)
Total Protein: 7.5 g/dL (ref 6.5–8.1)

## 2022-01-06 LAB — CBC WITH DIFFERENTIAL (CANCER CENTER ONLY)
Abs Immature Granulocytes: 2.53 10*3/uL — ABNORMAL HIGH (ref 0.00–0.07)
Basophils Absolute: 0.1 10*3/uL (ref 0.0–0.1)
Basophils Relative: 1 %
Eosinophils Absolute: 0.2 10*3/uL (ref 0.0–0.5)
Eosinophils Relative: 1 %
HCT: 35.4 % — ABNORMAL LOW (ref 36.0–46.0)
Hemoglobin: 11.5 g/dL — ABNORMAL LOW (ref 12.0–15.0)
Immature Granulocytes: 19 %
Lymphocytes Relative: 14 %
Lymphs Abs: 1.8 10*3/uL (ref 0.7–4.0)
MCH: 28.4 pg (ref 26.0–34.0)
MCHC: 32.5 g/dL (ref 30.0–36.0)
MCV: 87.4 fL (ref 80.0–100.0)
Monocytes Absolute: 1.1 10*3/uL — ABNORMAL HIGH (ref 0.1–1.0)
Monocytes Relative: 9 %
Neutro Abs: 7.5 10*3/uL (ref 1.7–7.7)
Neutrophils Relative %: 56 %
Platelet Count: 192 10*3/uL (ref 150–400)
RBC: 4.05 MIL/uL (ref 3.87–5.11)
RDW: 13.4 % (ref 11.5–15.5)
WBC Count: 13.2 10*3/uL — ABNORMAL HIGH (ref 4.0–10.5)
nRBC: 0.2 % (ref 0.0–0.2)

## 2022-01-06 MED ORDER — SODIUM CHLORIDE 0.9 % IV SOLN
10.0000 mg | Freq: Once | INTRAVENOUS | Status: AC
  Administered 2022-01-06: 10 mg via INTRAVENOUS
  Filled 2022-01-06: qty 10

## 2022-01-06 MED ORDER — ALTEPLASE 2 MG IJ SOLR
2.0000 mg | Freq: Once | INTRAMUSCULAR | Status: DC | PRN
  Filled 2022-01-06: qty 2

## 2022-01-06 MED ORDER — SODIUM CHLORIDE 0.9 % IV SOLN: Freq: Once | INTRAVENOUS | Status: AC

## 2022-01-06 MED ORDER — FAMOTIDINE IN NACL 20-0.9 MG/50ML-% IV SOLN
20.0000 mg | Freq: Once | INTRAVENOUS | Status: AC
  Administered 2022-01-06: 20 mg via INTRAVENOUS
  Filled 2022-01-06: qty 50

## 2022-01-06 MED ORDER — CETIRIZINE HCL 10 MG/ML IV SOLN
10.0000 mg | Freq: Once | INTRAVENOUS | Status: AC
  Administered 2022-01-06: 10 mg via INTRAVENOUS
  Filled 2022-01-06: qty 1

## 2022-01-06 MED ORDER — SODIUM CHLORIDE 0.9 % IV SOLN
80.0000 mg/m2 | Freq: Once | INTRAVENOUS | Status: AC
  Administered 2022-01-06: 138 mg via INTRAVENOUS
  Filled 2022-01-06: qty 23

## 2022-01-06 MED ORDER — SODIUM CHLORIDE 0.9% FLUSH
10.0000 mL | INTRAVENOUS | Status: AC | PRN
  Administered 2022-01-06: 10 mL

## 2022-01-06 NOTE — Patient Instructions (Signed)
Instrucciones al darle de alta: Discharge Instructions Gracias por elegir al [facility] de Cncer de  para brindarle atencin mdica de oncologa y Music therapist.   Si usted tiene una cita de laboratorio con [place], por favor vaya directamente [place] y regstrese en el rea de Control and instrumentation engineer.   Use ropa cmoda y [place] para tener fcil acceso a las vas del Portacath (acceso venoso de Engineer, site duracin) o la lnea PICC (catter central colocado por va perifrica).   Nos esforzamos por ofrecerle tiempo de calidad con su proveedor. Es posible que tenga que volver a programar su cita si llega tarde (15 minutos o ms).  El llegar tarde le afecta a usted y a otros pacientes cuyas citas son posteriores a Merchandiser, retail.  Adems, si usted falta a tres o ms citas sin avisar a la oficina, puede ser retirado(a) de la clnica a discrecin del proveedor.      Para las solicitudes de renovacin de recetas, pida a su farmacia que se ponga en contacto con nuestra oficina y deje que transcurran 3 horas para que se complete el proceso de las renovaciones.    Hoy usted recibi los siguientes agentes de quimioterapia e/o inmunoterapia; Paclitaxel      Para ayudar a prevenir las nuseas y los vmitos despus de su tratamiento, le recomendamos que tome su medicamento para las nuseas segn las indicaciones.  LOS SNTOMAS QUE DEBEN COMUNICARSE INMEDIATAMENTE SE INDICAN A CONTINUACIN: *FIEBRE SUPERIOR A 100.4 F (38 C) O MS *ESCALOFROS O SUDORACIN *NUSEAS Y VMITOS QUE NO SE CONTROLAN CON EL MEDICAMENTO PARA LAS NUSEAS *DIFICULTAD INUSUAL PARA RESPIRAR  *MORETONES O HEMORRAGIAS NO HABITUALES *PROBLEMAS URINARIOS (dolor o ardor al Garment/textile technologist o frecuencia para Garment/textile technologist) *PROBLEMAS INTESTINALES (diarrea inusual, estreimiento, dolor cerca del ano) SENSIBILIDAD EN LA BOCA Y EN LA GARGANTA CON O SIN LA PRESENCIA DE LCERAS (dolor de garganta, llagas en la boca o dolor de muelas/dientes) ERUPCIN,  HINCHAZN O DOLORES INUSUALES FLUJO VAGINAL INUSUAL O PICAZN/RASQUIA    Los puntos marcados con un asterisco ( *) indican una posible emergencia y debe hacer un seguimiento tan pronto como le sea posible o vaya al Departamento de Emergencias si se le presenta algn problema.  Por favor, muestre la [place] DE ADVERTENCIA DE Windy Canny DE ADVERTENCIA DE Benay Spice al registrarse en [address] de Emergencias y a la enfermera de triaje.  Si tiene preguntas despus de su visita o necesita cancelar o volver a programar su cita, por favor pngase en contacto con [place]  Dept: [phone] y [place]. Las horas de oficina son de 8:00 a.m. a 4:30 p.m. de lunes a viernes. Por favor, tenga en cuenta que los mensajes de voz que se dejan despus de las 4:00 p.m. posiblemente no se devolvern hasta el siguiente da de [place].  Cerramos los fines de semana y [employer]. En todo momento tiene acceso a una enfermera para preguntas urgentes. Por favor, llame al nmero principal de la clnica Dept: [phone] y [place] instrucciones.   Para cualquier pregunta que no sea de carcter urgente, tambin puede ponerse en contacto con su proveedor [employer] Ahora ofrecemos visitas electrnicas para cualquier persona mayor de 18 aos que solicite atencin mdica en lnea para los sntomas que no sean urgentes. Para ms detalles vaya a [email or web address].   Tambin puede bajar la aplicacin de MyChart! Vaya a la tienda de aplicaciones, busque "MyChart", abra la aplicacin, seleccione  [place], e ingrese con su nombre de usuario y la contrasea de Pharmacist, community.  Las mscaras son opcionales en los centros de Hotel manager. Si desea que su equipo de cuidados mdicos use una [employer] atienden, por favor hgaselo saber al personal. Bethann Berkshire una persona de apoyo que tenga por lo menos 16 aos para que le acompae a sus  citas.

## 2022-01-06 NOTE — Progress Notes (Signed)
[place] NOTE  Patient Care Team: [name], MD as PCP - General (Hematology and Oncology) Rockwell Germany, RN as Oncology Nurse Navigator Mauro Kaufmann, RN as Oncology Nurse Navigator [name], MD as Consulting Physician (Hematology and Oncology) Jovita Kussmaul, MD as Consulting Physician (General Surgery) Kyung Rudd, MD as Consulting Physician (Radiation Oncology) [name], MD as Consulting Physician (Hematology and Oncology)  CHIEF COMPLAINTS/PURPOSE OF CONSULTATION:  Newly diagnosed breast cancer  HISTORY OF PRESENTING ILLNESS:  Hannah Murray 46 y.o. female is here because of recent diagnosis of right breast cancer  I reviewed her records extensively and collaborated the history with the patient.  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 Indeterminate mass in the right breast at 12:30, which may represent a fibroadenoma. No evidence of right axillary adenopathy.   05/16/2021 Pathology Results   Right breast 10 0 clock mass biopsy showed invasive mammary carcinoma, ductal phenotype prognostics include ER 95% strong staining intensity PR 95% strong staining intensity Ki-67 of 40% and HER2 negative   06/11/2021 Cancer Staging   Staging form: Breast, AJCC 8th Edition - Pathologic: Stage IIA (pT2, pN1a, cM0, G3, ER+, PR+, HER2-, Oncotype DX score: 24) - Signed by [name], MD on 11/04/2021 Multigene prognostic tests performed: Oncotype DX Recurrence score range: Greater than or equal to 11 Histologic grading system: 3 grade system   07/10/2021 Genetic Testing   Negative hereditary cancer genetic testing: no pathogenic variants detected [facility] Breast STAT Panel or Common Hereditary Cancers +RNA Panel.  Report  dates are Jul 10, 2021 and Jul 18, 2021.   The STAT Breast cancer panel offered by [facility] includes sequencing and rearrangement analysis for the following 9 genes:  ATM, BRCA1, BRCA2, CDH1, CHEK2, PALB2, PTEN, STK11 and TP53.   The Common Hereditary Cancers + RNA Panel offered by [facility] includes sequencing, deletion/duplication, and RNA testing of the following 47 genes: APC, ATM, AXIN2, BARD1, BMPR1A, BRCA1, BRCA2, BRIP1, CDH1, CDK4*, CDKN2A (p14ARF)*, CDKN2A (p16INK4a)*, CHEK2, CTNNA1, DICER1, EPCAM (Deletion/duplication testing only), GREM1 (promoter region deletion/duplication testing only), KIT, MEN1, MLH1, MSH2, MSH3, MSH6, MUTYH, NBN, NF1, NHTL1, PALB2, PDGFRA*, PMS2, POLD1, POLE, PTEN, RAD50, RAD51C, RAD51D, SDHB, SDHC, SDHD, SMAD4, SMARCA4. STK11, TP53, TSC1, TSC2, and VHL.  The following genes were evaluated for sequence changes only: SDHA and HOXB13 c.251G>A variant only.  RNA analysis is not performed for the * genes.     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  Right axillary lymph node evaluation showed 1 out of 8 lymph nodes positive for malignancy., final pathologic staging T2N1A.    11/11/2021 -  Chemotherapy   Patient is on Treatment Plan : BREAST ADJUVANT DOSE DENSE AC q14d / PACLitaxel J0D      A certified Patent attorney used for the conversation.   She continues to do really well so far with chemo. She denies any complaints except for mild fatigue. No nausea, vomiting and diarrhea.  Rest of the pertinent 10 point ROS reviewed and negative  SURGICAL HISTORY: Past Surgical History:  Procedure Laterality Date   BREAST RECONSTRUCTION WITH PLACEMENT OF  TISSUE EXPANDER AND FLEX HD (ACELLULAR HYDRATED DERMIS) Right 09/30/2021   Procedure: BREAST RECONSTRUCTION WITH PLACEMENT OF TISSUE EXPANDER AND  FLEX HD (ACELLULAR HYDRATED DERMIS);  Surgeon: Wallace Going, DO;  Location: [place];  Service: Plastics;  Laterality: Right;   MASTECTOMY W/ SENTINEL NODE BIOPSY Right 09/30/2021   Procedure: RIGHT MASTECTOMY WITH SENTINEL LYMPH NODE BIOPSY;  Surgeon: Jovita Kussmaul, MD;  Location: [place];  Service: General;  Laterality: Right;   PORTACATH PLACEMENT Left 11/08/2021   Procedure: INSERTION PORT-A-CATH;  Surgeon: Jovita Kussmaul, MD;  Location: [place];  Service: General;  Laterality: Left;    SOCIAL HISTORY: Social History   Socioeconomic History   Marital status: Married    Spouse name: Not on file   Number of children: 1   Years of education: Not on file   Highest education level: High school graduate  Occupational History   Not on file  Tobacco Use   Smoking status: Never   Smokeless tobacco: Never  Vaping Use   Vaping Use: Never used  Substance and Sexual Activity   Alcohol use: Yes    Comment: occasionally   Drug use: Never   Sexual activity: Not Currently  Other Topics Concern   Not on file  Social History Narrative   Not on file   Social Determinants of Health   Financial Resource Strain: Not on file  Food Insecurity: No Food Insecurity (04/30/2021)   Hunger Vital Sign    Worried About Running Out of Food in the Last Year: Never true    Ran Out of Food in the Last Year: Never true  Transportation Needs: No Transportation Needs (04/30/2021)   PRAPARE - Hydrologist (Medical): No    Lack of Transportation (Non-Medical): No  Physical Activity: Not on file  Stress: Not on file  Social Connections: Not on file  Intimate Partner Violence: Not At Risk (07/02/2021)   Humiliation, Afraid, Rape, and Kick questionnaire    Fear of Current or Ex-Partner: No    Emotionally Abused: No    Physically Abused: No    Sexually Abused: No    FAMILY HISTORY: Family History  Problem Relation Age  of Onset   Hypertension Father    Diabetes Father    Thyroid disease Father    Ovarian cancer Other        MGM's niece; d. > 26    ALLERGIES:  has No Known Allergies.  MEDICATIONS:  Current Outpatient Medications  Medication Sig Dispense Refill   dexamethasone (DECADRON) 4 MG tablet Take 2 tablets (8 mg total) by mouth daily for 3 days. Start the day after doxorubicin/cyclophosphamide chemotherapy. Take with food. 30 tablet 1   diphenhydrAMINE (BENADRYL) 25 MG tablet Take 25 mg by mouth every 6 (six) hours as needed for itching.     lidocaine-prilocaine (EMLA) cream Apply to affected area once (Patient taking differently: Apply 1 Application topically as needed (access port).) 30 g 3   ondansetron (ZOFRAN) 8 MG tablet Take 1 tab (8 mg) by mouth every 8 hrs as needed for nausea/vomiting. Start third day after doxorubicin/cyclophosphamide chemotherapy. 30 tablet 1   oxyCODONE (ROXICODONE) 5 MG immediate release tablet Take 1 tablet (5 mg total) by mouth every 6 (six) hours as needed for severe pain. 10 tablet 0   prochlorperazine (COMPAZINE) 10 MG tablet Take 1 tablet (10 mg total) by mouth every 6 (six) hours as needed for nausea or  vomiting. 30 tablet 1   zolpidem (AMBIEN) 5 MG tablet Take 1 tablet (5 mg total) by mouth at bedtime as needed for sleep. 30 tablet 0   No current facility-administered medications for this visit.    REVIEW OF SYSTEMS:   Constitutional: Denies fevers, chills or abnormal night sweats Eyes: Denies blurriness of vision, double vision or watery eyes Ears, nose, mouth, throat, and face: Denies mucositis or sore throat Respiratory: Denies cough, dyspnea or wheezes Cardiovascular: Denies palpitation, chest discomfort or lower extremity swelling Gastrointestinal:  Denies nausea, heartburn or change in bowel habits Skin: Denies abnormal skin rashes Lymphatics: Denies new lymphadenopathy or easy bruising Neurological:Denies numbness, tingling or new  weaknesses Behavioral/Psych: Mood is stable, no new changes  Breast: Denies any palpable lumps or discharge All other systems were reviewed with the patient and are negative.  PHYSICAL EXAMINATION: ECOG PERFORMANCE STATUS: 0 - Asymptomatic  Vitals:   01/06/22 1247  BP: 139/84  Pulse: 96  Resp: 16  Temp: 97.7 F (36.5 C)  SpO2: 99%   Filed Weights   01/06/22 1247  Weight: 143 lb 11.2 oz (65.2 kg)    Physical Exam Constitutional:      Appearance: Normal appearance.  Cardiovascular:     Rate and Rhythm: Normal rate and regular rhythm.  Abdominal:     General: Abdomen is flat. There is no distension.     Tenderness: There is no abdominal tenderness.  Musculoskeletal:        General: No swelling. Normal range of motion.     Cervical back: Normal range of motion and neck supple. No rigidity.  Lymphadenopathy:     Cervical: No cervical adenopathy.  Skin:    General: Skin is warm and dry.     Comments: Please see photos in media.  She has scattered waxy papules with central dip.  Mild erythema surrounding the lesion. These lesions has now near resolved, they appear like poison ivy rash almost resolving  Neurological:     Mental Status: She is alert.      LABORATORY DATA:  I have reviewed the data as listed Lab Results  Component Value Date   WBC 13.2 (H) 01/06/2022   HGB 11.5 (L) 01/06/2022   HCT 35.4 (L) 01/06/2022   MCV 87.4 01/06/2022   PLT 192 01/06/2022   Lab Results  Component Value Date   NA 138 01/06/2022   K 3.8 01/06/2022   CL 106 01/06/2022   CO2 27 01/06/2022    RADIOGRAPHIC STUDIES: I have personally reviewed the radiological reports and agreed with the findings in the report.  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  Since last visit she had mastectomy which showed grade 3 invasive ductal carcinoma measuring 4.3 cm in greatest dimension, margins negative along with solid type DCIS high-grade.  Right axillary lymph node evaluation showed 1 out of 8 lymph nodes positive for malignancy.  Oncotype test resulted at 24, benefit of chemotherapy cannot be excluded. During her last visit, I recommended, given young age, high-grade, Oncotype of 24 and positive lymph node involvement.   She is now on adj AC-T, completed AC * 4 cycles. Today she will be starting adj taxol. I once again discussed adverse effects of taxol including but not limited to fatigue, skin  rash, allergy reactions and neuropathy. She will keep [place] posted of any neuropathy  She will be candidate for adj abema and anti estrogen therapy after completion of chemo and radiation if applicable.    Total time spent: 30 minutes including history, review of records, counseling and coordination of care All questions were answered. The patient knows to call the clinic with any problems, questions or concerns.    [name], MD 01/08/22

## 2022-01-07 ENCOUNTER — Encounter: Payer: Self-pay | Admitting: Hematology and Oncology

## 2022-01-07 ENCOUNTER — Other Ambulatory Visit: Payer: Self-pay

## 2022-01-07 NOTE — Progress Notes (Signed)
Patient brought in documentation for [place] in my absence on 11/14.  [name] approval paperwork will be given to her at next visit to complete. Hannah Murray details will be discussed via interpreter.

## 2022-01-08 ENCOUNTER — Encounter: Payer: Self-pay | Admitting: Hematology and Oncology

## 2022-01-08 NOTE — Assessment & Plan Note (Signed)
This is a 46 year old female patient with newly diagnosed right breast IDC, ER/PR positive, HER2 negative, clinically stage T2 N0 M0 grade 2/3 referred to medical oncology for recommendations.  Since last visit she had mastectomy which showed grade 3 invasive ductal carcinoma measuring 4.3 cm in greatest dimension, margins negative along with solid type DCIS high-grade.  Right axillary lymph node evaluation showed 1 out of 8 lymph nodes positive for malignancy.  Oncotype test resulted at 24, benefit of chemotherapy cannot be excluded. During her last visit, I recommended, given young age, high-grade, Oncotype of 24 and positive lymph node involvement.   She is now on adj AC-T, completed AC * 4 cycles. Today she will be starting adj taxol. I once again discussed adverse effects of taxol including but not limited to fatigue, skin rash, allergy reactions and neuropathy. She will keep [place] posted of any neuropathy  She will be candidate for adj abema and anti estrogen therapy after completion of chemo and radiation if applicable.

## 2022-01-10 ENCOUNTER — Ambulatory Visit (INDEPENDENT_AMBULATORY_CARE_PROVIDER_SITE_OTHER): Payer: Self-pay | Admitting: Physician Assistant

## 2022-01-10 ENCOUNTER — Encounter: Payer: Self-pay | Admitting: Physician Assistant

## 2022-01-10 ENCOUNTER — Encounter: Payer: Self-pay | Admitting: *Deleted

## 2022-01-10 VITALS — BP 125/99 | HR 85

## 2022-01-10 DIAGNOSIS — C50411 Malignant neoplasm of upper-outer quadrant of right female breast: Secondary | ICD-10-CM

## 2022-01-10 MED FILL — Dexamethasone Sodium Phosphate Inj 100 MG/10ML: INTRAMUSCULAR | Qty: 1 | Status: AC

## 2022-01-10 NOTE — Progress Notes (Signed)
This is a 46 year old female seen in our office for follow-up status post right mastectomy on 09/30/2021 with expander placement by Dr. Marla Roe.  She was last seen in the office on 12/18/2021.    She has had a total of 400 out of 535 cc in the right expander.  Since her last office visit she denies any significant complaints or concerns, she denies any infectious signs or symptoms.  She is currently on chemotherapy and will continue on this for approximately 4 months in total duration ending likely late in January 2024.  Chaperone present.  On exam right breast with expander in place, no overlying redness or signs of swelling, infection.  Incision is clean dry and intact with routine healing.  Wound no warmth to touch, no palpable fluid collections or areas of fluctuance or firmness.  Mastectomy flaps are viable with no necrosis.  Dr. Marla Roe had the opportunity to evaluate the patient.  At this time we will not provide any further refills.  We would like the patient to reach out to [place] in January as she comes closer to ending chemotherapy and we may plan for expander to implant.  She is given strict return precautions, she verbalized understanding and agreement to this plan had no further questions or concerns.

## 2022-01-13 ENCOUNTER — Inpatient Hospital Stay: Payer: No Typology Code available for payment source

## 2022-01-13 ENCOUNTER — Other Ambulatory Visit: Payer: Self-pay

## 2022-01-13 VITALS — BP 155/78 | HR 97 | Temp 98.8°F | Resp 18

## 2022-01-13 DIAGNOSIS — C50411 Malignant neoplasm of upper-outer quadrant of right female breast: Secondary | ICD-10-CM

## 2022-01-13 DIAGNOSIS — Z95828 Presence of other vascular implants and grafts: Secondary | ICD-10-CM

## 2022-01-13 LAB — CMP (CANCER CENTER ONLY)
ALT: 14 U/L (ref 0–44)
AST: 15 U/L (ref 15–41)
Albumin: 4.3 g/dL (ref 3.5–5.0)
Alkaline Phosphatase: 55 U/L (ref 38–126)
Anion gap: 6 (ref 5–15)
BUN: 11 mg/dL (ref 6–20)
CO2: 26 mmol/L (ref 22–32)
Calcium: 9.1 mg/dL (ref 8.9–10.3)
Chloride: 103 mmol/L (ref 98–111)
Creatinine: 0.77 mg/dL (ref 0.44–1.00)
GFR, Estimated: >60 mL/min (ref >60)
Glucose, Bld: 106 mg/dL — ABNORMAL HIGH (ref 70–99)
Potassium: 3.6 mmol/L (ref 3.5–5.1)
Sodium: 135 mmol/L (ref 135–145)
Total Bilirubin: 0.4 mg/dL (ref 0.3–1.2)
Total Protein: 7.2 g/dL (ref 6.5–8.1)

## 2022-01-13 LAB — CBC WITH DIFFERENTIAL (CANCER CENTER ONLY)
Abs Immature Granulocytes: 0.05 10*3/uL (ref 0.00–0.07)
Basophils Absolute: 0.1 10*3/uL (ref 0.0–0.1)
Basophils Relative: 1 %
Eosinophils Absolute: 0 10*3/uL (ref 0.0–0.5)
Eosinophils Relative: 1 %
HCT: 31.4 % — ABNORMAL LOW (ref 36.0–46.0)
Hemoglobin: 10.4 g/dL — ABNORMAL LOW (ref 12.0–15.0)
Immature Granulocytes: 1 %
Lymphocytes Relative: 14 %
Lymphs Abs: 1 10*3/uL (ref 0.7–4.0)
MCH: 28.9 pg (ref 26.0–34.0)
MCHC: 33.1 g/dL (ref 30.0–36.0)
MCV: 87.2 fL (ref 80.0–100.0)
Monocytes Absolute: 0.9 10*3/uL (ref 0.1–1.0)
Monocytes Relative: 12 %
Neutro Abs: 5.1 10*3/uL (ref 1.7–7.7)
Neutrophils Relative %: 71 %
Platelet Count: 305 10*3/uL (ref 150–400)
RBC: 3.6 MIL/uL — ABNORMAL LOW (ref 3.87–5.11)
RDW: 13.7 % (ref 11.5–15.5)
WBC Count: 7.1 10*3/uL (ref 4.0–10.5)
nRBC: 0 % (ref 0.0–0.2)

## 2022-01-13 MED ORDER — CETIRIZINE HCL 10 MG/ML IV SOLN
10.0000 mg | Freq: Once | INTRAVENOUS | Status: AC
  Administered 2022-01-13: 10 mg via INTRAVENOUS
  Filled 2022-01-13: qty 1

## 2022-01-13 MED ORDER — SODIUM CHLORIDE 0.9 % IV SOLN
80.0000 mg/m2 | Freq: Once | INTRAVENOUS | Status: AC
  Administered 2022-01-13: 138 mg via INTRAVENOUS
  Filled 2022-01-13: qty 23

## 2022-01-13 MED ORDER — SODIUM CHLORIDE 0.9% FLUSH
10.0000 mL | INTRAVENOUS | Status: AC | PRN
  Administered 2022-01-13: 10 mL

## 2022-01-13 MED ORDER — SODIUM CHLORIDE 0.9 % IV SOLN
10.0000 mg | Freq: Once | INTRAVENOUS | Status: AC
  Administered 2022-01-13: 10 mg via INTRAVENOUS
  Filled 2022-01-13: qty 10

## 2022-01-13 MED ORDER — FAMOTIDINE IN NACL 20-0.9 MG/50ML-% IV SOLN
20.0000 mg | Freq: Once | INTRAVENOUS | Status: AC
  Administered 2022-01-13: 20 mg via INTRAVENOUS
  Filled 2022-01-13: qty 50

## 2022-01-13 MED ORDER — SODIUM CHLORIDE 0.9 % IV SOLN: Freq: Once | INTRAVENOUS | Status: AC

## 2022-01-13 MED ORDER — SODIUM CHLORIDE 0.9% FLUSH
10.0000 mL | INTRAVENOUS | Status: DC | PRN
  Administered 2022-01-13: 10 mL

## 2022-01-13 MED ORDER — HEPARIN SOD (PORK) LOCK FLUSH 100 UNIT/ML IV SOLN
500.0000 [IU] | Freq: Once | INTRAVENOUS | Status: AC | PRN
  Administered 2022-01-13: 500 [IU]

## 2022-01-13 NOTE — Patient Instructions (Signed)
Instrucciones al darle de alta: Discharge Instructions Gracias por elegir al [facility] de Cncer de [place] para brindarle atencin mdica de oncologa y Music therapist.   Si usted tiene una cita de laboratorio con [place], por favor vaya directamente [place] y regstrese en el rea de Control and instrumentation engineer.   Use ropa cmoda y [place] para tener fcil acceso a las vas del Portacath (acceso venoso de Engineer, site duracin) o la lnea PICC (catter central colocado por va perifrica).   Nos esforzamos por ofrecerle tiempo de calidad con su proveedor. Es posible que tenga que volver a programar su cita si llega tarde (15 minutos o ms).  El llegar tarde le afecta a usted y a otros pacientes cuyas citas son posteriores a Merchandiser, retail.  Adems, si usted falta a tres o ms citas sin avisar a la oficina, puede ser retirado(a) de la clnica a discrecin del proveedor.      Para las solicitudes de renovacin de recetas, pida a su farmacia que se ponga en contacto con nuestra oficina y deje que transcurran 10 horas para que se complete el proceso de las renovaciones.    Hoy usted recibi los siguientes agentes de quimioterapia e/o inmunoterapia: Paclitaxel (Taxol)       Para ayudar a prevenir las nuseas y los vmitos despus de su tratamiento, le recomendamos que tome su medicamento para las nuseas segn las indicaciones.  LOS SNTOMAS QUE DEBEN COMUNICARSE INMEDIATAMENTE SE INDICAN A CONTINUACIN: *FIEBRE SUPERIOR A 100.4 F (38 C) O MS *ESCALOFROS O SUDORACIN *NUSEAS Y VMITOS QUE NO SE CONTROLAN CON EL MEDICAMENTO PARA LAS NUSEAS *DIFICULTAD INUSUAL PARA RESPIRAR  *MORETONES O HEMORRAGIAS NO HABITUALES *PROBLEMAS URINARIOS (dolor o ardor al Garment/textile technologist o frecuencia para Garment/textile technologist) *PROBLEMAS INTESTINALES (diarrea inusual, estreimiento, dolor cerca del ano) SENSIBILIDAD EN LA BOCA Y EN LA GARGANTA CON O SIN LA PRESENCIA DE LCERAS (dolor de garganta, llagas en la boca o dolor de  muelas/dientes) ERUPCIN, HINCHAZN O DOLORES INUSUALES FLUJO VAGINAL INUSUAL O PICAZN/RASQUIA    Los puntos marcados con un asterisco ( *) indican una posible emergencia y debe hacer un seguimiento tan pronto como le sea posible o vaya al Departamento de Emergencias si se le presenta algn problema.  Por favor, muestre la [place] DE ADVERTENCIA DE Windy Canny DE ADVERTENCIA DE Benay Spice al registrarse en [address] de Emergencias y a la enfermera de triaje.  Si tiene preguntas despus de su visita o necesita cancelar o volver a programar su cita, por favor pngase en contacto con [place]  Dept: [phone] y [place]. Las horas de oficina son de 8:00 a.m. a 4:30 p.m. de lunes a viernes. Por favor, tenga en cuenta que los mensajes de voz que se dejan despus de las 4:00 p.m. posiblemente no se devolvern hasta el siguiente da de [place].  Cerramos los fines de semana y [employer]. En todo momento tiene acceso a una enfermera para preguntas urgentes. Por favor, llame al nmero principal de la clnica Dept: [phone] y [place] instrucciones.   Para cualquier pregunta que no sea de carcter urgente, tambin puede ponerse en contacto con su proveedor [employer] Ahora ofrecemos visitas electrnicas para cualquier persona mayor de 18 aos que solicite atencin mdica en lnea para los sntomas que no sean urgentes. Para ms detalles vaya a [email or web address].   Tambin puede bajar la aplicacin de MyChart! Vaya a la tienda de aplicaciones, busque "MyChart", abra la  aplicacin, seleccione [place], e ingrese con su nombre de usuario y la contrasea de Pharmacist, community.  Las mscaras son opcionales en los centros de Hotel manager. Si desea que su equipo de cuidados mdicos use una [employer] atienden, por favor hgaselo saber al personal. Bethann Berkshire una persona de apoyo que tenga por lo menos 16 aos  para que le acompae a sus citas.

## 2022-01-15 ENCOUNTER — Telehealth: Payer: Self-pay | Admitting: *Deleted

## 2022-01-15 MED ORDER — AMOXICILLIN-POT CLAVULANATE 500-125 MG PO TABS: 1.0000 | ORAL_TABLET | Freq: Three times a day (TID) | ORAL | 0 refills | Status: DC

## 2022-01-15 NOTE — Telephone Encounter (Signed)
Per MD review- prescription obtained for antibiotic and recommendation for use of cough medication OTC - Robitussin DM.  This RN contacted [facility] per above and sent to verified pharmacy.

## 2022-01-15 NOTE — Telephone Encounter (Signed)
This RN was contacted by [facility]my[facility] Free - spanish speaking liaison- stating pt has had a cough over the past 2 weeks now with runny nose - with green mucous.  Pt does not have a fever.  Pt had cycle 2 of weekly taxol on 11/20 post completing 4 cycles of A/C.  Please advise if pt needs antibiotic or just use OTC medications.

## 2022-01-17 MED FILL — Dexamethasone Sodium Phosphate Inj 100 MG/10ML: INTRAMUSCULAR | Qty: 1 | Status: AC

## 2022-01-20 ENCOUNTER — Inpatient Hospital Stay: Payer: No Typology Code available for payment source

## 2022-01-20 ENCOUNTER — Other Ambulatory Visit: Payer: Self-pay

## 2022-01-20 ENCOUNTER — Inpatient Hospital Stay (HOSPITAL_BASED_OUTPATIENT_CLINIC_OR_DEPARTMENT_OTHER): Payer: No Typology Code available for payment source | Admitting: Hematology and Oncology

## 2022-01-20 VITALS — BP 125/65 | HR 93 | Temp 97.8°F | Resp 16 | Ht 65.0 in | Wt 146.0 lb

## 2022-01-20 DIAGNOSIS — Z95828 Presence of other vascular implants and grafts: Secondary | ICD-10-CM

## 2022-01-20 DIAGNOSIS — C50411 Malignant neoplasm of upper-outer quadrant of right female breast: Secondary | ICD-10-CM

## 2022-01-20 LAB — CMP (CANCER CENTER ONLY)
ALT: 17 U/L (ref 0–44)
AST: 18 U/L (ref 15–41)
Albumin: 4.1 g/dL (ref 3.5–5.0)
Alkaline Phosphatase: 61 U/L (ref 38–126)
Anion gap: 6 (ref 5–15)
BUN: 11 mg/dL (ref 6–20)
CO2: 28 mmol/L (ref 22–32)
Calcium: 9.2 mg/dL (ref 8.9–10.3)
Chloride: 106 mmol/L (ref 98–111)
Creatinine: 0.77 mg/dL (ref 0.44–1.00)
GFR, Estimated: >60 mL/min (ref >60)
Glucose, Bld: 118 mg/dL — ABNORMAL HIGH (ref 70–99)
Potassium: 3.8 mmol/L (ref 3.5–5.1)
Sodium: 140 mmol/L (ref 135–145)
Total Bilirubin: 0.3 mg/dL (ref 0.3–1.2)
Total Protein: 7.1 g/dL (ref 6.5–8.1)

## 2022-01-20 LAB — CBC WITH DIFFERENTIAL (CANCER CENTER ONLY)
Abs Immature Granulocytes: 0.12 10*3/uL — ABNORMAL HIGH (ref 0.00–0.07)
Basophils Absolute: 0 10*3/uL (ref 0.0–0.1)
Basophils Relative: 1 %
Eosinophils Absolute: 0.2 10*3/uL (ref 0.0–0.5)
Eosinophils Relative: 3 %
HCT: 31.1 % — ABNORMAL LOW (ref 36.0–46.0)
Hemoglobin: 10.3 g/dL — ABNORMAL LOW (ref 12.0–15.0)
Immature Granulocytes: 2 %
Lymphocytes Relative: 21 %
Lymphs Abs: 1.2 10*3/uL (ref 0.7–4.0)
MCH: 28.2 pg (ref 26.0–34.0)
MCHC: 33.1 g/dL (ref 30.0–36.0)
MCV: 85.2 fL (ref 80.0–100.0)
Monocytes Absolute: 0.7 10*3/uL (ref 0.1–1.0)
Monocytes Relative: 12 %
Neutro Abs: 3.6 10*3/uL (ref 1.7–7.7)
Neutrophils Relative %: 61 %
Platelet Count: 387 10*3/uL (ref 150–400)
RBC: 3.65 MIL/uL — ABNORMAL LOW (ref 3.87–5.11)
RDW: 13.6 % (ref 11.5–15.5)
WBC Count: 5.9 10*3/uL (ref 4.0–10.5)
nRBC: 0 % (ref 0.0–0.2)

## 2022-01-20 MED ORDER — CETIRIZINE HCL 10 MG/ML IV SOLN
10.0000 mg | Freq: Once | INTRAVENOUS | Status: AC
  Administered 2022-01-20: 10 mg via INTRAVENOUS
  Filled 2022-01-20: qty 1

## 2022-01-20 MED ORDER — SODIUM CHLORIDE 0.9 % IV SOLN: Freq: Once | INTRAVENOUS | Status: AC

## 2022-01-20 MED ORDER — SODIUM CHLORIDE 0.9 % IV SOLN
10.0000 mg | Freq: Once | INTRAVENOUS | Status: AC
  Administered 2022-01-20: 10 mg via INTRAVENOUS
  Filled 2022-01-20: qty 10

## 2022-01-20 MED ORDER — SODIUM CHLORIDE 0.9% FLUSH
10.0000 mL | INTRAVENOUS | Status: DC | PRN
  Administered 2022-01-20: 10 mL

## 2022-01-20 MED ORDER — FAMOTIDINE IN NACL 20-0.9 MG/50ML-% IV SOLN
20.0000 mg | Freq: Once | INTRAVENOUS | Status: AC
  Administered 2022-01-20: 20 mg via INTRAVENOUS
  Filled 2022-01-20: qty 50

## 2022-01-20 MED ORDER — SODIUM CHLORIDE 0.9 % IV SOLN
80.0000 mg/m2 | Freq: Once | INTRAVENOUS | Status: AC
  Administered 2022-01-20: 138 mg via INTRAVENOUS
  Filled 2022-01-20: qty 23

## 2022-01-20 MED ORDER — SODIUM CHLORIDE 0.9% FLUSH
10.0000 mL | INTRAVENOUS | Status: AC | PRN
  Administered 2022-01-20: 10 mL

## 2022-01-20 MED ORDER — HEPARIN SOD (PORK) LOCK FLUSH 100 UNIT/ML IV SOLN
500.0000 [IU] | Freq: Once | INTRAVENOUS | Status: AC | PRN
  Administered 2022-01-20: 500 [IU]

## 2022-01-20 NOTE — Progress Notes (Unsigned)
[place] NOTE  Patient Care Team: [name], MD as PCP - General (Hematology and Oncology) Rockwell Germany, RN as Oncology Nurse Navigator Mauro Kaufmann, RN as Oncology Nurse Navigator [name], MD as Consulting Physician (Hematology and Oncology) Jovita Kussmaul, MD as Consulting Physician (General Surgery) Kyung Rudd, MD as Consulting Physician (Radiation Oncology) [name], MD as Consulting Physician (Hematology and Oncology)  CHIEF COMPLAINTS/PURPOSE OF CONSULTATION:  Newly diagnosed breast cancer  HISTORY OF PRESENTING ILLNESS:  Hannah Murray 46 y.o. female is here because of recent diagnosis of right breast cancer  I reviewed her records extensively and collaborated the history with the patient.  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 Indeterminate mass in the right breast at 12:30, which may represent a fibroadenoma. No evidence of right axillary adenopathy.   05/16/2021 Pathology Results   Right breast 10 0 clock mass biopsy showed invasive mammary carcinoma, ductal phenotype prognostics include ER 95% strong staining intensity PR 95% strong staining intensity Ki-67 of 40% and HER2 negative   06/11/2021 Cancer Staging   Staging form: Breast, AJCC 8th Edition - Pathologic: Stage IIA (pT2, pN1a, cM0, G3, ER+, PR+, HER2-, Oncotype DX score: 24) - Signed by [name], MD on 11/04/2021 Multigene prognostic tests performed: Oncotype DX Recurrence score range: Greater than or equal to 11 Histologic grading system: 3 grade system   07/10/2021 Genetic Testing   Negative hereditary cancer genetic testing: no pathogenic variants detected [facility] Breast STAT Panel or Common Hereditary Cancers +RNA Panel.  Report  dates are Jul 10, 2021 and Jul 18, 2021.   The STAT Breast cancer panel offered by [facility] includes sequencing and rearrangement analysis for the following 9 genes:  ATM, BRCA1, BRCA2, CDH1, CHEK2, PALB2, PTEN, STK11 and TP53.   The Common Hereditary Cancers + RNA Panel offered by [facility] includes sequencing, deletion/duplication, and RNA testing of the following 47 genes: APC, ATM, AXIN2, BARD1, BMPR1A, BRCA1, BRCA2, BRIP1, CDH1, CDK4*, CDKN2A (p14ARF)*, CDKN2A (p16INK4a)*, CHEK2, CTNNA1, DICER1, EPCAM (Deletion/duplication testing only), GREM1 (promoter region deletion/duplication testing only), KIT, MEN1, MLH1, MSH2, MSH3, MSH6, MUTYH, NBN, NF1, NHTL1, PALB2, PDGFRA*, PMS2, POLD1, POLE, PTEN, RAD50, RAD51C, RAD51D, SDHB, SDHC, SDHD, SMAD4, SMARCA4. STK11, TP53, TSC1, TSC2, and VHL.  The following genes were evaluated for sequence changes only: SDHA and HOXB13 c.251G>A variant only.  RNA analysis is not performed for the * genes.     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  Right axillary lymph node evaluation showed 1 out of 8 lymph nodes positive for malignancy., final pathologic staging T2N1A.    11/11/2021 -  Chemotherapy   Patient is on Treatment Plan : BREAST ADJUVANT DOSE DENSE AC q14d / PACLitaxel W7P      A certified Patent attorney used for the conversation.    Ms. Hannah Murray has been feeling really well since her last visit except for some sore throat.  We have prescribed her some Augmentin which has been helping tremendously.  She denies any fevers or chills.  She has noted some darkening of her nails.  No nausea, vomiting or diarrhea.  No neuropathy reported. Rest of the pertinent 10 point ROS reviewed and negative  SURGICAL HISTORY: Past Surgical History:  Procedure Laterality Date   BREAST RECONSTRUCTION  WITH PLACEMENT OF TISSUE EXPANDER AND FLEX HD (ACELLULAR HYDRATED DERMIS) Right 09/30/2021   Procedure: BREAST RECONSTRUCTION WITH PLACEMENT OF TISSUE EXPANDER AND FLEX HD (ACELLULAR HYDRATED DERMIS);  Surgeon: Dillingham, Claire S, DO;  Location: [facility];  Service: Plastics;  Laterality: Right;   MASTECTOMY W/ SENTINEL NODE BIOPSY Right 09/30/2021   Procedure: RIGHT MASTECTOMY WITH SENTINEL LYMPH NODE BIOPSY;  Surgeon: Toth, Paul III, MD;  Location: [facility];  Service: General;  Laterality: Right;   PORTACATH PLACEMENT Left 11/08/2021   Procedure: INSERTION PORT-A-CATH;  Surgeon: Toth, Paul III, MD;  Location: [facility];  Service: General;  Laterality: Left;    SOCIAL HISTORY: Social History   Socioeconomic History   Marital status: Married    Spouse name: Not on file   Number of children: 1   Years of education: Not on file   Highest education level: High school graduate  Occupational History   Not on file  Tobacco Use   Smoking status: Never   Smokeless tobacco: Never  Vaping Use   Vaping Use: Never used  Substance and Sexual Activity   Alcohol use: Yes    Comment: occasionally   Drug use: Never   Sexual activity: Not Currently  Other Topics Concern   Not on file  Social History Narrative   Not on file   Social Determinants of Health   Financial Resource Strain: Not on file  Food Insecurity: No Food Insecurity (04/30/2021)   Hunger Vital Sign    Worried About Running Out of Food in the Last Year: Never true    Ran Out of Food in the Last Year: Never true  Transportation Needs: No Transportation Needs (04/30/2021)   PRAPARE - Transportation    Lack of Transportation (Medical): No    Lack of Transportation (Non-Medical): No  Physical Activity: Not on file  Stress: Not on file  Social Connections: Not on file  Intimate Partner Violence: Not At Risk (07/02/2021)   Humiliation, Afraid, Rape, and Kick questionnaire    Fear of  Current or Ex-Partner: No    Emotionally Abused: No    Physically Abused: No    Sexually Abused: No    FAMILY HISTORY: Family History  Problem Relation Age of Onset   Hypertension Father    Diabetes Father    Thyroid disease Father    Ovarian cancer Other        MGM's niece; d. > 50    ALLERGIES:  has No Known Allergies.  MEDICATIONS:  Current Outpatient Medications  Medication Sig Dispense Refill   amoxicillin-clavulanate (AUGMENTIN) 500-125 MG tablet Take 1 tablet by mouth 3 (three) times daily. 21 tablet 0   dexamethasone (DECADRON) 4 MG tablet Take 2 tablets (8 mg total) by mouth daily for 3 days. Start the day after doxorubicin/cyclophosphamide chemotherapy. Take with food. (Patient not taking: Reported on 01/10/2022) 30 tablet 1   diphenhydrAMINE (BENADRYL) 25 MG tablet Take 25 mg by mouth every 6 (six) hours as needed for itching. (Patient not taking: Reported on 01/10/2022)     lidocaine-prilocaine (EMLA) cream Apply to affected area once (Patient not taking: Reported on 01/10/2022) 30 g 3   ondansetron (ZOFRAN) 8 MG tablet Take 1 tab (8 mg) by mouth every 8 hrs as needed for nausea/vomiting. Start third day after doxorubicin/cyclophosphamide chemotherapy. (  Patient not taking: Reported on 01/10/2022) 30 tablet 1   oxyCODONE (ROXICODONE) 5 MG immediate release tablet Take 1 tablet (5 mg total) by mouth every 6 (six) hours as needed for severe pain. (Patient not taking: Reported on 01/10/2022) 10 tablet 0   prochlorperazine (COMPAZINE) 10 MG tablet Take 1 tablet (10 mg total) by mouth every 6 (six) hours as needed for nausea or vomiting. (Patient not taking: Reported on 01/10/2022) 30 tablet 1   zolpidem (AMBIEN) 5 MG tablet Take 1 tablet (5 mg total) by mouth at bedtime as needed for sleep. (Patient not taking: Reported on 01/10/2022) 30 tablet 0   No current facility-administered medications for this visit.   Facility-Administered Medications Ordered in Other Visits   Medication Dose Route Frequency Provider Last Rate Last Admin   cetirizine (QUZYTTIR) injection 10 mg  10 mg Intravenous Once Iruku, Praveena, MD       dexamethasone (DECADRON) 10 mg in sodium chloride 0.9 % 50 mL IVPB  10 mg Intravenous Once Iruku, Praveena, MD       famotidine (PEPCID) IVPB 20 mg premix  20 mg Intravenous Once Iruku, Praveena, MD       heparin lock flush 100 unit/mL  500 Units Intracatheter Once PRN Iruku, Praveena, MD       PACLitaxel (TAXOL) 138 mg in sodium chloride 0.9 % 250 mL chemo infusion (</= 80mg/m2)  80 mg/m2 (Treatment Plan Recorded) Intravenous Once Iruku, Praveena, MD       sodium chloride flush (NS) 0.9 % injection 10 mL  10 mL Intracatheter PRN Iruku, Praveena, MD        REVIEW OF SYSTEMS:   Constitutional: Denies fevers, chills or abnormal night sweats Eyes: Denies blurriness of vision, double vision or watery eyes Ears, nose, mouth, throat, and face: Denies mucositis or sore throat Respiratory: Denies cough, dyspnea or wheezes Cardiovascular: Denies palpitation, chest discomfort or lower extremity swelling Gastrointestinal:  Denies nausea, heartburn or change in bowel habits Skin: Denies abnormal skin rashes Lymphatics: Denies new lymphadenopathy or easy bruising Neurological:Denies numbness, tingling or new weaknesses Behavioral/Psych: Mood is stable, no new changes  Breast: Denies any palpable lumps or discharge All other systems were reviewed with the patient and are negative.  PHYSICAL EXAMINATION: ECOG PERFORMANCE STATUS: 0 - Asymptomatic  Vitals:   01/20/22 1413  BP: 125/65  Pulse: 93  Resp: 16  Temp: 97.8 F (36.6 C)  SpO2: 99%   Filed Weights   01/20/22 1413  Weight: 146 lb (66.2 kg)    Physical Exam Constitutional:      Appearance: Normal appearance.  Cardiovascular:     Rate and Rhythm: Normal rate and regular rhythm.  Abdominal:     General: Abdomen is flat. There is no distension.     Tenderness: There is no abdominal  tenderness.  Musculoskeletal:        General: No swelling. Normal range of motion.     Cervical back: Normal range of motion and neck supple. No rigidity.  Lymphadenopathy:     Cervical: No cervical adenopathy.  Skin:    General: Skin is warm and dry.  Neurological:     Mental Status: She is alert.      LABORATORY DATA:  I have reviewed the data as listed Lab Results  Component Value Date   WBC 5.9 01/20/2022   HGB 10.3 (L) 01/20/2022   HCT 31.1 (L) 01/20/2022   MCV 85.2 01/20/2022   PLT 387 01/20/2022   Lab Results  Component Value   Date   NA 140 01/20/2022   K 3.8 01/20/2022   CL 106 01/20/2022   CO2 28 01/20/2022    RADIOGRAPHIC STUDIES: I have personally reviewed the radiological reports and agreed with the findings in the report.  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  Since last visit she had mastectomy which showed grade 3 invasive ductal carcinoma measuring 4.3 cm in greatest dimension, margins negative along with solid type DCIS high-grade.  Right axillary lymph node evaluation showed 1 out of 8 lymph nodes positive for malignancy.  Oncotype test resulted at 24, benefit of chemotherapy cannot be excluded. During her last visit, I recommended, given young age, high-grade, Oncotype of 24 and positive lymph node involvement.   She is now on adj AC-T, completed AC * 4 cycles. She is now on adjuvant Taxol, tolerating it very well.  Recently she called because of some sore throat, prescribed Augmentin which has been working well for her.  No concerns on exam today.  CBC appears satisfactory, CMP pending at the time of my visit.  She will continue adjuvant Taxol.  No dose-limiting toxicity noted.  No neuropathy reported.   She will return to clinic in 2 weeks as scheduled.  She will be candidate for adj abema and anti estrogen therapy after completion of chemo and radiation if applicable.    Total time spent: 30 minutes including history, review of records, counseling and coordination of care All questions were answered. The patient knows to call the clinic with any problems, questions or concerns.    [name], MD 01/20/22

## 2022-01-20 NOTE — Assessment & Plan Note (Signed)
This is a 46 year old female patient with newly diagnosed right breast IDC, ER/PR positive, HER2 negative, clinically stage T2 N0 M0 grade 2/3 referred to medical oncology for recommendations.  Since last visit she had mastectomy which showed grade 3 invasive ductal carcinoma measuring 4.3 cm in greatest dimension, margins negative along with solid type DCIS high-grade.  Right axillary lymph node evaluation showed 1 out of 8 lymph nodes positive for malignancy.  Oncotype test resulted at 24, benefit of chemotherapy cannot be excluded. During her last visit, I recommended, given young age, high-grade, Oncotype of 24 and positive lymph node involvement.   She is now on adj AC-T, completed AC * 4 cycles. She is now on adjuvant Taxol, tolerating it very well.  Recently she called because of some sore throat, prescribed Augmentin which has been working well for her.  No concerns on exam today.  CBC appears satisfactory, CMP pending at the time of my visit.  She will continue adjuvant Taxol.  No dose-limiting toxicity noted.  No neuropathy reported.  She will return to clinic in 2 weeks as scheduled.  She will be candidate for adj abema and anti estrogen therapy after completion of chemo and radiation if applicable.

## 2022-01-20 NOTE — Patient Instructions (Addendum)
Instrucciones al darle de alta: Discharge Instructions Gracias por elegir al [facility] de Cncer de [place] para brindarle atencin mdica de oncologa y Music therapist.   Si usted tiene una cita de laboratorio con [place], por favor vaya directamente [place] y regstrese en el rea de Control and instrumentation engineer.   Use ropa cmoda y [place] para tener fcil acceso a las vas del Portacath (acceso venoso de Engineer, site duracin) o la lnea PICC (catter central colocado por va perifrica).   Nos esforzamos por ofrecerle tiempo de calidad con su proveedor. Es posible que tenga que volver a programar su cita si llega tarde (15 minutos o ms).  El llegar tarde le afecta a usted y a otros pacientes cuyas citas son posteriores a Merchandiser, retail.  Adems, si usted falta a tres o ms citas sin avisar a la oficina, puede ser retirado(a) de la clnica a discrecin del proveedor.      Para las solicitudes de renovacin de recetas, pida a su farmacia que se ponga en contacto con nuestra oficina y deje que transcurran 22 horas para que se complete el proceso de las renovaciones.    Hoy usted recibi los siguientes agentes de quimioterapia e/o inmunoterapia: Paclitaxel (Taxol)   Para ayudar a prevenir las nuseas y los vmitos despus de su tratamiento, le recomendamos que tome su medicamento para las nuseas segn las indicaciones.  LOS SNTOMAS QUE DEBEN COMUNICARSE INMEDIATAMENTE SE INDICAN A CONTINUACIN: *FIEBRE SUPERIOR A 100.4 F (38 C) O MS *ESCALOFROS O SUDORACIN *NUSEAS Y VMITOS QUE NO SE CONTROLAN CON EL MEDICAMENTO PARA LAS NUSEAS *DIFICULTAD INUSUAL PARA RESPIRAR  *MORETONES O HEMORRAGIAS NO HABITUALES *PROBLEMAS URINARIOS (dolor o ardor al Garment/textile technologist o frecuencia para Garment/textile technologist) *PROBLEMAS INTESTINALES (diarrea inusual, estreimiento, dolor cerca del ano) SENSIBILIDAD EN LA BOCA Y EN LA GARGANTA CON O SIN LA PRESENCIA DE LCERAS (dolor de garganta, llagas en la boca o dolor de  muelas/dientes) ERUPCIN, HINCHAZN O DOLORES INUSUALES FLUJO VAGINAL INUSUAL O PICAZN/RASQUIA    Los puntos marcados con un asterisco ( *) indican una posible emergencia y debe hacer un seguimiento tan pronto como le sea posible o vaya al Departamento de Emergencias si se le presenta algn problema.  Por favor, muestre la [place] DE ADVERTENCIA DE Windy Canny DE ADVERTENCIA DE Benay Spice al registrarse en [address] de Emergencias y a la enfermera de triaje.  Si tiene preguntas despus de su visita o necesita cancelar o volver a programar su cita, por favor pngase en contacto con [place]  Dept: [phone] y [place]. Las horas de oficina son de 8:00 a.m. a 4:30 p.m. de lunes a viernes. Por favor, tenga en cuenta que los mensajes de voz que se dejan despus de las 4:00 p.m. posiblemente no se devolvern hasta el siguiente da de [place].  Cerramos los fines de semana y [employer]. En todo momento tiene acceso a una enfermera para preguntas urgentes. Por favor, llame al nmero principal de la clnica Dept: [phone] y [place] instrucciones.   Para cualquier pregunta que no sea de carcter urgente, tambin puede ponerse en contacto con su proveedor [employer]. Ahora ofrecemos visitas electrnicas para cualquier persona mayor de 18 aos que solicite atencin mdica en lnea para los sntomas que no sean urgentes. Para ms detalles vaya a [email or web address].   Tambin puede bajar la aplicacin de MyChart! Vaya a la tienda de aplicaciones, busque "MyChart", abra la aplicacin, seleccione [employer],  e ingrese con su nombre de usuario y la contrasea de Pharmacist, community.  Las mscaras son opcionales en los centros de Hotel manager. Si desea que su equipo de cuidados mdicos use una [employer] atienden, por favor hgaselo saber al personal. Bethann Berkshire una persona de apoyo que tenga por lo menos 16 aos  para que le acompae a sus citas. Paclitaxel Injection Qu es este medicamento? El PACLITAXEL es un agente quimioteraputico. Este medicamento acta sobre las clulas que se dividen rpidamente, como las clulas cancerosas, y finalmente provoca la muerte de estas clulas. Se utiliza en el tratamiento del cncer de ovario, mama, pulmn, sarcoma de [place] y otros tipos de cncer. Este medicamento puede ser utilizado para otros usos; si tiene alguna pregunta consulte con su proveedor de atencin mdica o con su farmacutico. MARCAS COMUNES: Onxol, Taxol Qu le debo informar a mi profesional de la salud antes de tomar este medicamento? Necesitan saber si usted presenta alguno de los siguientes problemas o situaciones: antecedentes de frecuencia cardiaca irregular enfermedad heptica recuentos sanguneos bajos, como baja cantidad de glbulos blancos, plaquetas o glbulos rojos enfermedad pulmonar o respiratoria, como asma hormigueo en las manos o los pies, u otro trastorno del sistema nervioso una reaccin alrgica o inusual al paclitaxel, al alcohol, al aceite de ricino polioxietilado, a otros medicamentos quimioteraputicos, a otros medicamentos, alimentos, colorantes o conservantes si est embarazada o buscando quedar embarazada si est amamantando a un beb Cmo debo [employer]? Este medicamento se administra como infusin en una vena. Un profesional de la salud especialmente capacitado lo administra en un hospital o clnica. Hable con su pediatra para informarse acerca del uso de este medicamento en nios. Puede requerir atencin especial. Sobredosis: Pngase en contacto inmediatamente con un centro toxicolgico o una sala de urgencia si usted cree que haya tomado demasiado medicamento. ATENCIN: [employer] es solo para usted. No comparta este medicamento con nadie. Qu sucede si me olvido de una dosis? Es importante no olvidar ninguna dosis. Informe a su mdico o a su  profesional de la salud si no puede asistir a Photographer. Qu puede interactuar con este medicamento? No use este medicamento con ninguno de los siguientes frmacos: vacunas de virus vivos Este medicamento tambin puede interactuar con los siguientes frmacos: medicamentos antivirales para la hepatitis, VIH o SIDA ciertos antibiticos, tales como eritromicina y Ambulance person ciertos medicamentos para infecciones micticas, tales como itraconazol y ketoconazol ciertos medicamentos para convulsiones, tales como [place], fenobarbital y fenitona gemfibrozil nefazodona rifampicina hierba de [place] Puede ser que esta lista no menciona todas las posibles interacciones. Informe a su profesional de [employer]	[place] de [employer] productos a base de hierbas, medicamentos de [place] o suplementos nutritivos que est tomando. Si usted fuma, consume bebidas alcohlicas o si utiliza drogas ilegales, indqueselo tambin a su profesional de [employer]	[place]. Algunas sustancias pueden interactuar con su medicamento. A qu debo estar atento al usar [employer]a? Se supervisar su estado de salud atentamente mientras reciba este medicamento. Tendr que hacerse anlisis de sangre importantes mientras est usando este medicamento. Este medicamento puede causar Chief of Staff graves. Para reducir su riesgo, necesitar tomar otro(s) medicamento(s) antes del tratamiento con este medicamento. Si tiene [employer] erupcin cutnea, comezn/picazn o urticaria, hinchazn del rostro, los labios, o la [place], informe de inmediato a su mdico o profesional de Technical sales engineer. En algunos casos, podra recibir [employer]ands para ayudarlo con los efectos secundarios. Siga todas las instrucciones para usarlos. Este medicamento podra hacerle sentir  un Nurse, mental health. Esto es normal, ya que la quimioterapia puede afectar tanto a las clulas sanas como a las clulas cancerosas. Si presenta algn efecto  secundario, infrmelo. Contine con el tratamiento aun si se siente enfermo, a menos que su mdico le indique que lo suspenda. Llame a su mdico o a su profesional de la salud si tiene fiebre, escalofros o dolor de garganta, o cualquier otro sntoma de resfro o gripe. No se trate usted mismo. Este medicamento reduce la capacidad del cuerpo para combatir infecciones. Trate de no acercarse a personas que estn enfermas. Este medicamento podra aumentar el riesgo de moretones o sangrado. Consulte a su mdico o a su profesional de la salud si observa sangrados inusuales. Proceda con cuidado al cepillar sus dientes, usar hilo dental o Risk manager palillos para los dientes, ya que podra contraer una infeccin o Therapist, art con mayor facilidad. Si se somete a algn tratamiento dental, informe a su dentista que est [employer]. Evite usar productos que contienen aspirina, acetaminofeno, ibuprofeno, naproxeno o ketoprofeno, a menos que as lo indique su mdico. Estos productos pueden ocultar la fiebre. No debe quedar embarazada mientras est [employer]. Las mujeres deben informar a su mdico si estn buscando quedar embarazadas o si creen que podran estar embarazadas. Existe la posibilidad de efectos secundarios graves en un beb sin nacer. Para obtener ms informacin, hable con su profesional de la salud o su farmacutico. No debe Economist a un beb mientras est usando este medicamento. Para los hombres, se desaconseja concebir hijos mientras reciben Coca-Cola. Este producto podra contener alcohol. Pregunte a Midwife o a su proveedor de atencin de la salud si este medicamento contiene alcohol. Asegrese de decirles a todos los proveedores de atencin de la salud que usted est tomando [place]. Ciertos medicamentos, como metronidazol y disulfiram, pueden causar una reaccin desagradable cuando se usan con alcohol. Esta reaccin incluye enrojecimiento, dolor de  cabeza, nuseas, vmitos, sudoracin y aumento de la sed. La reaccin puede durar de 30 minutos a varias horas. Qu efectos secundarios puedo tener al [employer] este medicamento? Efectos secundarios que debe informar a su mdico o a Barrister's clerk de la salud tan pronto como sea posible: Chief of Staff, tales como erupcin cutnea, comezn/picazn o urticaria, e hinchazn de la cara, los labios o la lengua problemas para respirar cambios en la visin frecuencia cardiaca rpida e irregular presin sangunea alta o baja llagas en la boca dolor, hormigueo o entumecimiento de las manos o los pies signos de disminucin en la cantidad de plaquetas o sangrado: moretones, puntos rojos en la piel, heces de color negro y aspecto alquitranado, sangre en la orina signos de disminucin en la cantidad de glbulos rojos: debilidad o cansancio inusuales, sensacin de Secondary school teacher o aturdimiento, cadas signos de infeccin: fiebre o escalofros, tos, dolor de garganta, dolor o dificultad para orinar signos y sntomas de lesin en el hgado, tales como orina amarilla oscura o [place]; sensacin general de estar enfermo o sntomas gripales; heces claras; prdida del apetito; nuseas; dolor en la regin abdominal superior derecha; debilidad o cansancio inusuales; color amarillento de los ojos o la piel hinchazn de tobillos, pies, manos frecuencia cardiaca inusualmente lenta Efectos secundarios que generalmente no requieren atencin mdica (infrmelos a su mdico o a su profesional de la salud si persisten o si son molestos): diarrea cada del cabello prdida del apetito dolores musculares o articulares nuseas, vmito dolor, enrojecimiento o irritacin en el lugar de la inyeccin cansancio Puede ser que  esta lista no menciona todos los posibles efectos secundarios. Comunquese a su mdico por asesoramiento mdico [employer] Inc. Usted puede informar los efectos secundarios a la FDA por telfono  al 1-800-FDA-1088. Dnde debo guardar mi medicina? Este medicamento se administra en hospitales o clnicas, y no necesitar guardarlo en su domicilio. ATENCIN: Este folleto es un resumen. Puede ser que no cubra toda la posible informacin. Si usted tiene preguntas acerca de esta medicina, consulte con su mdico, su farmacutico o su profesional de Technical sales engineer.  2023 Elsevier/Gold Standard (2019-06-16 00:00:00)

## 2022-01-21 ENCOUNTER — Encounter: Payer: Self-pay | Admitting: Hematology and Oncology

## 2022-01-21 NOTE — Progress Notes (Signed)
Received message patient was looking for gas card yesterday afternoon.  Called and spoke with her husband to clarify process. Advised we could mail one from yesterday. He agreed and was very Patent attorney.  Spoke with registrar to have placed in outgoing mail and addressed to patient.

## 2022-01-22 ENCOUNTER — Encounter: Payer: Self-pay | Admitting: Hematology and Oncology

## 2022-01-23 ENCOUNTER — Other Ambulatory Visit: Payer: Self-pay

## 2022-01-24 MED FILL — Dexamethasone Sodium Phosphate Inj 100 MG/10ML: INTRAMUSCULAR | Qty: 1 | Status: AC

## 2022-01-27 ENCOUNTER — Other Ambulatory Visit: Payer: Self-pay

## 2022-01-27 ENCOUNTER — Inpatient Hospital Stay: Payer: No Typology Code available for payment source | Attending: Hematology and Oncology

## 2022-01-27 ENCOUNTER — Inpatient Hospital Stay: Payer: No Typology Code available for payment source

## 2022-01-27 VITALS — BP 139/65 | HR 97 | Temp 98.6°F | Resp 17 | Wt 147.5 lb

## 2022-01-27 DIAGNOSIS — C773 Secondary and unspecified malignant neoplasm of axilla and upper limb lymph nodes: Secondary | ICD-10-CM | POA: Insufficient documentation

## 2022-01-27 DIAGNOSIS — Z5111 Encounter for antineoplastic chemotherapy: Secondary | ICD-10-CM | POA: Insufficient documentation

## 2022-01-27 DIAGNOSIS — C50411 Malignant neoplasm of upper-outer quadrant of right female breast: Secondary | ICD-10-CM

## 2022-01-27 DIAGNOSIS — Z95828 Presence of other vascular implants and grafts: Secondary | ICD-10-CM

## 2022-01-27 DIAGNOSIS — R059 Cough, unspecified: Secondary | ICD-10-CM | POA: Insufficient documentation

## 2022-01-27 DIAGNOSIS — Z170 Estrogen receptor positive status [ER+]: Secondary | ICD-10-CM | POA: Insufficient documentation

## 2022-01-27 DIAGNOSIS — G629 Polyneuropathy, unspecified: Secondary | ICD-10-CM | POA: Insufficient documentation

## 2022-01-27 DIAGNOSIS — D649 Anemia, unspecified: Secondary | ICD-10-CM | POA: Insufficient documentation

## 2022-01-27 LAB — CBC WITH DIFFERENTIAL (CANCER CENTER ONLY)
Abs Immature Granulocytes: 0.05 10*3/uL (ref 0.00–0.07)
Basophils Absolute: 0 10*3/uL (ref 0.0–0.1)
Basophils Relative: 0 %
Eosinophils Absolute: 0.1 10*3/uL (ref 0.0–0.5)
Eosinophils Relative: 2 %
HCT: 30.4 % — ABNORMAL LOW (ref 36.0–46.0)
Hemoglobin: 10.1 g/dL — ABNORMAL LOW (ref 12.0–15.0)
Immature Granulocytes: 1 %
Lymphocytes Relative: 10 %
Lymphs Abs: 1 10*3/uL (ref 0.7–4.0)
MCH: 28.9 pg (ref 26.0–34.0)
MCHC: 33.2 g/dL (ref 30.0–36.0)
MCV: 87.1 fL (ref 80.0–100.0)
Monocytes Absolute: 0.6 10*3/uL (ref 0.1–1.0)
Monocytes Relative: 7 %
Neutro Abs: 7.8 10*3/uL — ABNORMAL HIGH (ref 1.7–7.7)
Neutrophils Relative %: 80 %
Platelet Count: 328 10*3/uL (ref 150–400)
RBC: 3.49 MIL/uL — ABNORMAL LOW (ref 3.87–5.11)
RDW: 14.3 % (ref 11.5–15.5)
WBC Count: 9.6 10*3/uL (ref 4.0–10.5)
nRBC: 0 % (ref 0.0–0.2)

## 2022-01-27 LAB — CMP (CANCER CENTER ONLY)
ALT: 13 U/L (ref 0–44)
AST: 13 U/L — ABNORMAL LOW (ref 15–41)
Albumin: 4.1 g/dL (ref 3.5–5.0)
Alkaline Phosphatase: 54 U/L (ref 38–126)
Anion gap: 7 (ref 5–15)
BUN: 12 mg/dL (ref 6–20)
CO2: 26 mmol/L (ref 22–32)
Calcium: 9.4 mg/dL (ref 8.9–10.3)
Chloride: 104 mmol/L (ref 98–111)
Creatinine: 0.59 mg/dL (ref 0.44–1.00)
GFR, Estimated: >60 mL/min (ref >60)
Glucose, Bld: 107 mg/dL — ABNORMAL HIGH (ref 70–99)
Potassium: 3.8 mmol/L (ref 3.5–5.1)
Sodium: 137 mmol/L (ref 135–145)
Total Bilirubin: 0.3 mg/dL (ref 0.3–1.2)
Total Protein: 7.3 g/dL (ref 6.5–8.1)

## 2022-01-27 MED ORDER — SODIUM CHLORIDE 0.9 % IV SOLN
10.0000 mg | Freq: Once | INTRAVENOUS | Status: AC
  Administered 2022-01-27: 10 mg via INTRAVENOUS
  Filled 2022-01-27: qty 10

## 2022-01-27 MED ORDER — CETIRIZINE HCL 10 MG/ML IV SOLN
10.0000 mg | Freq: Once | INTRAVENOUS | Status: AC
  Administered 2022-01-27: 10 mg via INTRAVENOUS
  Filled 2022-01-27: qty 1

## 2022-01-27 MED ORDER — SODIUM CHLORIDE 0.9 % IV SOLN: Freq: Once | INTRAVENOUS | Status: AC

## 2022-01-27 MED ORDER — SODIUM CHLORIDE 0.9% FLUSH
10.0000 mL | INTRAVENOUS | Status: AC | PRN
  Administered 2022-01-27: 10 mL

## 2022-01-27 MED ORDER — SODIUM CHLORIDE 0.9 % IV SOLN
80.0000 mg/m2 | Freq: Once | INTRAVENOUS | Status: AC
  Administered 2022-01-27: 138 mg via INTRAVENOUS
  Filled 2022-01-27: qty 23

## 2022-01-27 MED ORDER — FAMOTIDINE IN NACL 20-0.9 MG/50ML-% IV SOLN
20.0000 mg | Freq: Once | INTRAVENOUS | Status: AC
  Administered 2022-01-27: 20 mg via INTRAVENOUS
  Filled 2022-01-27: qty 50

## 2022-01-31 MED FILL — Dexamethasone Sodium Phosphate Inj 100 MG/10ML: INTRAMUSCULAR | Qty: 1 | Status: AC

## 2022-02-03 ENCOUNTER — Inpatient Hospital Stay (HOSPITAL_BASED_OUTPATIENT_CLINIC_OR_DEPARTMENT_OTHER): Payer: No Typology Code available for payment source | Admitting: Hematology and Oncology

## 2022-02-03 ENCOUNTER — Encounter: Payer: Self-pay | Admitting: Hematology and Oncology

## 2022-02-03 ENCOUNTER — Inpatient Hospital Stay: Payer: No Typology Code available for payment source

## 2022-02-03 VITALS — BP 122/75 | HR 95 | Temp 97.6°F | Resp 18 | Ht 65.0 in | Wt 149.0 lb

## 2022-02-03 DIAGNOSIS — C50411 Malignant neoplasm of upper-outer quadrant of right female breast: Secondary | ICD-10-CM

## 2022-02-03 LAB — CBC WITH DIFFERENTIAL (CANCER CENTER ONLY)
Abs Immature Granulocytes: 0.04 10*3/uL (ref 0.00–0.07)
Basophils Absolute: 0 10*3/uL (ref 0.0–0.1)
Basophils Relative: 1 %
Eosinophils Absolute: 0.2 10*3/uL (ref 0.0–0.5)
Eosinophils Relative: 3 %
HCT: 31.2 % — ABNORMAL LOW (ref 36.0–46.0)
Hemoglobin: 10.3 g/dL — ABNORMAL LOW (ref 12.0–15.0)
Immature Granulocytes: 1 %
Lymphocytes Relative: 16 %
Lymphs Abs: 1 10*3/uL (ref 0.7–4.0)
MCH: 28.6 pg (ref 26.0–34.0)
MCHC: 33 g/dL (ref 30.0–36.0)
MCV: 86.7 fL (ref 80.0–100.0)
Monocytes Absolute: 0.4 10*3/uL (ref 0.1–1.0)
Monocytes Relative: 7 %
Neutro Abs: 4.5 10*3/uL (ref 1.7–7.7)
Neutrophils Relative %: 72 %
Platelet Count: 340 10*3/uL (ref 150–400)
RBC: 3.6 MIL/uL — ABNORMAL LOW (ref 3.87–5.11)
RDW: 13.9 % (ref 11.5–15.5)
WBC Count: 6.2 10*3/uL (ref 4.0–10.5)
nRBC: 0 % (ref 0.0–0.2)

## 2022-02-03 LAB — CMP (CANCER CENTER ONLY)
ALT: 10 U/L (ref 0–44)
AST: 12 U/L — ABNORMAL LOW (ref 15–41)
Albumin: 3.9 g/dL (ref 3.5–5.0)
Alkaline Phosphatase: 49 U/L (ref 38–126)
Anion gap: 5 (ref 5–15)
BUN: 16 mg/dL (ref 6–20)
CO2: 28 mmol/L (ref 22–32)
Calcium: 9.2 mg/dL (ref 8.9–10.3)
Chloride: 104 mmol/L (ref 98–111)
Creatinine: 0.62 mg/dL (ref 0.44–1.00)
GFR, Estimated: >60 mL/min (ref >60)
Glucose, Bld: 102 mg/dL — ABNORMAL HIGH (ref 70–99)
Potassium: 3.8 mmol/L (ref 3.5–5.1)
Sodium: 137 mmol/L (ref 135–145)
Total Bilirubin: 0.3 mg/dL (ref 0.3–1.2)
Total Protein: 7 g/dL (ref 6.5–8.1)

## 2022-02-03 MED ORDER — CETIRIZINE HCL 10 MG/ML IV SOLN
10.0000 mg | Freq: Once | INTRAVENOUS | Status: AC
  Administered 2022-02-03: 10 mg via INTRAVENOUS
  Filled 2022-02-03: qty 1

## 2022-02-03 MED ORDER — HEPARIN SOD (PORK) LOCK FLUSH 100 UNIT/ML IV SOLN
500.0000 [IU] | Freq: Once | INTRAVENOUS | Status: AC | PRN
  Administered 2022-02-03: 500 [IU]

## 2022-02-03 MED ORDER — SODIUM CHLORIDE 0.9 % IV SOLN: Freq: Once | INTRAVENOUS | Status: AC

## 2022-02-03 MED ORDER — SODIUM CHLORIDE 0.9% FLUSH
10.0000 mL | INTRAVENOUS | Status: DC | PRN
  Administered 2022-02-03: 10 mL

## 2022-02-03 MED ORDER — SODIUM CHLORIDE 0.9% FLUSH
10.0000 mL | INTRAVENOUS | Status: DC | PRN
  Administered 2022-02-03: 10 mL via INTRAVENOUS

## 2022-02-03 MED ORDER — SODIUM CHLORIDE 0.9 % IV SOLN
80.0000 mg/m2 | Freq: Once | INTRAVENOUS | Status: AC
  Administered 2022-02-03: 138 mg via INTRAVENOUS
  Filled 2022-02-03: qty 23

## 2022-02-03 MED ORDER — GUAIFENESIN-CODEINE 100-10 MG/5ML PO SOLN: 5.0000 mL | Freq: Four times a day (QID) | ORAL | 0 refills | Status: DC | PRN

## 2022-02-03 MED ORDER — FAMOTIDINE IN NACL 20-0.9 MG/50ML-% IV SOLN
20.0000 mg | Freq: Once | INTRAVENOUS | Status: AC
  Administered 2022-02-03: 20 mg via INTRAVENOUS
  Filled 2022-02-03: qty 50

## 2022-02-03 MED ORDER — SODIUM CHLORIDE 0.9 % IV SOLN
10.0000 mg | Freq: Once | INTRAVENOUS | Status: AC
  Administered 2022-02-03: 10 mg via INTRAVENOUS
  Filled 2022-02-03: qty 10

## 2022-02-03 NOTE — Progress Notes (Signed)
[place] NOTE  Patient Care Team: [name], MD as PCP - General (Hematology and Oncology) Rockwell Germany, RN as Oncology Nurse Navigator Mauro Kaufmann, RN as Oncology Nurse Navigator [name], MD as Consulting Physician (Hematology and Oncology) Jovita Kussmaul, MD as Consulting Physician (General Surgery) Kyung Rudd, MD as Consulting Physician (Radiation Oncology) [name], MD as Consulting Physician (Hematology and Oncology)  CHIEF COMPLAINTS/PURPOSE OF CONSULTATION:  Newly diagnosed breast cancer on taxol.  HISTORY OF PRESENTING ILLNESS:  Hannah Murray 46 y.o. female is here because of recent diagnosis of right breast cancer  I reviewed her records extensively and collaborated the history with the patient.  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 Indeterminate mass in the right breast at 12:30, which may represent a fibroadenoma. No evidence of right axillary adenopathy.   05/16/2021 Pathology Results   Right breast 10 0 clock mass biopsy showed invasive mammary carcinoma, ductal phenotype prognostics include ER 95% strong staining intensity PR 95% strong staining intensity Ki-67 of 40% and HER2 negative   06/11/2021 Cancer Staging   Staging form: Breast, AJCC 8th Edition - Pathologic: Stage IIA (pT2, pN1a, cM0, G3, ER+, PR+, HER2-, Oncotype DX score: 24) - Signed by [name], MD on 11/04/2021 Multigene prognostic tests performed: Oncotype DX Recurrence score range: Greater than or equal to 11 Histologic grading system: 3 grade system   07/10/2021 Genetic Testing   Negative hereditary cancer genetic testing: no pathogenic variants detected [facility] Breast STAT Panel or Common Hereditary Cancers +RNA Panel.   Report dates are Jul 10, 2021 and Jul 18, 2021.   The STAT Breast cancer panel offered by [facility] includes sequencing and rearrangement analysis for the following 9 genes:  ATM, BRCA1, BRCA2, CDH1, CHEK2, PALB2, PTEN, STK11 and TP53.   The Common Hereditary Cancers + RNA Panel offered by [facility] includes sequencing, deletion/duplication, and RNA testing of the following 47 genes: APC, ATM, AXIN2, BARD1, BMPR1A, BRCA1, BRCA2, BRIP1, CDH1, CDK4*, CDKN2A (p14ARF)*, CDKN2A (p16INK4a)*, CHEK2, CTNNA1, DICER1, EPCAM (Deletion/duplication testing only), GREM1 (promoter region deletion/duplication testing only), KIT, MEN1, MLH1, MSH2, MSH3, MSH6, MUTYH, NBN, NF1, NHTL1, PALB2, PDGFRA*, PMS2, POLD1, POLE, PTEN, RAD50, RAD51C, RAD51D, SDHB, SDHC, SDHD, SMAD4, SMARCA4. STK11, TP53, TSC1, TSC2, and VHL.  The following genes were evaluated for sequence changes only: SDHA and HOXB13 c.251G>A variant only.  RNA analysis is not performed for the * genes.     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  Right axillary lymph node evaluation showed 1 out of 8 lymph nodes positive for malignancy., final pathologic staging T2N1A.    11/11/2021 -  Chemotherapy   Patient is on Treatment Plan : BREAST ADJUVANT DOSE DENSE AC q14d / PACLitaxel Z8H      A certified Patent attorney used for the conversation.   [name] has been feeling really well since her last visit  She is noticing a little cough when she lays down at night. She still notices change in voice, but denies sore throat. No nausea, vomiting or diarrhea.  No neuropathy reported. Rest of the pertinent 10 point  ROS reviewed and negative  SURGICAL HISTORY: Past Surgical History:  Procedure Laterality Date   BREAST RECONSTRUCTION WITH PLACEMENT OF TISSUE EXPANDER AND FLEX HD (ACELLULAR  HYDRATED DERMIS) Right 09/30/2021   Procedure: BREAST RECONSTRUCTION WITH PLACEMENT OF TISSUE EXPANDER AND FLEX HD (ACELLULAR HYDRATED DERMIS);  Surgeon: Wallace Going, DO;  Location: [place];  Service: Plastics;  Laterality: Right;   MASTECTOMY W/ SENTINEL NODE BIOPSY Right 09/30/2021   Procedure: RIGHT MASTECTOMY WITH SENTINEL LYMPH NODE BIOPSY;  Surgeon: Jovita Kussmaul, MD;  Location: [place];  Service: General;  Laterality: Right;   PORTACATH PLACEMENT Left 11/08/2021   Procedure: INSERTION PORT-A-CATH;  Surgeon: Jovita Kussmaul, MD;  Location: [place];  Service: General;  Laterality: Left;    SOCIAL HISTORY: Social History   Socioeconomic History   Marital status: Married    Spouse name: Not on file   Number of children: 1   Years of education: Not on file   Highest education level: High school graduate  Occupational History   Not on file  Tobacco Use   Smoking status: Never   Smokeless tobacco: Never  Vaping Use   Vaping Use: Never used  Substance and Sexual Activity   Alcohol use: Yes    Comment: occasionally   Drug use: Never   Sexual activity: Not Currently  Other Topics Concern   Not on file  Social History Narrative   Not on file   Social Determinants of Health   Financial Resource Strain: Not on file  Food Insecurity: No Food Insecurity (04/30/2021)   Hunger Vital Sign    Worried About Running Out of Food in the Last Year: Never true    Ran Out of Food in the Last Year: Never true  Transportation Needs: No Transportation Needs (04/30/2021)   PRAPARE - Hydrologist (Medical): No    Lack of Transportation (Non-Medical): No  Physical Activity: Not on file  Stress: Not on file  Social Connections: Not on file  Intimate Partner Violence: Not At Risk (07/02/2021)   Humiliation, Afraid, Rape, and Kick questionnaire    Fear of Current or Ex-Partner: No    Emotionally Abused: No     Physically Abused: No    Sexually Abused: No    FAMILY HISTORY: Family History  Problem Relation Age of Onset   Hypertension Father    Diabetes Father    Thyroid disease Father    Ovarian cancer Other        MGM's niece; d. > 35    ALLERGIES:  has No Known Allergies.  MEDICATIONS:  Current Outpatient Medications  Medication Sig Dispense Refill   guaiFENesin-codeine 100-10 MG/5ML syrup Take 5 mLs by mouth every 6 (six) hours as needed for cough. 120 mL 0   amoxicillin-clavulanate (AUGMENTIN) 500-125 MG tablet Take 1 tablet by mouth 3 (three) times daily. 21 tablet 0   dexamethasone (DECADRON) 4 MG tablet Take 2 tablets (8 mg total) by mouth daily for 3 days. Start the day after doxorubicin/cyclophosphamide chemotherapy. Take with food. (Patient not taking: Reported on 01/10/2022) 30 tablet 1   diphenhydrAMINE (BENADRYL) 25 MG tablet Take 25 mg by mouth every 6 (six) hours as needed for itching. (Patient not taking: Reported on 01/10/2022)     lidocaine-prilocaine (EMLA) cream Apply to affected area once (Patient not taking: Reported on 01/10/2022) 30 g 3   ondansetron (ZOFRAN) 8 MG tablet Take 1 tab (8 mg) by mouth  every 8 hrs as needed for nausea/vomiting. Start third day after doxorubicin/cyclophosphamide chemotherapy. (Patient not taking: Reported on 01/10/2022) 30 tablet 1   oxyCODONE (ROXICODONE) 5 MG immediate release tablet Take 1 tablet (5 mg total) by mouth every 6 (six) hours as needed for severe pain. (Patient not taking: Reported on 01/10/2022) 10 tablet 0   prochlorperazine (COMPAZINE) 10 MG tablet Take 1 tablet (10 mg total) by mouth every 6 (six) hours as needed for nausea or vomiting. (Patient not taking: Reported on 01/10/2022) 30 tablet 1   zolpidem (AMBIEN) 5 MG tablet Take 1 tablet (5 mg total) by mouth at bedtime as needed for sleep. (Patient not taking: Reported on 01/10/2022) 30 tablet 0   No current facility-administered medications for this visit.    REVIEW OF  SYSTEMS:   Constitutional: Denies fevers, chills or abnormal night sweats Eyes: Denies blurriness of vision, double vision or watery eyes Ears, nose, mouth, throat, and face: Denies mucositis or sore throat Respiratory: Denies cough, dyspnea or wheezes Cardiovascular: Denies palpitation, chest discomfort or lower extremity swelling Gastrointestinal:  Denies nausea, heartburn or change in bowel habits Skin: Denies abnormal skin rashes Lymphatics: Denies new lymphadenopathy or easy bruising Neurological:Denies numbness, tingling or new weaknesses Behavioral/Psych: Mood is stable, no new changes  Breast: Denies any palpable lumps or discharge All other systems were reviewed with the patient and are negative.  PHYSICAL EXAMINATION: ECOG PERFORMANCE STATUS: 0 - Asymptomatic  Vitals:   02/03/22 1232  BP: 122/75  Pulse: 95  Resp: 18  Temp: 97.6 F (36.4 C)  SpO2: 97%   Filed Weights   02/03/22 1232  Weight: 149 lb (67.6 kg)    Physical Exam Constitutional:      Appearance: Normal appearance.  HENT:     Mouth/Throat:     Comments: No tonsillomegaly or posterior pharyngeal erythema. Cardiovascular:     Rate and Rhythm: Normal rate and regular rhythm.  Abdominal:     General: Abdomen is flat. There is no distension.     Tenderness: There is no abdominal tenderness.  Musculoskeletal:        General: No swelling. Normal range of motion.     Cervical back: Normal range of motion and neck supple. No rigidity.  Lymphadenopathy:     Cervical: No cervical adenopathy.  Skin:    General: Skin is warm and dry.  Neurological:     Mental Status: She is alert.      LABORATORY DATA:  I have reviewed the data as listed Lab Results  Component Value Date   WBC 6.2 02/03/2022   HGB 10.3 (L) 02/03/2022   HCT 31.2 (L) 02/03/2022   MCV 86.7 02/03/2022   PLT 340 02/03/2022   Lab Results  Component Value Date   NA 137 01/27/2022   K 3.8 01/27/2022   CL 104 01/27/2022   CO2 26  01/27/2022    RADIOGRAPHIC STUDIES: I have personally reviewed the radiological reports and agreed with the findings in the report.  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  Since last visit she had mastectomy which showed grade 3 invasive ductal carcinoma measuring 4.3 cm in greatest dimension, margins negative along with solid type DCIS high-grade.  Right axillary lymph node evaluation showed 1 out of 8 lymph nodes  positive for malignancy.  Oncotype test resulted at 24, benefit of chemotherapy cannot be excluded. During her last visit, I recommended, given young age, high-grade, Oncotype of 24 and positive lymph node involvement.   She is now on adj AC-T, completed AC * 4 cycles really well except for ongoing recovery from the recent sore throat and some postnasal drip and resulting cough.  I will send her antitussive to help with the cough.  Otherwise I do not see any evidence of systemic infection.  Okay to proceed with chemotherapy if all labs are within parameters today.  CBC appear satisfactory so far. No dose-limiting toxicity noted.  No neuropathy reported.  She will return to clinic every 2 weeks clinic visit for weekly Taxol  She will be candidate for adj abema and anti estrogen therapy after completion of chemo and radiation if applicable.    Total time spent: 30 minutes including history, review of records, counseling and coordination of care All questions were answered. The patient knows to call the clinic with any problems, questions or concerns.    [name], MD 02/03/22

## 2022-02-03 NOTE — Assessment & Plan Note (Signed)
This is a 46 year old female patient with newly diagnosed right breast IDC, ER/PR positive, HER2 negative, clinically stage T2 N0 M0 grade 2/3 referred to medical oncology for recommendations.  Since last visit she had mastectomy which showed grade 3 invasive ductal carcinoma measuring 4.3 cm in greatest dimension, margins negative along with solid type DCIS high-grade.  Right axillary lymph node evaluation showed 1 out of 8 lymph nodes positive for malignancy.  Oncotype test resulted at 24, benefit of chemotherapy cannot be excluded. During her last visit, I recommended, given young age, high-grade, Oncotype of 24 and positive lymph node involvement.   She is now on adj AC-T, completed AC * 4 cycles really well except for ongoing recovery from the recent sore throat and some postnasal drip and resulting cough.  I will send her antitussive to help with the cough.  Otherwise I do not see any evidence of systemic infection.  Okay to proceed with chemotherapy if all labs are within parameters today.  CBC appear satisfactory so far. No dose-limiting toxicity noted.  No neuropathy reported.  She will return to clinic every 2 weeks clinic visit for weekly Taxol  She will be candidate for adj abema and anti estrogen therapy after completion of chemo and radiation if applicable.

## 2022-02-07 MED FILL — Dexamethasone Sodium Phosphate Inj 100 MG/10ML: INTRAMUSCULAR | Qty: 1 | Status: AC

## 2022-02-10 ENCOUNTER — Encounter: Payer: Self-pay | Admitting: Adult Health

## 2022-02-10 ENCOUNTER — Inpatient Hospital Stay: Payer: Self-pay

## 2022-02-10 ENCOUNTER — Encounter: Payer: Self-pay | Admitting: *Deleted

## 2022-02-10 ENCOUNTER — Other Ambulatory Visit: Payer: Self-pay

## 2022-02-10 ENCOUNTER — Inpatient Hospital Stay (HOSPITAL_BASED_OUTPATIENT_CLINIC_OR_DEPARTMENT_OTHER): Payer: No Typology Code available for payment source | Admitting: Adult Health

## 2022-02-10 VITALS — BP 129/67 | HR 80 | Temp 97.6°F | Resp 20

## 2022-02-10 DIAGNOSIS — C50411 Malignant neoplasm of upper-outer quadrant of right female breast: Secondary | ICD-10-CM

## 2022-02-10 DIAGNOSIS — Z95828 Presence of other vascular implants and grafts: Secondary | ICD-10-CM

## 2022-02-10 LAB — CMP (CANCER CENTER ONLY)
ALT: 14 U/L (ref 0–44)
AST: 16 U/L (ref 15–41)
Albumin: 3.8 g/dL (ref 3.5–5.0)
Alkaline Phosphatase: 53 U/L (ref 38–126)
Anion gap: 5 (ref 5–15)
BUN: 14 mg/dL (ref 6–20)
CO2: 27 mmol/L (ref 22–32)
Calcium: 9.2 mg/dL (ref 8.9–10.3)
Chloride: 106 mmol/L (ref 98–111)
Creatinine: 0.87 mg/dL (ref 0.44–1.00)
GFR, Estimated: >60 mL/min (ref >60)
Glucose, Bld: 100 mg/dL — ABNORMAL HIGH (ref 70–99)
Potassium: 3.9 mmol/L (ref 3.5–5.1)
Sodium: 138 mmol/L (ref 135–145)
Total Bilirubin: 0.3 mg/dL (ref 0.3–1.2)
Total Protein: 6.5 g/dL (ref 6.5–8.1)

## 2022-02-10 LAB — CBC WITH DIFFERENTIAL (CANCER CENTER ONLY)
Abs Immature Granulocytes: 0.04 10*3/uL (ref 0.00–0.07)
Basophils Absolute: 0 10*3/uL (ref 0.0–0.1)
Basophils Relative: 0 %
Eosinophils Absolute: 0.2 10*3/uL (ref 0.0–0.5)
Eosinophils Relative: 4 %
HCT: 31.5 % — ABNORMAL LOW (ref 36.0–46.0)
Hemoglobin: 10.1 g/dL — ABNORMAL LOW (ref 12.0–15.0)
Immature Granulocytes: 1 %
Lymphocytes Relative: 16 %
Lymphs Abs: 0.8 10*3/uL (ref 0.7–4.0)
MCH: 27.7 pg (ref 26.0–34.0)
MCHC: 32.1 g/dL (ref 30.0–36.0)
MCV: 86.5 fL (ref 80.0–100.0)
Monocytes Absolute: 0.4 10*3/uL (ref 0.1–1.0)
Monocytes Relative: 7 %
Neutro Abs: 3.6 10*3/uL (ref 1.7–7.7)
Neutrophils Relative %: 72 %
Platelet Count: 285 10*3/uL (ref 150–400)
RBC: 3.64 MIL/uL — ABNORMAL LOW (ref 3.87–5.11)
RDW: 14.3 % (ref 11.5–15.5)
WBC Count: 5 10*3/uL (ref 4.0–10.5)
nRBC: 0 % (ref 0.0–0.2)

## 2022-02-10 MED ORDER — SODIUM CHLORIDE 0.9% FLUSH
10.0000 mL | INTRAVENOUS | Status: DC | PRN
  Administered 2022-02-10: 10 mL

## 2022-02-10 MED ORDER — SODIUM CHLORIDE 0.9 % IV SOLN: Freq: Once | INTRAVENOUS | Status: AC

## 2022-02-10 MED ORDER — CETIRIZINE HCL 10 MG/ML IV SOLN
10.0000 mg | Freq: Once | INTRAVENOUS | Status: AC
  Administered 2022-02-10: 10 mg via INTRAVENOUS
  Filled 2022-02-10: qty 1

## 2022-02-10 MED ORDER — SODIUM CHLORIDE 0.9% FLUSH
10.0000 mL | INTRAVENOUS | Status: AC | PRN
  Administered 2022-02-10: 10 mL

## 2022-02-10 MED ORDER — SODIUM CHLORIDE 0.9 % IV SOLN
10.0000 mg | Freq: Once | INTRAVENOUS | Status: AC
  Administered 2022-02-10: 10 mg via INTRAVENOUS
  Filled 2022-02-10: qty 10

## 2022-02-10 MED ORDER — HEPARIN SOD (PORK) LOCK FLUSH 100 UNIT/ML IV SOLN
500.0000 [IU] | Freq: Once | INTRAVENOUS | Status: AC | PRN
  Administered 2022-02-10: 500 [IU]

## 2022-02-10 MED ORDER — SODIUM CHLORIDE 0.9 % IV SOLN
80.0000 mg/m2 | Freq: Once | INTRAVENOUS | Status: AC
  Administered 2022-02-10: 138 mg via INTRAVENOUS
  Filled 2022-02-10: qty 23

## 2022-02-10 MED ORDER — FAMOTIDINE IN NACL 20-0.9 MG/50ML-% IV SOLN
20.0000 mg | Freq: Once | INTRAVENOUS | Status: AC
  Administered 2022-02-10: 20 mg via INTRAVENOUS
  Filled 2022-02-10: qty 50

## 2022-02-10 NOTE — Assessment & Plan Note (Signed)
Hannah Murray is a 46 year old woman with history of stage IIa ER/PR positive breast cancer diagnosed in September 2023 status postlumpectomy, now followed by adjuvant chemotherapy.  1.  Stage IIa breast cancer on chemotherapy: She has completed 4 cycles of doxorubicin and cyclophosphamide.  She now continues on weekly Taxol which she is tolerating moderately well and will proceed with treatment today.  2.  Early peripheral sensory neuropathy: This is intermittent at this point and resolves shortly after it begins.  We will continue to monitor this and if it worsens we will consider reducing her dose.  3.  Chemotherapy-induced anemia: Her hemoglobin is 10.1 today.  We will continue to monitor.  I recommended continued activity level as she has been.  She will return in 1 week for labs, follow-up, and her next treatment.

## 2022-02-10 NOTE — Progress Notes (Signed)
[facility] Follow up:    Hannah Pike, MD [place] [id number]   DIAGNOSIS:  Cancer Staging  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, right ([place]) Staging form: Breast, AJCC 8th Edition - Pathologic: Stage IIA (pT2, pN1a, cM0, G3, ER+, PR+, HER2-, Oncotype DX score: 24) - Signed by Hannah Pike, MD on 11/04/2021 Multigene prognostic tests performed: Oncotype DX Recurrence score range: Greater than or equal to 11 Histologic grading system: 3 grade system   SUMMARY OF ONCOLOGIC HISTORY: Oncology History  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, right ([place])  05/06/2021 Mammogram   Highly suspicious ill defined palpable lump in the right breast at 10 0 clock position measuring at least 3.6 cms. Indeterminate mass in the right breast at 12:30, which may represent a fibroadenoma. No evidence of right axillary adenopathy.   05/16/2021 Pathology Results   Right breast 10 0 clock mass biopsy showed invasive mammary carcinoma, ductal phenotype prognostics include ER 95% strong staining intensity PR 95% strong staining intensity Ki-67 of 40% and HER2 negative   06/11/2021 Cancer Staging   Staging form: Breast, AJCC 8th Edition - Pathologic: Stage IIA (pT2, pN1a, cM0, G3, ER+, PR+, HER2-, Oncotype DX score: 24) - Signed by Hannah Pike, MD on 11/04/2021 Multigene prognostic tests performed: Oncotype DX Recurrence score range: Greater than or equal to 11 Histologic grading system: 3 grade system   07/10/2021 Genetic Testing   Negative hereditary cancer genetic testing: no pathogenic variants detected [facility] Breast STAT Panel or Common Hereditary Cancers +RNA Panel.  Report dates are Jul 10, 2021 and Jul 18, 2021.   The STAT Breast cancer panel offered by [facility] includes sequencing and rearrangement analysis for the following 9 genes:  ATM, BRCA1, BRCA2, CDH1, CHEK2, PALB2, PTEN, STK11 and TP53.   The Common  Hereditary Cancers + RNA Panel offered by [facility] includes sequencing, deletion/duplication, and RNA testing of the following 47 genes: APC, ATM, AXIN2, BARD1, BMPR1A, BRCA1, BRCA2, BRIP1, CDH1, CDK4*, CDKN2A (p14ARF)*, CDKN2A (p16INK4a)*, CHEK2, CTNNA1, DICER1, EPCAM (Deletion/duplication testing only), GREM1 (promoter region deletion/duplication testing only), KIT, MEN1, MLH1, MSH2, MSH3, MSH6, MUTYH, NBN, NF1, NHTL1, PALB2, PDGFRA*, PMS2, POLD1, POLE, PTEN, RAD50, RAD51C, RAD51D, SDHB, SDHC, SDHD, SMAD4, SMARCA4. STK11, TP53, TSC1, TSC2, and VHL.  The following genes were evaluated for sequence changes only: SDHA and HOXB13 c.251G>A variant only.  RNA analysis is not performed for the * genes.     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  Right axillary lymph node evaluation showed 1 out of 8 lymph nodes positive for malignancy., final pathologic staging T2N1A.    11/11/2021 -  Chemotherapy   Patient is on Treatment Plan : BREAST ADJUVANT DOSE DENSE AC q14d / PACLitaxel q7d       CURRENT THERAPY: Week 6 Taxol  INTERVAL HISTORY: Hannah Murray 46 y.o. female returns for f/u prior to receiving her weekly taxol.  She is accompanied by [place] [phone].    Hannah Murray is feeling well today.  The only thing she has noticed is intermittent tingling in her fingers that occurs the day following chemotherapy then subsequently resolves.  She denies any nausea, vomiting, mouth sores, headaches, fatigue, bowel/bladder changes.     Patient Active Problem List   Diagnosis Date Noted  Cellulitis 12/19/2021   Genetic testing 07/12/2021   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, right ([place]) 06/11/2021    has No Known Allergies.  MEDICAL HISTORY: Past Medical History:  Diagnosis Date    Medical history non-contributory     SURGICAL HISTORY: Past Surgical History:  Procedure Laterality Date   BREAST RECONSTRUCTION WITH PLACEMENT OF TISSUE EXPANDER AND FLEX HD (ACELLULAR HYDRATED DERMIS) Right 09/30/2021   Procedure: BREAST RECONSTRUCTION WITH PLACEMENT OF TISSUE EXPANDER AND FLEX HD (ACELLULAR HYDRATED DERMIS);  Surgeon: Wallace Going, DO;  Location: [facility];  Service: Plastics;  Laterality: Right;   MASTECTOMY W/ SENTINEL NODE BIOPSY Right 09/30/2021   Procedure: RIGHT MASTECTOMY WITH SENTINEL LYMPH NODE BIOPSY;  Surgeon: Jovita Kussmaul, MD;  Location: [facility];  Service: General;  Laterality: Right;   PORTACATH PLACEMENT Left 11/08/2021   Procedure: INSERTION PORT-A-CATH;  Surgeon: Jovita Kussmaul, MD;  Location: [facility];  Service: General;  Laterality: Left;    SOCIAL HISTORY: Social History   Socioeconomic History   Marital status: Married    Spouse name: Not on file   Number of children: 1   Years of education: Not on file   Highest education level: High school graduate  Occupational History   Not on file  Tobacco Use   Smoking status: Never   Smokeless tobacco: Never  Vaping Use   Vaping Use: Never used  Substance and Sexual Activity   Alcohol use: Yes    Comment: occasionally   Drug use: Never   Sexual activity: Not Currently  Other Topics Concern   Not on file  Social History Narrative   Not on file   Social Determinants of Health   Financial Resource Strain: Not on file  Food Insecurity: No Food Insecurity (04/30/2021)   Hunger Vital Sign    Worried About Running Out of Food in the Last Year: Never true    Ran Out of Food in the Last Year: Never true  Transportation Needs: No Transportation Needs (04/30/2021)   PRAPARE - Hydrologist (Medical): No    Lack of Transportation (Non-Medical): No  Physical Activity: Not on file  Stress: Not on file  Social  Connections: Not on file  Intimate Partner Violence: Not At Risk (07/02/2021)   Humiliation, Afraid, Rape, and Kick questionnaire    Fear of Current or Ex-Partner: No    Emotionally Abused: No    Physically Abused: No    Sexually Abused: No    FAMILY HISTORY: Family History  Problem Relation Age of Onset   Hypertension Father    Diabetes Father    Thyroid disease Father    Ovarian cancer Other        MGM's niece; d. > 57    Review of Systems  Constitutional:  Negative for appetite change, chills, fatigue, fever and unexpected weight change.  HENT:   Negative for hearing loss, lump/mass and trouble swallowing.   Eyes:  Negative for eye problems and icterus.  Respiratory:  Negative for chest tightness, cough and shortness of breath.   Cardiovascular:  Negative for chest pain, leg swelling and palpitations.  Gastrointestinal:  Negative for abdominal distention, abdominal pain, constipation, diarrhea, nausea and vomiting.  Endocrine: Negative for hot flashes.  Genitourinary:  Negative for difficulty urinating.   Musculoskeletal:  Negative for arthralgias.  Skin:  Negative for itching and rash.  Neurological:  Positive for numbness (per interval history about  tingling). Negative for dizziness, extremity weakness and headaches.  Hematological:  Negative for adenopathy. Does not bruise/bleed easily.  Psychiatric/Behavioral:  Negative for depression. The patient is not nervous/anxious.       PHYSICAL EXAMINATION  ECOG PERFORMANCE STATUS: 1 - Symptomatic but completely ambulatory  Vitals:   02/10/22 1328  BP: 135/81  Pulse: 84  Resp: 18  Temp: 97.7 F (36.5 C)  SpO2: 100%    Physical Exam Constitutional:      General: She is not in acute distress.    Appearance: Normal appearance. She is not toxic-appearing.  HENT:     Head: Normocephalic and atraumatic.  Eyes:     General: No scleral icterus. Cardiovascular:     Rate and Rhythm: Normal rate and regular rhythm.      Pulses: Normal pulses.     Heart sounds: Normal heart sounds.  Pulmonary:     Effort: Pulmonary effort is normal.     Breath sounds: Normal breath sounds.  Abdominal:     General: Abdomen is flat. Bowel sounds are normal. There is no distension.     Palpations: Abdomen is soft.     Tenderness: There is no abdominal tenderness.  Musculoskeletal:        General: No swelling.     Cervical back: Neck supple.  Lymphadenopathy:     Cervical: No cervical adenopathy.  Skin:    General: Skin is warm and dry.     Findings: No rash.  Neurological:     General: No focal deficit present.     Mental Status: She is alert.  Psychiatric:        Mood and Affect: Mood normal.        Behavior: Behavior normal.     LABORATORY DATA:  CBC    Component Value Date/Time   WBC 5.0 02/10/2022 1229   WBC 9.3 12/20/2021 0900   RBC 3.64 (L) 02/10/2022 1229   HGB 10.1 (L) 02/10/2022 1229   HCT 31.5 (L) 02/10/2022 1229   PLT 285 02/10/2022 1229   MCV 86.5 02/10/2022 1229   MCH 27.7 02/10/2022 1229   MCHC 32.1 02/10/2022 1229   RDW 14.3 02/10/2022 1229   LYMPHSABS 0.8 02/10/2022 1229   MONOABS 0.4 02/10/2022 1229   EOSABS 0.2 02/10/2022 1229   BASOSABS 0.0 02/10/2022 1229    CMP     Component Value Date/Time   NA 138 02/10/2022 1229   K 3.9 02/10/2022 1229   CL 106 02/10/2022 1229   CO2 27 02/10/2022 1229   GLUCOSE 100 (H) 02/10/2022 1229   BUN 14 02/10/2022 1229   CREATININE 0.87 02/10/2022 1229   CALCIUM 9.2 02/10/2022 1229   PROT 6.5 02/10/2022 1229   ALBUMIN 3.8 02/10/2022 1229   AST 16 02/10/2022 1229   ALT 14 02/10/2022 1229   ALKPHOS 53 02/10/2022 1229   BILITOT 0.3 02/10/2022 1229   GFRNONAA >60 02/10/2022 1229        ASSESSMENT and THERAPY PLAN:   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, right ([place]) Hannah Murray is a 46 year old woman with history of stage IIa ER/PR positive breast cancer diagnosed in September 2023 status postlumpectomy, now followed by  adjuvant chemotherapy.  1.  Stage IIa breast cancer on chemotherapy: She has completed 4 cycles of doxorubicin and cyclophosphamide.  She now continues on weekly Taxol which she is tolerating moderately well and will proceed with treatment today.  2.  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  We will continue to monitor this and if it worsens we will consider reducing her dose.  3.  Chemotherapy-induced anemia: Her hemoglobin is 10.1 today.  We will continue to monitor.  I recommended continued activity level as she has been.  She will return in 1 week for labs, follow-up, and her next treatment.    All questions were answered. The patient knows to call the clinic with any problems, questions or concerns. We can certainly see the patient much sooner if necessary.  Total encounter time:30 minutes*in face-to-face visit time, chart review, lab review, care coordination, order entry, and documentation of the encounter time.    Wilber Bihari, NP 02/10/22 2:21 PM Medical Oncology and Hematology [facility] [place], [place] [address] Tel. [phone]    Fax. [phone]  *Total Encounter Time as defined by the Centers for Medicare and Medicaid Services includes, in addition to the face-to-face time of a patient visit (documented in the note above) non-face-to-face time: obtaining and reviewing outside history, ordering and reviewing medications, tests or procedures, care coordination (communications with other health care professionals or caregivers) and documentation in the medical record.

## 2022-02-10 NOTE — Patient Instructions (Signed)
Instrucciones al darle de alta: Discharge Instructions Gracias por elegir al [facility] de Cncer de [place] para brindarle atencin mdica de oncologa y Music therapist.   Si usted tiene una cita de laboratorio con [place], por favor vaya directamente [place] y regstrese en el rea de Control and instrumentation engineer.   Use ropa cmoda y [place] para tener fcil acceso a las vas del Portacath (acceso venoso de Engineer, site duracin) o la lnea PICC (catter central colocado por va perifrica).   Nos esforzamos por ofrecerle tiempo de calidad con su proveedor. Es posible que tenga que volver a programar su cita si llega tarde (15 minutos o ms).  El llegar tarde le afecta a usted y a otros pacientes cuyas citas son posteriores a Merchandiser, retail.  Adems, si usted falta a tres o ms citas sin avisar a la oficina, puede ser retirado(a) de la clnica a discrecin del proveedor.      Para las solicitudes de renovacin de recetas, pida a su farmacia que se ponga en contacto con nuestra oficina y deje que transcurran 64 horas para que se complete el proceso de las renovaciones.    Hoy usted recibi los siguientes agentes de quimioterapia e/o inmunoterapia: Paclitaxel (Taxol)   Para ayudar a prevenir las nuseas y los vmitos despus de su tratamiento, le recomendamos que tome su medicamento para las nuseas segn las indicaciones.  LOS SNTOMAS QUE DEBEN COMUNICARSE INMEDIATAMENTE SE INDICAN A CONTINUACIN: *FIEBRE SUPERIOR A 100.4 F (38 C) O MS *ESCALOFROS O SUDORACIN *NUSEAS Y VMITOS QUE NO SE CONTROLAN CON EL MEDICAMENTO PARA LAS NUSEAS *DIFICULTAD INUSUAL PARA RESPIRAR  *MORETONES O HEMORRAGIAS NO HABITUALES *PROBLEMAS URINARIOS (dolor o ardor al Garment/textile technologist o frecuencia para Garment/textile technologist) *PROBLEMAS INTESTINALES (diarrea inusual, estreimiento, dolor cerca del ano) SENSIBILIDAD EN LA BOCA Y EN LA GARGANTA CON O SIN LA PRESENCIA DE LCERAS (dolor de garganta, llagas en la boca o dolor de  muelas/dientes) ERUPCIN, HINCHAZN O DOLORES INUSUALES FLUJO VAGINAL INUSUAL O PICAZN/RASQUIA    Los puntos marcados con un asterisco ( *) indican una posible emergencia y debe hacer un seguimiento tan pronto como le sea posible o vaya al Departamento de Emergencias si se le presenta algn problema.  Por favor, muestre la [place] DE ADVERTENCIA DE Windy Canny DE ADVERTENCIA DE Benay Spice al registrarse en [address] de Emergencias y a la enfermera de triaje.  Si tiene preguntas despus de su visita o necesita cancelar o volver a programar su cita, por favor pngase en contacto con [place]  Dept: [phone] y [place]. Las horas de oficina son de 8:00 a.m. a 4:30 p.m. de lunes a viernes. Por favor, tenga en cuenta que los mensajes de voz que se dejan despus de las 4:00 p.m. posiblemente no se devolvern hasta el siguiente da de [place].  Cerramos los fines de semana y [employer]. En todo momento tiene acceso a una enfermera para preguntas urgentes. Por favor, llame al nmero principal de la clnica Dept: [phone] y [place] instrucciones.   Para cualquier pregunta que no sea de carcter urgente, tambin puede ponerse en contacto con su proveedor [employer] Ahora ofrecemos visitas electrnicas para cualquier persona mayor de 18 aos que solicite atencin mdica en lnea para los sntomas que no sean urgentes. Para ms detalles vaya a [email or web address].   Tambin puede bajar la aplicacin de MyChart! Vaya a la tienda de aplicaciones, busque "MyChart", abra la aplicacin, seleccione [employer],  e ingrese con su nombre de usuario y la contrasea de Pharmacist, community.  Las mscaras son opcionales en los centros de Hotel manager. Si desea que su equipo de cuidados mdicos use una [employer] atienden, por favor hgaselo saber al personal. Bethann Berkshire una persona de apoyo que tenga por lo menos 16 aos  para que le acompae a sus citas. Paclitaxel Injection Qu es este medicamento? El PACLITAXEL es un agente quimioteraputico. Este medicamento acta sobre las clulas que se dividen rpidamente, como las clulas cancerosas, y finalmente provoca la muerte de estas clulas. Se utiliza en el tratamiento del cncer de ovario, mama, pulmn, sarcoma de [place] y otros tipos de cncer. Este medicamento puede ser utilizado para otros usos; si tiene alguna pregunta consulte con su proveedor de atencin mdica o con su farmacutico. MARCAS COMUNES: Onxol, Taxol Qu le debo informar a mi profesional de la salud antes de tomar este medicamento? Necesitan saber si usted presenta alguno de los siguientes problemas o situaciones: antecedentes de frecuencia cardiaca irregular enfermedad heptica recuentos sanguneos bajos, como baja cantidad de glbulos blancos, plaquetas o glbulos rojos enfermedad pulmonar o respiratoria, como asma hormigueo en las manos o los pies, u otro trastorno del sistema nervioso una reaccin alrgica o inusual al paclitaxel, al alcohol, al aceite de ricino polioxietilado, a otros medicamentos quimioteraputicos, a otros medicamentos, alimentos, colorantes o conservantes si est embarazada o buscando quedar embarazada si est amamantando a un beb Cmo debo [employer]? Este medicamento se administra como infusin en una vena. Un profesional de la salud especialmente capacitado lo administra en un hospital o clnica. Hable con su pediatra para informarse acerca del uso de este medicamento en nios. Puede requerir atencin especial. Sobredosis: Pngase en contacto inmediatamente con un centro toxicolgico o una sala de urgencia si usted cree que haya tomado demasiado medicamento. ATENCIN: [employer] es solo para usted. No comparta este medicamento con nadie. Qu sucede si me olvido de una dosis? Es importante no olvidar ninguna dosis. Informe a su mdico o a su  profesional de la salud si no puede asistir a Photographer. Qu puede interactuar con este medicamento? No use este medicamento con ninguno de los siguientes frmacos: vacunas de virus vivos Este medicamento tambin puede interactuar con los siguientes frmacos: medicamentos antivirales para la hepatitis, VIH o SIDA ciertos antibiticos, tales como eritromicina y Ambulance person ciertos medicamentos para infecciones micticas, tales como itraconazol y ketoconazol ciertos medicamentos para convulsiones, tales como [place], fenobarbital y fenitona gemfibrozil nefazodona rifampicina hierba de [place] Puede ser que esta lista no menciona todas las posibles interacciones. Informe a su profesional de [employer]	[place] de [employer] productos a base de hierbas, medicamentos de [place] o suplementos nutritivos que est tomando. Si usted fuma, consume bebidas alcohlicas o si utiliza drogas ilegales, indqueselo tambin a su profesional de [employer]	[place]. Algunas sustancias pueden interactuar con su medicamento. A qu debo estar atento al usar [employer]? Se supervisar su estado de salud atentamente mientras reciba este medicamento. Tendr que hacerse anlisis de sangre importantes mientras est usando este medicamento. Este medicamento puede causar Chief of Staff graves. Para reducir su riesgo, necesitar tomar otro(s) medicamento(s) antes del tratamiento con este medicamento. Si tiene [employer] erupcin cutnea, comezn/picazn o urticaria, hinchazn del rostro, los labios, o la [place], informe de inmediato a su mdico o profesional de Technical sales engineer. En algunos casos, podra recibir [employer] para ayudarlo con los efectos secundarios. Siga todas las instrucciones para usarlos. Este medicamento podra hacerle sentir  un Nurse, mental health. Esto es normal, ya que la quimioterapia puede afectar tanto a las clulas sanas como a las clulas cancerosas. Si presenta algn efecto  secundario, infrmelo. Contine con el tratamiento aun si se siente enfermo, a menos que su mdico le indique que lo suspenda. Llame a su mdico o a su profesional de la salud si tiene fiebre, escalofros o dolor de garganta, o cualquier otro sntoma de resfro o gripe. No se trate usted mismo. Este medicamento reduce la capacidad del cuerpo para combatir infecciones. Trate de no acercarse a personas que estn enfermas. Este medicamento podra aumentar el riesgo de moretones o sangrado. Consulte a su mdico o a su profesional de la salud si observa sangrados inusuales. Proceda con cuidado al cepillar sus dientes, usar hilo dental o Risk manager palillos para los dientes, ya que podra contraer una infeccin o Therapist, art con mayor facilidad. Si se somete a algn tratamiento dental, informe a su dentista que est [employer]. Evite usar productos que contienen aspirina, acetaminofeno, ibuprofeno, naproxeno o ketoprofeno, a menos que as lo indique su mdico. Estos productos pueden ocultar la fiebre. No debe quedar embarazada mientras est [employer]. Las mujeres deben informar a su mdico si estn buscando quedar embarazadas o si creen que podran estar embarazadas. Existe la posibilidad de efectos secundarios graves en un beb sin nacer. Para obtener ms informacin, hable con su profesional de la salud o su farmacutico. No debe Economist a un beb mientras est usando este medicamento. Para los hombres, se desaconseja concebir hijos mientras reciben Coca[employer]a. Este producto podra contener alcohol. Pregunte a Midwife o a su proveedor de atencin de la salud si este medicamento contiene alcohol. Asegrese de decirles a todos los proveedores de atencin de la salud que usted est tomando [place]. Ciertos medicamentos, como metronidazol y disulfiram, pueden causar una reaccin desagradable cuando se usan con alcohol. Esta reaccin incluye enrojecimiento, dolor de  cabeza, nuseas, vmitos, sudoracin y aumento de la sed. La reaccin puede durar de 30 minutos a varias horas. Qu efectos secundarios puedo tener al [employer] este medicamento? Efectos secundarios que debe informar a su mdico o a Barrister's clerk de la salud tan pronto como sea posible: Chief of Staff, tales como erupcin cutnea, comezn/picazn o urticaria, e hinchazn de la cara, los labios o la lengua problemas para respirar cambios en la visin frecuencia cardiaca rpida e irregular presin sangunea alta o baja llagas en la boca dolor, hormigueo o entumecimiento de las manos o los pies signos de disminucin en la cantidad de plaquetas o sangrado: moretones, puntos rojos en la piel, heces de color negro y aspecto alquitranado, sangre en la orina signos de disminucin en la cantidad de glbulos rojos: debilidad o cansancio inusuales, sensacin de Secondary school teacher o aturdimiento, cadas signos de infeccin: fiebre o escalofros, tos, dolor de garganta, dolor o dificultad para orinar signos y sntomas de lesin en el hgado, tales como orina amarilla oscura o [place]; sensacin general de estar enfermo o sntomas gripales; heces claras; prdida del apetito; nuseas; dolor en la regin abdominal superior derecha; debilidad o cansancio inusuales; color amarillento de los ojos o la piel hinchazn de tobillos, pies, manos frecuencia cardiaca inusualmente lenta Efectos secundarios que generalmente no requieren atencin mdica (infrmelos a su mdico o a su profesional de la salud si persisten o si son molestos): diarrea cada del cabello prdida del apetito dolores musculares o articulares nuseas, vmito dolor, enrojecimiento o irritacin en el lugar de la inyeccin cansancio Puede ser que  esta lista no menciona todos los posibles efectos secundarios. Comunquese a su mdico por asesoramiento mdico [employer]. Usted puede informar los efectos secundarios a la FDA por telfono  al 1-800-FDA-1088. Dnde debo guardar mi medicina? Este medicamento se administra en hospitales o clnicas, y no necesitar guardarlo en su domicilio. ATENCIN: Este folleto es un resumen. Puede ser que no cubra toda la posible informacin. Si usted tiene preguntas acerca de esta medicina, consulte con su mdico, su farmacutico o su profesional de Technical sales engineer.  2023 Elsevier/Gold Standard (2019-06-16 00:00:00)

## 2022-02-11 NOTE — Progress Notes (Signed)
Pt. started to cough and have a coughing spell towards the end of Taxol treatment. States she has had some coughing spells at home but also seems to happen every time she is here towards the end of Taxol treatment. Taxol completed, vital signs stable, and coughing stopped. Dr. Chryl Heck notified for possible need for premedication/medication adjustments.

## 2022-02-13 ENCOUNTER — Encounter: Payer: Self-pay | Admitting: Hematology and Oncology

## 2022-02-14 ENCOUNTER — Telehealth: Payer: Self-pay | Admitting: Hematology and Oncology

## 2022-02-14 MED FILL — Dexamethasone Sodium Phosphate Inj 100 MG/10ML: INTRAMUSCULAR | Qty: 1 | Status: AC

## 2022-02-14 NOTE — Telephone Encounter (Signed)
Contacted patient to scheduled appointments. Left message with appointment details and a call back number if patient had any questions or could not accommodate the time we provided.   

## 2022-02-16 ENCOUNTER — Other Ambulatory Visit: Payer: Self-pay

## 2022-02-18 ENCOUNTER — Other Ambulatory Visit: Payer: Self-pay

## 2022-02-18 ENCOUNTER — Inpatient Hospital Stay: Payer: Self-pay

## 2022-02-18 ENCOUNTER — Inpatient Hospital Stay (HOSPITAL_BASED_OUTPATIENT_CLINIC_OR_DEPARTMENT_OTHER): Payer: Self-pay | Admitting: Physician Assistant

## 2022-02-18 VITALS — BP 142/77 | HR 85 | Temp 97.7°F | Resp 16 | Wt 150.1 lb

## 2022-02-18 VITALS — BP 134/80 | HR 93 | Temp 98.4°F | Resp 18

## 2022-02-18 DIAGNOSIS — C50411 Malignant neoplasm of upper-outer quadrant of right female breast: Secondary | ICD-10-CM

## 2022-02-18 DIAGNOSIS — Z95828 Presence of other vascular implants and grafts: Secondary | ICD-10-CM

## 2022-02-18 LAB — CBC WITH DIFFERENTIAL (CANCER CENTER ONLY)
Abs Immature Granulocytes: 0.02 10*3/uL (ref 0.00–0.07)
Basophils Absolute: 0 10*3/uL (ref 0.0–0.1)
Basophils Relative: 0 %
Eosinophils Absolute: 0.2 10*3/uL (ref 0.0–0.5)
Eosinophils Relative: 3 %
HCT: 34 % — ABNORMAL LOW (ref 36.0–46.0)
Hemoglobin: 11 g/dL — ABNORMAL LOW (ref 12.0–15.0)
Immature Granulocytes: 0 %
Lymphocytes Relative: 16 %
Lymphs Abs: 0.9 10*3/uL (ref 0.7–4.0)
MCH: 28 pg (ref 26.0–34.0)
MCHC: 32.4 g/dL (ref 30.0–36.0)
MCV: 86.5 fL (ref 80.0–100.0)
Monocytes Absolute: 0.4 10*3/uL (ref 0.1–1.0)
Monocytes Relative: 7 %
Neutro Abs: 4.2 10*3/uL (ref 1.7–7.7)
Neutrophils Relative %: 74 %
Platelet Count: 347 10*3/uL (ref 150–400)
RBC: 3.93 MIL/uL (ref 3.87–5.11)
RDW: 14.6 % (ref 11.5–15.5)
WBC Count: 5.7 10*3/uL (ref 4.0–10.5)
nRBC: 0 % (ref 0.0–0.2)

## 2022-02-18 LAB — CMP (CANCER CENTER ONLY)
ALT: 28 U/L (ref 0–44)
AST: 25 U/L (ref 15–41)
Albumin: 4 g/dL (ref 3.5–5.0)
Alkaline Phosphatase: 56 U/L (ref 38–126)
Anion gap: 5 (ref 5–15)
BUN: 18 mg/dL (ref 6–20)
CO2: 28 mmol/L (ref 22–32)
Calcium: 9.1 mg/dL (ref 8.9–10.3)
Chloride: 104 mmol/L (ref 98–111)
Creatinine: 0.72 mg/dL (ref 0.44–1.00)
GFR, Estimated: >60 mL/min (ref >60)
Glucose, Bld: 99 mg/dL (ref 70–99)
Potassium: 3.7 mmol/L (ref 3.5–5.1)
Sodium: 137 mmol/L (ref 135–145)
Total Bilirubin: 0.3 mg/dL (ref 0.3–1.2)
Total Protein: 7 g/dL (ref 6.5–8.1)

## 2022-02-18 MED ORDER — SODIUM CHLORIDE 0.9 % IV SOLN
80.0000 mg/m2 | Freq: Once | INTRAVENOUS | Status: AC
  Administered 2022-02-18: 138 mg via INTRAVENOUS
  Filled 2022-02-18: qty 23

## 2022-02-18 MED ORDER — SODIUM CHLORIDE 0.9 % IV SOLN: Freq: Once | INTRAVENOUS | Status: AC

## 2022-02-18 MED ORDER — HEPARIN SOD (PORK) LOCK FLUSH 100 UNIT/ML IV SOLN
500.0000 [IU] | Freq: Once | INTRAVENOUS | Status: AC | PRN
  Administered 2022-02-18: 500 [IU]

## 2022-02-18 MED ORDER — FAMOTIDINE IN NACL 20-0.9 MG/50ML-% IV SOLN
20.0000 mg | Freq: Once | INTRAVENOUS | Status: AC
  Administered 2022-02-18: 20 mg via INTRAVENOUS
  Filled 2022-02-18: qty 50

## 2022-02-18 MED ORDER — CETIRIZINE HCL 10 MG/ML IV SOLN
10.0000 mg | Freq: Once | INTRAVENOUS | Status: AC
  Administered 2022-02-18: 10 mg via INTRAVENOUS
  Filled 2022-02-18: qty 1

## 2022-02-18 MED ORDER — SODIUM CHLORIDE 0.9% FLUSH
10.0000 mL | INTRAVENOUS | Status: DC | PRN
  Administered 2022-02-18: 10 mL

## 2022-02-18 MED ORDER — SODIUM CHLORIDE 0.9% FLUSH
10.0000 mL | Freq: Once | INTRAVENOUS | Status: AC
  Administered 2022-02-18: 10 mL via INTRAVENOUS

## 2022-02-18 MED ORDER — SODIUM CHLORIDE 0.9 % IV SOLN
10.0000 mg | Freq: Once | INTRAVENOUS | Status: AC
  Administered 2022-02-18: 10 mg via INTRAVENOUS
  Filled 2022-02-18: qty 10

## 2022-02-18 NOTE — Progress Notes (Signed)
At the very end of Taxol infusion and during flush, patient had mild coughing. Per patient, her coughing today was less than it has been in the past- patient received extra premedication of pepcid today. Vitals stable and patient in no distress upon leaving infusion room.

## 2022-02-18 NOTE — Patient Instructions (Signed)
Instrucciones al darle de alta: Discharge Instructions Gracias por elegir al [facility] de Cncer de [place] para brindarle atencin mdica de oncologa y Music therapist.   Si usted tiene una cita de laboratorio con [place], por favor vaya directamente [place] y regstrese en el rea de Control and instrumentation engineer.   Use ropa cmoda y [place] para tener fcil acceso a las vas del Portacath (acceso venoso de Engineer, site duracin) o la lnea PICC (catter central colocado por va perifrica).   Nos esforzamos por ofrecerle tiempo de calidad con su proveedor. Es posible que tenga que volver a programar su cita si llega tarde (15 minutos o ms).  El llegar tarde le afecta a usted y a otros pacientes cuyas citas son posteriores a Merchandiser, retail.  Adems, si usted falta a tres o ms citas sin avisar a la oficina, puede ser retirado(a) de la clnica a discrecin del proveedor.      Para las solicitudes de renovacin de recetas, pida a su farmacia que se ponga en contacto con nuestra oficina y deje que transcurran 21 horas para que se complete el proceso de las renovaciones.    Hoy usted recibi los siguientes agentes de quimioterapia e/o inmunoterapia: Paclitaxel (Taxol)   Para ayudar a prevenir las nuseas y los vmitos despus de su tratamiento, le recomendamos que tome su medicamento para las nuseas segn las indicaciones.  LOS SNTOMAS QUE DEBEN COMUNICARSE INMEDIATAMENTE SE INDICAN A CONTINUACIN: *FIEBRE SUPERIOR A 100.4 F (38 C) O MS *ESCALOFROS O SUDORACIN *NUSEAS Y VMITOS QUE NO SE CONTROLAN CON EL MEDICAMENTO PARA LAS NUSEAS *DIFICULTAD INUSUAL PARA RESPIRAR  *MORETONES O HEMORRAGIAS NO HABITUALES *PROBLEMAS URINARIOS (dolor o ardor al Garment/textile technologist o frecuencia para Garment/textile technologist) *PROBLEMAS INTESTINALES (diarrea inusual, estreimiento, dolor cerca del ano) SENSIBILIDAD EN LA BOCA Y EN LA GARGANTA CON O SIN LA PRESENCIA DE LCERAS (dolor de garganta, llagas en la boca o dolor de  muelas/dientes) ERUPCIN, HINCHAZN O DOLORES INUSUALES FLUJO VAGINAL INUSUAL O PICAZN/RASQUIA    Los puntos marcados con un asterisco ( *) indican una posible emergencia y debe hacer un seguimiento tan pronto como le sea posible o vaya al Departamento de Emergencias si se le presenta algn problema.  Por favor, muestre la [place] DE ADVERTENCIA DE Windy Canny DE ADVERTENCIA DE Benay Spice al registrarse en [address] de Emergencias y a la enfermera de triaje.  Si tiene preguntas despus de su visita o necesita cancelar o volver a programar su cita, por favor pngase en contacto con [place]  Dept: [phone] y [place]. Las horas de oficina son de 8:00 a.m. a 4:30 p.m. de lunes a viernes. Por favor, tenga en cuenta que los mensajes de voz que se dejan despus de las 4:00 p.m. posiblemente no se devolvern hasta el siguiente da de [place].  Cerramos los fines de semana y [employer]. En todo momento tiene acceso a una enfermera para preguntas urgentes. Por favor, llame al nmero principal de la clnica Dept: [phone] y [place] instrucciones.   Para cualquier pregunta que no sea de carcter urgente, tambin puede ponerse en contacto con su proveedor [employer]. Ahora ofrecemos visitas electrnicas para cualquier persona mayor de 18 aos que solicite atencin mdica en lnea para los sntomas que no sean urgentes. Para ms detalles vaya a [email or web address].   Tambin puede bajar la aplicacin de MyChart! Vaya a la tienda de aplicaciones, busque "MyChart", abra la aplicacin, seleccione [employer],  e ingrese con su nombre de usuario y la contrasea de Pharmacist, community.  Las mscaras son opcionales en los centros de Hotel manager. Si desea que su equipo de cuidados mdicos use una [employer] atienden, por favor hgaselo saber al personal. Hannah Murray una persona de apoyo que tenga por lo menos 16 aos  para que le acompae a sus citas. Paclitaxel Injection Qu es este medicamento? El PACLITAXEL es un agente quimioteraputico. Este medicamento acta sobre las clulas que se dividen rpidamente, como las clulas cancerosas, y finalmente provoca la muerte de estas clulas. Se utiliza en el tratamiento del cncer de ovario, mama, pulmn, sarcoma de [place] y otros tipos de cncer. Este medicamento puede ser utilizado para otros usos; si tiene alguna pregunta consulte con su proveedor de atencin mdica o con su farmacutico. MARCAS COMUNES: Onxol, Taxol Qu le debo informar a mi profesional de la salud antes de tomar este medicamento? Necesitan saber si usted presenta alguno de los siguientes problemas o situaciones: antecedentes de frecuencia cardiaca irregular enfermedad heptica recuentos sanguneos bajos, como baja cantidad de glbulos blancos, plaquetas o glbulos rojos enfermedad pulmonar o respiratoria, como asma hormigueo en las manos o los pies, u otro trastorno del sistema nervioso una reaccin alrgica o inusual al paclitaxel, al alcohol, al aceite de ricino polioxietilado, a otros medicamentos quimioteraputicos, a otros medicamentos, alimentos, colorantes o conservantes si est embarazada o buscando quedar embarazada si est amamantando a un beb Cmo debo [employer]? Este medicamento se administra como infusin en una vena. Un profesional de la salud especialmente capacitado lo administra en un hospital o clnica. Hable con su pediatra para informarse acerca del uso de este medicamento en nios. Puede requerir atencin especial. Sobredosis: Pngase en contacto inmediatamente con un centro toxicolgico o una sala de urgencia si usted cree que haya tomado demasiado medicamento. ATENCIN: [employer] es solo para usted. No comparta este medicamento con nadie. Qu sucede si me olvido de una dosis? Es importante no olvidar ninguna dosis. Informe a su mdico o a su  profesional de la salud si no puede asistir a Photographer. Qu puede interactuar con este medicamento? No use este medicamento con ninguno de los siguientes frmacos: vacunas de virus vivos Este medicamento tambin puede interactuar con los siguientes frmacos: medicamentos antivirales para la hepatitis, VIH o SIDA ciertos antibiticos, tales como eritromicina y Ambulance person ciertos medicamentos para infecciones micticas, tales como itraconazol y ketoconazol ciertos medicamentos para convulsiones, tales como [place], fenobarbital y fenitona gemfibrozil nefazodona rifampicina hierba de [place] Puede ser que esta lista no menciona todas las posibles interacciones. Informe a su profesional de [employer]	[place] de [employer] productos a base de hierbas, medicamentos de [place] o suplementos nutritivos que est tomando. Si usted fuma, consume bebidas alcohlicas o si utiliza drogas ilegales, indqueselo tambin a su profesional de [employer]	[place]. Algunas sustancias pueden interactuar con su medicamento. A qu debo estar atento al usar Coca[employer]a? Se supervisar su estado de salud atentamente mientras reciba este medicamento. Tendr que hacerse anlisis de sangre importantes mientras est usando este medicamento. Este medicamento puede causar Chief of Staff graves. Para reducir su riesgo, necesitar tomar otro(s) medicamento(s) antes del tratamiento con este medicamento. Si tiene [employer] Corporation erupcin cutnea, comezn/picazn o urticaria, hinchazn del rostro, los labios, o la [place], informe de inmediato a su mdico o profesional de Technical sales engineer. En algunos casos, podra recibir [employer]ands para ayudarlo con los efectos secundarios. Siga todas las instrucciones para usarlos. Este medicamento podra hacerle sentir  un Nurse, mental health. Esto es normal, ya que la quimioterapia puede afectar tanto a las clulas sanas como a las clulas cancerosas. Si presenta algn efecto  secundario, infrmelo. Contine con el tratamiento aun si se siente enfermo, a menos que su mdico le indique que lo suspenda. Llame a su mdico o a su profesional de la salud si tiene fiebre, escalofros o dolor de garganta, o cualquier otro sntoma de resfro o gripe. No se trate usted mismo. Este medicamento reduce la capacidad del cuerpo para combatir infecciones. Trate de no acercarse a personas que estn enfermas. Este medicamento podra aumentar el riesgo de moretones o sangrado. Consulte a su mdico o a su profesional de la salud si observa sangrados inusuales. Proceda con cuidado al cepillar sus dientes, usar hilo dental o Risk manager palillos para los dientes, ya que podra contraer una infeccin o Therapist, art con mayor facilidad. Si se somete a algn tratamiento dental, informe a su dentista que est [employer]. Evite usar productos que contienen aspirina, acetaminofeno, ibuprofeno, naproxeno o ketoprofeno, a menos que as lo indique su mdico. Estos productos pueden ocultar la fiebre. No debe quedar embarazada mientras est [employer]. Las mujeres deben informar a su mdico si estn buscando quedar embarazadas o si creen que podran estar embarazadas. Existe la posibilidad de efectos secundarios graves en un beb sin nacer. Para obtener ms informacin, hable con su profesional de la salud o su farmacutico. No debe Economist a un beb mientras est usando este medicamento. Para los hombres, se desaconseja concebir hijos mientras reciben Coca-Cola. Este producto podra contener alcohol. Pregunte a Midwife o a su proveedor de atencin de la salud si este medicamento contiene alcohol. Asegrese de decirles a todos los proveedores de atencin de la salud que usted est tomando [place]. Ciertos medicamentos, como metronidazol y disulfiram, pueden causar una reaccin desagradable cuando se usan con alcohol. Esta reaccin incluye enrojecimiento, dolor de  cabeza, nuseas, vmitos, sudoracin y aumento de la sed. La reaccin puede durar de 30 minutos a varias horas. Qu efectos secundarios puedo tener al [employer] este medicamento? Efectos secundarios que debe informar a su mdico o a Barrister's clerk de la salud tan pronto como sea posible: Chief of Staff, tales como erupcin cutnea, comezn/picazn o urticaria, e hinchazn de la cara, los labios o la lengua problemas para respirar cambios en la visin frecuencia cardiaca rpida e irregular presin sangunea alta o baja llagas en la boca dolor, hormigueo o entumecimiento de las manos o los pies signos de disminucin en la cantidad de plaquetas o sangrado: moretones, puntos rojos en la piel, heces de color negro y aspecto alquitranado, sangre en la orina signos de disminucin en la cantidad de glbulos rojos: debilidad o cansancio inusuales, sensacin de Secondary school teacher o aturdimiento, cadas signos de infeccin: fiebre o escalofros, tos, dolor de garganta, dolor o dificultad para orinar signos y sntomas de lesin en el hgado, tales como orina amarilla oscura o [place]; sensacin general de estar enfermo o sntomas gripales; heces claras; prdida del apetito; nuseas; dolor en la regin abdominal superior derecha; debilidad o cansancio inusuales; color amarillento de los ojos o la piel hinchazn de tobillos, pies, manos frecuencia cardiaca inusualmente lenta Efectos secundarios que generalmente no requieren atencin mdica (infrmelos a su mdico o a su profesional de la salud si persisten o si son molestos): diarrea cada del cabello prdida del apetito dolores musculares o articulares nuseas, vmito dolor, enrojecimiento o irritacin en el lugar de la inyeccin cansancio Puede ser que  esta lista no menciona todos los posibles efectos secundarios. Comunquese a su mdico por asesoramiento mdico [employer] Inc. Usted puede informar los efectos secundarios a la FDA por telfono  al 1-800-FDA-1088. Dnde debo guardar mi medicina? Este medicamento se administra en hospitales o clnicas, y no necesitar guardarlo en su domicilio. ATENCIN: Este folleto es un resumen. Puede ser que no cubra toda la posible informacin. Si usted tiene preguntas acerca de esta medicina, consulte con su mdico, su farmacutico o su profesional de Technical sales engineer.  2023 Elsevier/Gold Standard (2019-06-16 00:00:00)

## 2022-02-18 NOTE — Progress Notes (Signed)
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 Indeterminate mass in the right breast at 12:30, which may represent a fibroadenoma. No evidence of right axillary adenopathy.   05/16/2021 Pathology Results   Right breast 10 0 clock mass biopsy showed invasive mammary carcinoma, ductal phenotype prognostics include ER 95% strong staining intensity PR 95% strong staining intensity Ki-67 of 40% and HER2 negative   06/11/2021 Cancer Staging   Staging form: Breast, AJCC 8th Edition - Pathologic: Stage IIA (pT2, pN1a, cM0, G3, ER+, PR+, HER2-, Oncotype DX score: 24) - Signed by [name], MD on 11/04/2021 Multigene prognostic tests performed: Oncotype DX Recurrence score range: Greater than or equal to 11 Histologic grading system: 3 grade system   07/10/2021 Genetic Testing   Negative hereditary cancer genetic testing: no pathogenic variants detected [facility] Breast STAT Panel or Common Hereditary Cancers +RNA Panel.  Report dates are Jul 10, 2021 and Jul 18, 2021.   The STAT Breast cancer panel offered by [facility] includes sequencing and rearrangement analysis for the following 9 genes:  ATM, BRCA1, BRCA2, CDH1, CHEK2, PALB2, PTEN, STK11 and TP53.   The Common  Hereditary Cancers + RNA Panel offered by [facility] includes sequencing, deletion/duplication, and RNA testing of the following 47 genes: APC, ATM, AXIN2, BARD1, BMPR1A, BRCA1, BRCA2, BRIP1, CDH1, CDK4*, CDKN2A (p14ARF)*, CDKN2A (p16INK4a)*, CHEK2, CTNNA1, DICER1, EPCAM (Deletion/duplication testing only), GREM1 (promoter region deletion/duplication testing only), KIT, MEN1, MLH1, MSH2, MSH3, MSH6, MUTYH, NBN, NF1, NHTL1, PALB2, PDGFRA*, PMS2, POLD1, POLE, PTEN, RAD50, RAD51C, RAD51D, SDHB, SDHC, SDHD, SMAD4, SMARCA4. STK11, TP53, TSC1, TSC2, and VHL.  The following genes were evaluated for sequence changes only: SDHA and HOXB13 c.251G>A variant only.  RNA analysis is not performed for the * genes.     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  Right axillary lymph node evaluation showed 1 out of 8 lymph nodes positive for malignancy., final pathologic staging T2N1A.    11/11/2021 -  Chemotherapy   Patient is on Treatment Plan : BREAST ADJUVANT DOSE DENSE AC q14d / PACLitaxel q7d       CURRENT THERAPY: Cycle 11 of weekly Taxol  INTERVAL HISTORY: Hannah Murray 46 y.o. female returns for f/u prior to receiving her weekly taxol.  She is accompanied by a [place] interpretor.   Ms. Ackroyd reports the continues to tolerate chemotherapy very well except for a dry cough she experiences towards the end of her infusion. She denies any shortness of breath or chest pain with the cough. She denies any coughing episodes at home. She is otherwise feeling well without any fatigue. She is  eating well and denies any weight loss. She denies nausea, vomiting or abdominal pain. Her bowel habits are unchanged without any recurrent episodes of diarrhea or constipation. She denies easy bruising or signs of active bleeding. She denies fevers, chills,  sweats, headaches, dizziness, edema or residual neuropathy. She has no other complaints. Rest of the ROS is reviewed and negative.   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  MEDICAL HISTORY: Past Medical History:  Diagnosis Date   Medical history non-contributory     SURGICAL HISTORY: Past Surgical History:  Procedure Laterality Date   BREAST RECONSTRUCTION WITH PLACEMENT OF TISSUE EXPANDER AND FLEX HD (ACELLULAR HYDRATED DERMIS) Right 09/30/2021   Procedure: BREAST RECONSTRUCTION WITH PLACEMENT OF TISSUE EXPANDER AND FLEX HD (ACELLULAR HYDRATED DERMIS);  Surgeon: Wallace Going, DO;  Location: [facility];  Service: Plastics;  Laterality: Right;   MASTECTOMY W/ SENTINEL NODE BIOPSY Right 09/30/2021   Procedure: RIGHT MASTECTOMY WITH SENTINEL LYMPH NODE BIOPSY;  Surgeon: Jovita Kussmaul, MD;  Location: [facility];  Service: General;  Laterality: Right;   PORTACATH PLACEMENT Left 11/08/2021   Procedure: INSERTION PORT-A-CATH;  Surgeon: Jovita Kussmaul, MD;  Location: [facility];  Service: General;  Laterality: Left;    SOCIAL HISTORY: Social History   Socioeconomic History   Marital status: Married    Spouse name: Not on file   Number of children: 1   Years of education: Not on file   Highest education level: High school graduate  Occupational History   Not on file  Tobacco Use   Smoking status: Never   Smokeless tobacco: Never  Vaping Use   Vaping Use: Never used  Substance and Sexual Activity   Alcohol use: Yes    Comment: occasionally   Drug use: Never   Sexual activity: Not Currently  Other Topics Concern   Not on file  Social History Narrative   Not on file   Social Determinants of Health   Financial Resource Strain: Not on file  Food  Insecurity: No Food Insecurity (04/30/2021)   Hunger Vital Sign    Worried About Running Out of Food in the Last Year: Never true    Ran Out of Food in the Last Year: Never true  Transportation Needs: No Transportation Needs (04/30/2021)   PRAPARE - Hydrologist (Medical): No    Lack of Transportation (Non-Medical): No  Physical Activity: Not on file  Stress: Not on file  Social Connections: Not on file  Intimate Partner Violence: Not At Risk (07/02/2021)   Humiliation, Afraid, Rape, and Kick questionnaire    Fear of Current or Ex-Partner: No    Emotionally Abused: No    Physically Abused: No    Sexually Abused: No    FAMILY HISTORY: Family History  Problem Relation Age of Onset   Hypertension Father    Diabetes Father    Thyroid disease Father    Ovarian cancer Other        MGM's niece; d. > 50    PHYSICAL EXAMINATION  ECOG PERFORMANCE STATUS: 1 - Symptomatic but completely ambulatory  Vitals:   02/18/22 1340  BP: (!) 142/77  Pulse: 85  Resp: 16  Temp: 97.7 F (36.5 C)  SpO2: 98%    Physical Exam  Constitutional:      General: She is not in acute distress.    Appearance: Normal appearance. She is not toxic-appearing.  HENT:     Head: Normocephalic and atraumatic.  Eyes:     General: No scleral icterus. Cardiovascular:     Rate and Rhythm: Normal rate and regular rhythm.     Pulses: Normal pulses.     Heart sounds: Normal heart sounds.  Pulmonary:     Effort: Pulmonary effort is normal.     Breath sounds: Normal breath sounds.  Abdominal:     General: Bowel sounds are normal. There is no distension.     Palpations: Abdomen is soft.     Tenderness: There is no abdominal tenderness.  Musculoskeletal:        General: No swelling.     Cervical back: Neck supple.  Lymphadenopathy:     Cervical: No cervical adenopathy.  Skin:    General: Skin is warm and dry.     Findings: No rash.  Neurological:     General: No focal deficit  present.     Mental Status: She is alert.  Psychiatric:        Mood and Affect: Mood normal.        Behavior: Behavior normal.     LABORATORY DATA:  CBC    Component Value Date/Time   WBC 5.7 02/18/2022 1257   WBC 9.3 12/20/2021 0900   RBC 3.93 02/18/2022 1257   HGB 11.0 (L) 02/18/2022 1257   HCT 34.0 (L) 02/18/2022 1257   PLT 347 02/18/2022 1257   MCV 86.5 02/18/2022 1257   MCH 28.0 02/18/2022 1257   MCHC 32.4 02/18/2022 1257   RDW 14.6 02/18/2022 1257   LYMPHSABS 0.9 02/18/2022 1257   MONOABS 0.4 02/18/2022 1257   EOSABS 0.2 02/18/2022 1257   BASOSABS 0.0 02/18/2022 1257    CMP     Component Value Date/Time   NA 137 02/18/2022 1257   K 3.7 02/18/2022 1257   CL 104 02/18/2022 1257   CO2 28 02/18/2022 1257   GLUCOSE 99 02/18/2022 1257   BUN 18 02/18/2022 1257   CREATININE 0.72 02/18/2022 1257   CALCIUM 9.1 02/18/2022 1257   PROT 7.0 02/18/2022 1257   ALBUMIN 4.0 02/18/2022 1257   AST 25 02/18/2022 1257   ALT 28 02/18/2022 1257   ALKPHOS 56 02/18/2022 1257   BILITOT 0.3 02/18/2022 1257   GFRNONAA >60 02/18/2022 1257    ASSESSMENT and PLAN:  Hannah Murray is a 46 y.o. female who returns for a follow up for right breast cancer.   #Right breast intraductal carcinoma: --ER/PR positive, HER2 negative, clinically stage T2 N0 M0 grade 2/3  --Underwent right mastectomy on 09/30/2021 which showed grade 3 invasive ductal carcinoma measuring 4.3 cm in greatest dimension, margins negative along with solid type DCIS high-grade. Right axillary lymph node evaluation showed 1 out of 8 lymph nodes positive for malignancy.  --Oncotype test resulted at 24, benefit of chemotherapy cannot be excluded.  --Recommendation was adjuvant dose dense adriamycin and cytoxan q 14 days x 4 cycles followed by weekly taxol q 7 days x 12 cycles. --Patient completed adjuvant AC chemotherapy from 11/06/2021-12/23/2021.  --She started adjuvant weekly taxol on 01/06/2022.  --Labs from today were  reviewed and adequate for treatment. Mild, stable anemia with Hgb 11.0. No other cytopenias. CMP was unremarkable. No prohibitive toxicities so okay to continue without any dose modifications --Continue with weekly labs and taxol chemotherapy. RTC in 2 weeks for  toxicity check  #Neuropathy: --Very mild and only present during infusion. No residual neuropathy and no interference with grip/balance.  --Monitor for now.    No problem-specific Assessment & Plan notes found for this encounter.    All questions were answered. The patient knows to call the clinic with any problems, questions or concerns. We can certainly see the patient much sooner if necessary.  I have spent a total of 30 minutes minutes of face-to-face and non-face-to-face time, preparing to see the patient, performing a medically appropriate examination, counseling and educating the patient, documenting clinical information in the electronic health record,  and care coordination.   Dede Query PA-C Dept of Hematology and [place] at [facility] Phone: [phone]

## 2022-02-19 ENCOUNTER — Other Ambulatory Visit: Payer: Self-pay

## 2022-02-20 ENCOUNTER — Encounter: Payer: Self-pay | Admitting: *Deleted

## 2022-02-21 ENCOUNTER — Encounter: Payer: Self-pay | Admitting: Hematology and Oncology

## 2022-02-21 MED FILL — Dexamethasone Sodium Phosphate Inj 100 MG/10ML: INTRAMUSCULAR | Qty: 1 | Status: AC

## 2022-02-25 ENCOUNTER — Inpatient Hospital Stay: Payer: Self-pay | Attending: Hematology and Oncology

## 2022-02-25 ENCOUNTER — Other Ambulatory Visit: Payer: Self-pay

## 2022-02-25 ENCOUNTER — Inpatient Hospital Stay: Payer: Self-pay

## 2022-02-25 VITALS — BP 134/76 | HR 76 | Temp 97.8°F | Resp 18 | Wt 150.2 lb

## 2022-02-25 DIAGNOSIS — C50411 Malignant neoplasm of upper-outer quadrant of right female breast: Secondary | ICD-10-CM | POA: Insufficient documentation

## 2022-02-25 DIAGNOSIS — Z170 Estrogen receptor positive status [ER+]: Secondary | ICD-10-CM | POA: Insufficient documentation

## 2022-02-25 DIAGNOSIS — D649 Anemia, unspecified: Secondary | ICD-10-CM | POA: Insufficient documentation

## 2022-02-25 DIAGNOSIS — Z5111 Encounter for antineoplastic chemotherapy: Secondary | ICD-10-CM | POA: Insufficient documentation

## 2022-02-25 DIAGNOSIS — Z95828 Presence of other vascular implants and grafts: Secondary | ICD-10-CM

## 2022-02-25 LAB — CBC WITH DIFFERENTIAL (CANCER CENTER ONLY)
Abs Immature Granulocytes: 0.03 10*3/uL (ref 0.00–0.07)
Basophils Absolute: 0 10*3/uL (ref 0.0–0.1)
Basophils Relative: 1 %
Eosinophils Absolute: 0.2 10*3/uL (ref 0.0–0.5)
Eosinophils Relative: 4 %
HCT: 35.6 % — ABNORMAL LOW (ref 36.0–46.0)
Hemoglobin: 11.6 g/dL — ABNORMAL LOW (ref 12.0–15.0)
Immature Granulocytes: 1 %
Lymphocytes Relative: 18 %
Lymphs Abs: 0.6 10*3/uL — ABNORMAL LOW (ref 0.7–4.0)
MCH: 28.4 pg (ref 26.0–34.0)
MCHC: 32.6 g/dL (ref 30.0–36.0)
MCV: 87.3 fL (ref 80.0–100.0)
Monocytes Absolute: 0.2 10*3/uL (ref 0.1–1.0)
Monocytes Relative: 7 %
Neutro Abs: 2.5 10*3/uL (ref 1.7–7.7)
Neutrophils Relative %: 69 %
Platelet Count: 304 10*3/uL (ref 150–400)
RBC: 4.08 MIL/uL (ref 3.87–5.11)
RDW: 14.2 % (ref 11.5–15.5)
WBC Count: 3.5 10*3/uL — ABNORMAL LOW (ref 4.0–10.5)
nRBC: 0 % (ref 0.0–0.2)

## 2022-02-25 LAB — CMP (CANCER CENTER ONLY)
ALT: 23 U/L (ref 0–44)
AST: 19 U/L (ref 15–41)
Albumin: 3.9 g/dL (ref 3.5–5.0)
Alkaline Phosphatase: 47 U/L (ref 38–126)
Anion gap: 4 — ABNORMAL LOW (ref 5–15)
BUN: 10 mg/dL (ref 6–20)
CO2: 27 mmol/L (ref 22–32)
Calcium: 9.2 mg/dL (ref 8.9–10.3)
Chloride: 106 mmol/L (ref 98–111)
Creatinine: 0.55 mg/dL (ref 0.44–1.00)
GFR, Estimated: >60 mL/min (ref >60)
Glucose, Bld: 86 mg/dL (ref 70–99)
Potassium: 3.9 mmol/L (ref 3.5–5.1)
Sodium: 137 mmol/L (ref 135–145)
Total Bilirubin: 0.4 mg/dL (ref 0.3–1.2)
Total Protein: 6.9 g/dL (ref 6.5–8.1)

## 2022-02-25 MED ORDER — SODIUM CHLORIDE 0.9% FLUSH
10.0000 mL | INTRAVENOUS | Status: DC | PRN
  Administered 2022-02-25: 10 mL

## 2022-02-25 MED ORDER — SODIUM CHLORIDE 0.9 % IV SOLN
80.0000 mg/m2 | Freq: Once | INTRAVENOUS | Status: AC
  Administered 2022-02-25: 138 mg via INTRAVENOUS
  Filled 2022-02-25: qty 23

## 2022-02-25 MED ORDER — SODIUM CHLORIDE 0.9 % IV SOLN
10.0000 mg | Freq: Once | INTRAVENOUS | Status: AC
  Administered 2022-02-25: 10 mg via INTRAVENOUS
  Filled 2022-02-25: qty 10

## 2022-02-25 MED ORDER — HEPARIN SOD (PORK) LOCK FLUSH 100 UNIT/ML IV SOLN
500.0000 [IU] | Freq: Once | INTRAVENOUS | Status: AC | PRN
  Administered 2022-02-25: 500 [IU]

## 2022-02-25 MED ORDER — FAMOTIDINE IN NACL 20-0.9 MG/50ML-% IV SOLN
20.0000 mg | Freq: Once | INTRAVENOUS | Status: AC
  Administered 2022-02-25: 20 mg via INTRAVENOUS
  Filled 2022-02-25: qty 50

## 2022-02-25 MED ORDER — SODIUM CHLORIDE 0.9 % IV SOLN: Freq: Once | INTRAVENOUS | Status: AC

## 2022-02-25 MED ORDER — SODIUM CHLORIDE 0.9% FLUSH
10.0000 mL | INTRAVENOUS | Status: AC | PRN
  Administered 2022-02-25: 10 mL

## 2022-02-25 MED ORDER — CETIRIZINE HCL 10 MG/ML IV SOLN
10.0000 mg | Freq: Once | INTRAVENOUS | Status: AC
  Administered 2022-02-25: 10 mg via INTRAVENOUS
  Filled 2022-02-25: qty 1

## 2022-02-25 NOTE — Patient Instructions (Signed)
Instrucciones al darle de alta: Discharge Instructions Gracias por elegir al [facility] de Cncer de [place] para brindarle atencin mdica de oncologa y Music therapist.   Si usted tiene una cita de laboratorio con [place], por favor vaya directamente [place] y regstrese en el rea de Control and instrumentation engineer.   Use ropa cmoda y [place] para tener fcil acceso a las vas del Portacath (acceso venoso de Engineer, site duracin) o la lnea PICC (catter central colocado por va perifrica).   Nos esforzamos por ofrecerle tiempo de calidad con su proveedor. Es posible que tenga que volver a programar su cita si llega tarde (15 minutos o ms).  El llegar tarde le afecta a usted y a otros pacientes cuyas citas son posteriores a Merchandiser, retail.  Adems, si usted falta a tres o ms citas sin avisar a la oficina, puede ser retirado(a) de la clnica a discrecin del proveedor.      Para las solicitudes de renovacin de recetas, pida a su farmacia que se ponga en contacto con nuestra oficina y deje que transcurran 64 horas para que se complete el proceso de las renovaciones.    Hoy usted recibi los siguientes agentes de quimioterapia e/o inmunoterapia: Paclitaxel (Taxol)   Para ayudar a prevenir las nuseas y los vmitos despus de su tratamiento, le recomendamos que tome su medicamento para las nuseas segn las indicaciones.  LOS SNTOMAS QUE DEBEN COMUNICARSE INMEDIATAMENTE SE INDICAN A CONTINUACIN: *FIEBRE SUPERIOR A 100.4 F (38 C) O MS *ESCALOFROS O SUDORACIN *NUSEAS Y VMITOS QUE NO SE CONTROLAN CON EL MEDICAMENTO PARA LAS NUSEAS *DIFICULTAD INUSUAL PARA RESPIRAR  *MORETONES O HEMORRAGIAS NO HABITUALES *PROBLEMAS URINARIOS (dolor o ardor al Garment/textile technologist o frecuencia para Garment/textile technologist) *PROBLEMAS INTESTINALES (diarrea inusual, estreimiento, dolor cerca del ano) SENSIBILIDAD EN LA BOCA Y EN LA GARGANTA CON O SIN LA PRESENCIA DE LCERAS (dolor de garganta, llagas en la boca o dolor de  muelas/dientes) ERUPCIN, HINCHAZN O DOLORES INUSUALES FLUJO VAGINAL INUSUAL O PICAZN/RASQUIA    Los puntos marcados con un asterisco ( *) indican una posible emergencia y debe hacer un seguimiento tan pronto como le sea posible o vaya al Departamento de Emergencias si se le presenta algn problema.  Por favor, muestre la [place] DE ADVERTENCIA DE Windy Canny DE ADVERTENCIA DE Benay Spice al registrarse en [address] de Emergencias y a la enfermera de triaje.  Si tiene preguntas despus de su visita o necesita cancelar o volver a programar su cita, por favor pngase en contacto con [place]  Dept: [phone] y [place]. Las horas de oficina son de 8:00 a.m. a 4:30 p.m. de lunes a viernes. Por favor, tenga en cuenta que los mensajes de voz que se dejan despus de las 4:00 p.m. posiblemente no se devolvern hasta el siguiente da de [place].  Cerramos los fines de semana y [employer]. En todo momento tiene acceso a una enfermera para preguntas urgentes. Por favor, llame al nmero principal de la clnica Dept: [phone] y [place] instrucciones.   Para cualquier pregunta que no sea de carcter urgente, tambin puede ponerse en contacto con su proveedor [employer]. Ahora ofrecemos visitas electrnicas para cualquier persona mayor de 18 aos que solicite atencin mdica en lnea para los sntomas que no sean urgentes. Para ms detalles vaya a mychart.[email or web address].   Tambin puede bajar la aplicacin de MyChart! Vaya a la tienda de aplicaciones, busque "MyChart", abra la aplicacin, seleccione [employer],  e ingrese con su nombre de usuario y la contrasea de Pharmacist, community.  Las mscaras son opcionales en los centros de Hotel manager. Si desea que su equipo de cuidados mdicos use una [employer] atienden, por favor hgaselo saber al personal. Bethann Berkshire una persona de apoyo que tenga por lo menos 16 aos  para que le acompae a sus citas. Paclitaxel Injection Qu es este medicamento? El PACLITAXEL es un agente quimioteraputico. Este medicamento acta sobre las clulas que se dividen rpidamente, como las clulas cancerosas, y finalmente provoca la muerte de estas clulas. Se utiliza en el tratamiento del cncer de ovario, mama, pulmn, sarcoma de Kaposi y otros tipos de cncer. Este medicamento puede ser utilizado para otros usos; si tiene alguna pregunta consulte con su proveedor de atencin mdica o con su farmacutico. MARCAS COMUNES: Onxol, Taxol Qu le debo informar a mi profesional de la salud antes de tomar este medicamento? Necesitan saber si usted presenta alguno de los siguientes problemas o situaciones: antecedentes de frecuencia cardiaca irregular enfermedad heptica recuentos sanguneos bajos, como baja cantidad de glbulos blancos, plaquetas o glbulos rojos enfermedad pulmonar o respiratoria, como asma hormigueo en las manos o los pies, u otro trastorno del sistema nervioso una reaccin alrgica o inusual al paclitaxel, al alcohol, al aceite de ricino polioxietilado, a otros medicamentos quimioteraputicos, a otros medicamentos, alimentos, colorantes o conservantes si est embarazada o buscando quedar embarazada si est amamantando a un beb Cmo debo [employer]? Este medicamento se administra como infusin en una vena. Un profesional de la salud especialmente capacitado lo administra en un hospital o clnica. Hable con su pediatra para informarse acerca del uso de este medicamento en nios. Puede requerir atencin especial. Sobredosis: Pngase en contacto inmediatamente con un centro toxicolgico o una sala de urgencia si usted cree que haya tomado demasiado medicamento. ATENCIN: [employer] es solo para usted. No comparta este medicamento con nadie. Qu sucede si me olvido de una dosis? Es importante no olvidar ninguna dosis. Informe a su mdico o a su  profesional de la salud si no puede asistir a Photographer. Qu puede interactuar con este medicamento? No use este medicamento con ninguno de los siguientes frmacos: vacunas de virus vivos Este medicamento tambin puede interactuar con los siguientes frmacos: medicamentos antivirales para la hepatitis, VIH o SIDA ciertos antibiticos, tales como eritromicina y Ambulance person ciertos medicamentos para infecciones micticas, tales como itraconazol y ketoconazol ciertos medicamentos para convulsiones, tales como [place], fenobarbital y fenitona gemfibrozil nefazodona rifampicina hierba de [place] Puede ser que esta lista no menciona todas las posibles interacciones. Informe a su profesional de [employer]	[place] de [employer] productos a base de hierbas, medicamentos de [place] o suplementos nutritivos que est tomando. Si usted fuma, consume bebidas alcohlicas o si utiliza drogas ilegales, indqueselo tambin a su profesional de [employer]	[place]. Algunas sustancias pueden interactuar con su medicamento. A qu debo estar atento al usar [employer]? Se supervisar su estado de salud atentamente mientras reciba este medicamento. Tendr que hacerse anlisis de sangre importantes mientras est usando este medicamento. Este medicamento puede causar Chief of Staff graves. Para reducir su riesgo, necesitar tomar otro(s) medicamento(s) antes del tratamiento con este medicamento. Si tiene [employer] erupcin cutnea, comezn/picazn o urticaria, hinchazn del rostro, los labios, o la [place], informe de inmediato a su mdico o profesional de Technical sales engineer. En algunos casos, podra recibir [employer] para ayudarlo con los efectos secundarios. Siga todas las instrucciones para usarlos. Este medicamento podra hacerle sentir  un Nurse, mental health. Esto es normal, ya que la quimioterapia puede afectar tanto a las clulas sanas como a las clulas cancerosas. Si presenta algn efecto  secundario, infrmelo. Contine con el tratamiento aun si se siente enfermo, a menos que su mdico le indique que lo suspenda. Llame a su mdico o a su profesional de la salud si tiene fiebre, escalofros o dolor de garganta, o cualquier otro sntoma de resfro o gripe. No se trate usted mismo. Este medicamento reduce la capacidad del cuerpo para combatir infecciones. Trate de no acercarse a personas que estn enfermas. Este medicamento podra aumentar el riesgo de moretones o sangrado. Consulte a su mdico o a su profesional de la salud si observa sangrados inusuales. Proceda con cuidado al cepillar sus dientes, usar hilo dental o Risk manager palillos para los dientes, ya que podra contraer una infeccin o Therapist, art con mayor facilidad. Si se somete a algn tratamiento dental, informe a su dentista que est [employer]. Evite usar productos que contienen aspirina, acetaminofeno, ibuprofeno, naproxeno o ketoprofeno, a menos que as lo indique su mdico. Estos productos pueden ocultar la fiebre. No debe quedar embarazada mientras est [employer]. Las mujeres deben informar a su mdico si estn buscando quedar embarazadas o si creen que podran estar embarazadas. Existe la posibilidad de efectos secundarios graves en un beb sin nacer. Para obtener ms informacin, hable con su profesional de la salud o su farmacutico. No debe Economist a un beb mientras est usando este medicamento. Para los hombres, se desaconseja concebir hijos mientras reciben [employer]. Este producto podra contener alcohol. Pregunte a Midwife o a su proveedor de atencin de la salud si este medicamento contiene alcohol. Asegrese de decirles a todos los proveedores de atencin de la salud que usted est tomando [place]. Ciertos medicamentos, como metronidazol y disulfiram, pueden causar una reaccin desagradable cuando se usan con alcohol. Esta reaccin incluye enrojecimiento, dolor de  cabeza, nuseas, vmitos, sudoracin y aumento de la sed. La reaccin puede durar de 30 minutos a varias horas. Qu efectos secundarios puedo tener al [employer] este medicamento? Efectos secundarios que debe informar a su mdico o a Barrister's clerk de la salud tan pronto como sea posible: Chief of Staff, tales como erupcin cutnea, comezn/picazn o urticaria, e hinchazn de la cara, los labios o la lengua problemas para respirar cambios en la visin frecuencia cardiaca rpida e irregular presin sangunea alta o baja llagas en la boca dolor, hormigueo o entumecimiento de las manos o los pies signos de disminucin en la cantidad de plaquetas o sangrado: moretones, puntos rojos en la piel, heces de color negro y aspecto alquitranado, sangre en la orina signos de disminucin en la cantidad de glbulos rojos: debilidad o cansancio inusuales, sensacin de Secondary school teacher o aturdimiento, cadas signos de infeccin: fiebre o escalofros, tos, dolor de garganta, dolor o dificultad para orinar signos y sntomas de lesin en el hgado, tales como orina amarilla oscura o [place]; sensacin general de estar enfermo o sntomas gripales; heces claras; prdida del apetito; nuseas; dolor en la regin abdominal superior derecha; debilidad o cansancio inusuales; color amarillento de los ojos o la piel hinchazn de tobillos, pies, manos frecuencia cardiaca inusualmente lenta Efectos secundarios que generalmente no requieren atencin mdica (infrmelos a su mdico o a su profesional de la salud si persisten o si son molestos): diarrea cada del cabello prdida del apetito dolores musculares o articulares nuseas, vmito dolor, enrojecimiento o irritacin en el lugar de la inyeccin cansancio Puede ser que  esta lista no menciona todos los posibles efectos secundarios. Comunquese a su mdico por asesoramiento mdico [employer]. Usted puede informar los efectos secundarios a la FDA por telfono  al 1-800-FDA-1088. Dnde debo guardar mi medicina? Este medicamento se administra en hospitales o clnicas, y no necesitar guardarlo en su domicilio. ATENCIN: Este folleto es un resumen. Puede ser que no cubra toda la posible informacin. Si usted tiene preguntas acerca de esta medicina, consulte con su mdico, su farmacutico o su profesional de Technical sales engineer.  2023 Elsevier/Gold Standard (2019-06-16 00:00:00)

## 2022-02-28 MED FILL — Dexamethasone Sodium Phosphate Inj 100 MG/10ML: INTRAMUSCULAR | Qty: 1 | Status: AC

## 2022-03-03 ENCOUNTER — Other Ambulatory Visit: Payer: Self-pay

## 2022-03-03 ENCOUNTER — Encounter: Payer: Self-pay | Admitting: Hematology and Oncology

## 2022-03-03 ENCOUNTER — Inpatient Hospital Stay (HOSPITAL_BASED_OUTPATIENT_CLINIC_OR_DEPARTMENT_OTHER): Payer: Self-pay | Admitting: Hematology and Oncology

## 2022-03-03 ENCOUNTER — Inpatient Hospital Stay: Payer: Self-pay

## 2022-03-03 ENCOUNTER — Ambulatory Visit: Payer: Self-pay

## 2022-03-03 DIAGNOSIS — C50411 Malignant neoplasm of upper-outer quadrant of right female breast: Secondary | ICD-10-CM

## 2022-03-03 LAB — CBC WITH DIFFERENTIAL (CANCER CENTER ONLY)
Abs Immature Granulocytes: 0.03 10*3/uL (ref 0.00–0.07)
Basophils Absolute: 0 10*3/uL (ref 0.0–0.1)
Basophils Relative: 1 %
Eosinophils Absolute: 0.1 10*3/uL (ref 0.0–0.5)
Eosinophils Relative: 3 %
HCT: 32.5 % — ABNORMAL LOW (ref 36.0–46.0)
Hemoglobin: 10.6 g/dL — ABNORMAL LOW (ref 12.0–15.0)
Immature Granulocytes: 1 %
Lymphocytes Relative: 22 %
Lymphs Abs: 0.8 10*3/uL (ref 0.7–4.0)
MCH: 28.3 pg (ref 26.0–34.0)
MCHC: 32.6 g/dL (ref 30.0–36.0)
MCV: 86.9 fL (ref 80.0–100.0)
Monocytes Absolute: 0.2 10*3/uL (ref 0.1–1.0)
Monocytes Relative: 6 %
Neutro Abs: 2.6 10*3/uL (ref 1.7–7.7)
Neutrophils Relative %: 67 %
Platelet Count: 271 10*3/uL (ref 150–400)
RBC: 3.74 MIL/uL — ABNORMAL LOW (ref 3.87–5.11)
RDW: 14.1 % (ref 11.5–15.5)
WBC Count: 3.8 10*3/uL — ABNORMAL LOW (ref 4.0–10.5)
nRBC: 0 % (ref 0.0–0.2)

## 2022-03-03 LAB — CMP (CANCER CENTER ONLY)
ALT: 23 U/L (ref 0–44)
AST: 17 U/L (ref 15–41)
Albumin: 3.9 g/dL (ref 3.5–5.0)
Alkaline Phosphatase: 48 U/L (ref 38–126)
Anion gap: 5 (ref 5–15)
BUN: 11 mg/dL (ref 6–20)
CO2: 26 mmol/L (ref 22–32)
Calcium: 8.9 mg/dL (ref 8.9–10.3)
Chloride: 103 mmol/L (ref 98–111)
Creatinine: 0.58 mg/dL (ref 0.44–1.00)
GFR, Estimated: >60 mL/min (ref >60)
Glucose, Bld: 86 mg/dL (ref 70–99)
Potassium: 3.6 mmol/L (ref 3.5–5.1)
Sodium: 134 mmol/L — ABNORMAL LOW (ref 135–145)
Total Bilirubin: 0.3 mg/dL (ref 0.3–1.2)
Total Protein: 6 g/dL — ABNORMAL LOW (ref 6.5–8.1)

## 2022-03-03 MED ORDER — HEPARIN SOD (PORK) LOCK FLUSH 100 UNIT/ML IV SOLN
500.0000 [IU] | Freq: Once | INTRAVENOUS | Status: AC | PRN
  Administered 2022-03-03: 500 [IU]

## 2022-03-03 MED ORDER — SODIUM CHLORIDE 0.9 % IV SOLN
10.0000 mg | Freq: Once | INTRAVENOUS | Status: AC
  Administered 2022-03-03: 10 mg via INTRAVENOUS
  Filled 2022-03-03: qty 10

## 2022-03-03 MED ORDER — CETIRIZINE HCL 10 MG/ML IV SOLN
10.0000 mg | Freq: Once | INTRAVENOUS | Status: AC
  Administered 2022-03-03: 10 mg via INTRAVENOUS
  Filled 2022-03-03: qty 1

## 2022-03-03 MED ORDER — FAMOTIDINE IN NACL 20-0.9 MG/50ML-% IV SOLN
20.0000 mg | Freq: Once | INTRAVENOUS | Status: AC
  Administered 2022-03-03: 20 mg via INTRAVENOUS
  Filled 2022-03-03: qty 50

## 2022-03-03 MED ORDER — SODIUM CHLORIDE 0.9 % IV SOLN: Freq: Once | INTRAVENOUS | Status: AC

## 2022-03-03 MED ORDER — SODIUM CHLORIDE 0.9 % IV SOLN
80.0000 mg/m2 | Freq: Once | INTRAVENOUS | Status: AC
  Administered 2022-03-03: 138 mg via INTRAVENOUS
  Filled 2022-03-03: qty 23

## 2022-03-03 MED ORDER — SODIUM CHLORIDE 0.9% FLUSH
10.0000 mL | INTRAVENOUS | Status: DC | PRN
  Administered 2022-03-03: 10 mL

## 2022-03-03 NOTE — Patient Instructions (Signed)

## 2022-03-03 NOTE — Patient Instructions (Signed)
Instrucciones al darle de alta: Discharge Instructions Gracias por elegir al [facility] de Cncer de [place] para brindarle atencin mdica de oncologa y Music therapist.   Si usted tiene una cita de laboratorio con [place], por favor vaya directamente [place] y regstrese en el rea de Control and instrumentation engineer.   Use ropa cmoda y [place] para tener fcil acceso a las vas del Portacath (acceso venoso de Engineer, site duracin) o la lnea PICC (catter central colocado por va perifrica).   Nos esforzamos por ofrecerle tiempo de calidad con su proveedor. Es posible que tenga que volver a programar su cita si llega tarde (15 minutos o ms).  El llegar tarde le afecta a usted y a otros pacientes cuyas citas son posteriores a Merchandiser, retail.  Adems, si usted falta a tres o ms citas sin avisar a la oficina, puede ser retirado(a) de la clnica a discrecin del proveedor.      Para las solicitudes de renovacin de recetas, pida a su farmacia que se ponga en contacto con nuestra oficina y deje que transcurran 53 horas para que se complete el proceso de las renovaciones.    Hoy usted recibi los siguientes agentes de quimioterapia e/o inmunoterapia: Paclitaxel (Taxol)   Para ayudar a prevenir las nuseas y los vmitos despus de su tratamiento, le recomendamos que tome su medicamento para las nuseas segn las indicaciones.  LOS SNTOMAS QUE DEBEN COMUNICARSE INMEDIATAMENTE SE INDICAN A CONTINUACIN: *FIEBRE SUPERIOR A 100.4 F (38 C) O MS *ESCALOFROS O SUDORACIN *NUSEAS Y VMITOS QUE NO SE CONTROLAN CON EL MEDICAMENTO PARA LAS NUSEAS *DIFICULTAD INUSUAL PARA RESPIRAR  *MORETONES O HEMORRAGIAS NO HABITUALES *PROBLEMAS URINARIOS (dolor o ardor al Garment/textile technologist o frecuencia para Garment/textile technologist) *PROBLEMAS INTESTINALES (diarrea inusual, estreimiento, dolor cerca del ano) SENSIBILIDAD EN LA BOCA Y EN LA GARGANTA CON O SIN LA PRESENCIA DE LCERAS (dolor de garganta, llagas en la boca o dolor de  muelas/dientes) ERUPCIN, HINCHAZN O DOLORES INUSUALES FLUJO VAGINAL INUSUAL O PICAZN/RASQUIA    Los puntos marcados con un asterisco ( *) indican una posible emergencia y debe hacer un seguimiento tan pronto como le sea posible o vaya al Departamento de Emergencias si se le presenta algn problema.  Por favor, muestre la [place] DE ADVERTENCIA DE Windy Canny DE ADVERTENCIA DE Benay Spice al registrarse en [address] de Emergencias y a la enfermera de triaje.  Si tiene preguntas despus de su visita o necesita cancelar o volver a programar su cita, por favor pngase en contacto con [place]  Dept: [phone] y [place]. Las horas de oficina son de 8:00 a.m. a 4:30 p.m. de lunes a viernes. Por favor, tenga en cuenta que los mensajes de voz que se dejan despus de las 4:00 p.m. posiblemente no se devolvern hasta el siguiente da de [place].  Cerramos los fines de semana y [employer]. En todo momento tiene acceso a una enfermera para preguntas urgentes. Por favor, llame al nmero principal de la clnica Dept: [phone] y [place] instrucciones.   Para cualquier pregunta que no sea de carcter urgente, tambin puede ponerse en contacto con su proveedor [employer]. Ahora ofrecemos visitas electrnicas para cualquier persona mayor de 18 aos que solicite atencin mdica en lnea para los sntomas que no sean urgentes. Para ms detalles vaya a mychart.[email or web address].   Tambin puede bajar la aplicacin de MyChart! Vaya a la tienda de aplicaciones, busque "MyChart", abra la aplicacin, seleccione [employer],  e ingrese con su nombre de usuario y la contrasea de Pharmacist, community.  Las mscaras son opcionales en los centros de Hotel manager. Si desea que su equipo de cuidados mdicos use una [employer] atienden, por favor hgaselo saber al personal. Bethann Berkshire una persona de apoyo que tenga por lo menos 16 aos  para que le acompae a sus citas. Paclitaxel Injection Qu es este medicamento? El PACLITAXEL es un agente quimioteraputico. Este medicamento acta sobre las clulas que se dividen rpidamente, como las clulas cancerosas, y finalmente provoca la muerte de estas clulas. Se utiliza en el tratamiento del cncer de ovario, mama, pulmn, sarcoma de Kaposi y otros tipos de cncer. Este medicamento puede ser utilizado para otros usos; si tiene alguna pregunta consulte con su proveedor de atencin mdica o con su farmacutico. MARCAS COMUNES: Onxol, Taxol Qu le debo informar a mi profesional de la salud antes de tomar este medicamento? Necesitan saber si usted presenta alguno de los siguientes problemas o situaciones: antecedentes de frecuencia cardiaca irregular enfermedad heptica recuentos sanguneos bajos, como baja cantidad de glbulos blancos, plaquetas o glbulos rojos enfermedad pulmonar o respiratoria, como asma hormigueo en las manos o los pies, u otro trastorno del sistema nervioso una reaccin alrgica o inusual al paclitaxel, al alcohol, al aceite de ricino polioxietilado, a otros medicamentos quimioteraputicos, a otros medicamentos, alimentos, colorantes o conservantes si est embarazada o buscando quedar embarazada si est amamantando a un beb Cmo debo [employer]? Este medicamento se administra como infusin en una vena. Un profesional de la salud especialmente capacitado lo administra en un hospital o clnica. Hable con su pediatra para informarse acerca del uso de este medicamento en nios. Puede requerir atencin especial. Sobredosis: Pngase en contacto inmediatamente con un centro toxicolgico o una sala de urgencia si usted cree que haya tomado demasiado medicamento. ATENCIN: [employer] es solo para usted. No comparta este medicamento con nadie. Qu sucede si me olvido de una dosis? Es importante no olvidar ninguna dosis. Informe a su mdico o a su  profesional de la salud si no puede asistir a Photographer. Qu puede interactuar con este medicamento? No use este medicamento con ninguno de los siguientes frmacos: vacunas de virus vivos Este medicamento tambin puede interactuar con los siguientes frmacos: medicamentos antivirales para la hepatitis, VIH o SIDA ciertos antibiticos, tales como eritromicina y Ambulance person ciertos medicamentos para infecciones micticas, tales como itraconazol y ketoconazol ciertos medicamentos para convulsiones, tales como [place], fenobarbital y fenitona gemfibrozil nefazodona rifampicina hierba de [place] Puede ser que esta lista no menciona todas las posibles interacciones. Informe a su profesional de [employer]	[place] de [employer] productos a base de hierbas, medicamentos de [place] o suplementos nutritivos que est tomando. Si usted fuma, consume bebidas alcohlicas o si utiliza drogas ilegales, indqueselo tambin a su profesional de [employer]	[place]. Algunas sustancias pueden interactuar con su medicamento. A qu debo estar atento al usar [employer]? Se supervisar su estado de salud atentamente mientras reciba este medicamento. Tendr que hacerse anlisis de sangre importantes mientras est usando este medicamento. Este medicamento puede causar Chief of Staff graves. Para reducir su riesgo, necesitar tomar otro(s) medicamento(s) antes del tratamiento con este medicamento. Si tiene [employer] erupcin cutnea, comezn/picazn o urticaria, hinchazn del rostro, los labios, o la [place], informe de inmediato a su mdico o profesional de Technical sales engineer. En algunos casos, podra recibir [employer] para ayudarlo con los efectos secundarios. Siga todas las instrucciones para usarlos. Este medicamento podra hacerle sentir  un Nurse, mental health. Esto es normal, ya que la quimioterapia puede afectar tanto a las clulas sanas como a las clulas cancerosas. Si presenta algn efecto  secundario, infrmelo. Contine con el tratamiento aun si se siente enfermo, a menos que su mdico le indique que lo suspenda. Llame a su mdico o a su profesional de la salud si tiene fiebre, escalofros o dolor de garganta, o cualquier otro sntoma de resfro o gripe. No se trate usted mismo. Este medicamento reduce la capacidad del cuerpo para combatir infecciones. Trate de no acercarse a personas que estn enfermas. Este medicamento podra aumentar el riesgo de moretones o sangrado. Consulte a su mdico o a su profesional de la salud si observa sangrados inusuales. Proceda con cuidado al cepillar sus dientes, usar hilo dental o Risk manager palillos para los dientes, ya que podra contraer una infeccin o Therapist, art con mayor facilidad. Si se somete a algn tratamiento dental, informe a su dentista que est [employer]. Evite usar productos que contienen aspirina, acetaminofeno, ibuprofeno, naproxeno o ketoprofeno, a menos que as lo indique su mdico. Estos productos pueden ocultar la fiebre. No debe quedar embarazada mientras est [employer]. Las mujeres deben informar a su mdico si estn buscando quedar embarazadas o si creen que podran estar embarazadas. Existe la posibilidad de efectos secundarios graves en un beb sin nacer. Para obtener ms informacin, hable con su profesional de la salud o su farmacutico. No debe Economist a un beb mientras est usando este medicamento. Para los hombres, se desaconseja concebir hijos mientras reciben Coca[employer]. Este producto podra contener alcohol. Pregunte a Midwife o a su proveedor de atencin de la salud si este medicamento contiene alcohol. Asegrese de decirles a todos los proveedores de atencin de la salud que usted est tomando [place]. Ciertos medicamentos, como metronidazol y disulfiram, pueden causar una reaccin desagradable cuando se usan con alcohol. Esta reaccin incluye enrojecimiento, dolor de  cabeza, nuseas, vmitos, sudoracin y aumento de la sed. La reaccin puede durar de 30 minutos a varias horas. Qu efectos secundarios puedo tener al [employer] este medicamento? Efectos secundarios que debe informar a su mdico o a Barrister's clerk de la salud tan pronto como sea posible: Chief of Staff, tales como erupcin cutnea, comezn/picazn o urticaria, e hinchazn de la cara, los labios o la lengua problemas para respirar cambios en la visin frecuencia cardiaca rpida e irregular presin sangunea alta o baja llagas en la boca dolor, hormigueo o entumecimiento de las manos o los pies signos de disminucin en la cantidad de plaquetas o sangrado: moretones, puntos rojos en la piel, heces de color negro y aspecto alquitranado, sangre en la orina signos de disminucin en la cantidad de glbulos rojos: debilidad o cansancio inusuales, sensacin de Secondary school teacher o aturdimiento, cadas signos de infeccin: fiebre o escalofros, tos, dolor de garganta, dolor o dificultad para orinar signos y sntomas de lesin en el hgado, tales como orina amarilla oscura o [place]; sensacin general de estar enfermo o sntomas gripales; heces claras; prdida del apetito; nuseas; dolor en la regin abdominal superior derecha; debilidad o cansancio inusuales; color amarillento de los ojos o la piel hinchazn de tobillos, pies, manos frecuencia cardiaca inusualmente lenta Efectos secundarios que generalmente no requieren atencin mdica (infrmelos a su mdico o a su profesional de la salud si persisten o si son molestos): diarrea cada del cabello prdida del apetito dolores musculares o articulares nuseas, vmito dolor, enrojecimiento o irritacin en el lugar de la inyeccin cansancio Puede ser que  esta lista no menciona todos los posibles efectos secundarios. Comunquese a su mdico por asesoramiento mdico [employer]. Usted puede informar los efectos secundarios a la FDA por telfono  al 1-800-FDA-1088. Dnde debo guardar mi medicina? Este medicamento se administra en hospitales o clnicas, y no necesitar guardarlo en su domicilio. ATENCIN: Este folleto es un resumen. Puede ser que no cubra toda la posible informacin. Si usted tiene preguntas acerca de esta medicina, consulte con su mdico, su farmacutico o su profesional de Technical sales engineer.  2023 Elsevier/[employer] Standard (2019-06-16 00:00:00)

## 2022-03-03 NOTE — Progress Notes (Signed)
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 Indeterminate mass in the right breast at 12:30, which may represent a fibroadenoma. No evidence of right axillary adenopathy.   05/16/2021 Pathology Results   Right breast 10 0 clock mass biopsy showed invasive mammary carcinoma, ductal phenotype prognostics include ER 95% strong staining intensity PR 95% strong staining intensity Ki-67 of 40% and HER2 negative   06/11/2021 Cancer Staging   Staging form: Breast, AJCC 8th Edition - Pathologic: Stage IIA (pT2, pN1a, cM0, G3, ER+, PR+, HER2-, Oncotype DX score: 24) - Signed by Hannah Pike, MD on 11/04/2021 Multigene prognostic tests performed: Oncotype DX Recurrence score range: Greater than or equal to 11 Histologic grading system: 3 grade system   07/10/2021 Genetic Testing   Negative hereditary cancer genetic testing: no pathogenic variants detected [facility] Breast STAT Panel or Common Hereditary Cancers +RNA Panel.  Report dates are Jul 10, 2021 and Jul 18, 2021.   The STAT Breast cancer panel offered by [facility] includes sequencing and rearrangement analysis for the following 9 genes:  ATM, BRCA1, BRCA2, CDH1, CHEK2, PALB2, PTEN, STK11 and TP53.   The Common  Hereditary Cancers + RNA Panel offered by [facility] includes sequencing, deletion/duplication, and RNA testing of the following 47 genes: APC, ATM, AXIN2, BARD1, BMPR1A, BRCA1, BRCA2, BRIP1, CDH1, CDK4*, CDKN2A (p14ARF)*, CDKN2A (p16INK4a)*, CHEK2, CTNNA1, DICER1, EPCAM (Deletion/duplication testing only), GREM1 (promoter region deletion/duplication testing only), KIT, MEN1, MLH1, MSH2, MSH3, MSH6, MUTYH, NBN, NF1, NHTL1, PALB2, PDGFRA*, PMS2, POLD1, POLE, PTEN, RAD50, RAD51C, RAD51D, SDHB, SDHC, SDHD, SMAD4, SMARCA4. STK11, TP53, TSC1, TSC2, and VHL.  The following genes were evaluated for sequence changes only: SDHA and HOXB13 c.251G>A variant only.  RNA analysis is not performed for the * genes.     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  Right axillary lymph node evaluation showed 1 out of 8 lymph nodes positive for malignancy., final pathologic staging T2N1A.    11/11/2021 -  Chemotherapy   Patient is on Treatment Plan : BREAST ADJUVANT DOSE DENSE AC q14d / PACLitaxel q7d       CURRENT THERAPY: Cycle 5 of weekly Taxol  INTERVAL HISTORY:  Hannah Murray 47 y.o. female returns for f/u prior to receiving her weekly taxol.  Hannah Murray is accompanied by a [place] interpretor.  Hannah Murray reports the continues to tolerate chemotherapy very well  Hannah Murray reports some cough at the end of infusion, but this didn't happen during the last chemotherapy. Hannah Murray reported some pain in the palms which have resolved. Hannah Murray denies fevers, chills, sweats, headaches, dizziness, edema or residual neuropathy. Hannah Murray has no  other complaints. Rest of the ROS reviewed and negative.   Patient Active Problem List   Diagnosis Date Noted   Cellulitis 12/19/2021   Genetic testing 07/12/2021   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,  right ([place]) 06/11/2021    has No Known Allergies.  MEDICAL HISTORY: Past Medical History:  Diagnosis Date   Medical history non-contributory     SURGICAL HISTORY: Past Surgical History:  Procedure Laterality Date   BREAST RECONSTRUCTION WITH PLACEMENT OF TISSUE EXPANDER AND FLEX HD (ACELLULAR HYDRATED DERMIS) Right 09/30/2021   Procedure: BREAST RECONSTRUCTION WITH PLACEMENT OF TISSUE EXPANDER AND FLEX HD (ACELLULAR HYDRATED DERMIS);  Surgeon: Wallace Going, DO;  Location: [facility];  Service: Plastics;  Laterality: Right;   MASTECTOMY W/ SENTINEL NODE BIOPSY Right 09/30/2021   Procedure: RIGHT MASTECTOMY WITH SENTINEL LYMPH NODE BIOPSY;  Surgeon: Jovita Kussmaul, MD;  Location: [facility];  Service: General;  Laterality: Right;   PORTACATH PLACEMENT Left 11/08/2021   Procedure: INSERTION PORT-A-CATH;  Surgeon: Jovita Kussmaul, MD;  Location: [facility];  Service: General;  Laterality: Left;    SOCIAL HISTORY: Social History   Socioeconomic History   Marital status: Married    Spouse name: Not on file   Number of children: 1   Years of education: Not on file   Highest education level: High school graduate  Occupational History   Not on file  Tobacco Use   Smoking status: Never   Smokeless tobacco: Never  Vaping Use   Vaping Use: Never used  Substance and Sexual Activity   Alcohol use: Yes    Comment: occasionally   Drug use: Never   Sexual activity: Not Currently  Other Topics Concern   Not on file  Social History Narrative   Not on file   Social Determinants of Health   Financial Resource Strain: Not on file  Food Insecurity: No Food Insecurity (04/30/2021)   Hunger Vital Sign    Worried About Running Out of Food in the Last Year: Never true    Ran Out of Food in the Last Year: Never true  Transportation Needs: No Transportation Needs (04/30/2021)   PRAPARE - Hydrologist (Medical): No     Lack of Transportation (Non-Medical): No  Physical Activity: Not on file  Stress: Not on file  Social Connections: Not on file  Intimate Partner Violence: Not At Risk (07/02/2021)   Humiliation, Afraid, Rape, and Kick questionnaire    Fear of Current or Ex-Partner: No    Emotionally Abused: No    Physically Abused: No    Sexually Abused: No    FAMILY HISTORY: Family History  Problem Relation Age of Onset   Hypertension Father    Diabetes Father    Thyroid disease Father    Ovarian cancer Other        MGM's niece; d. > 50    PHYSICAL EXAMINATION  ECOG PERFORMANCE STATUS: 1 - Symptomatic but completely ambulatory  Vitals:   03/03/22 1301  BP: 135/81  Pulse: 79  Resp: 17  Temp: 97.7 F (36.5 C)  SpO2: 100%    Physical Exam Constitutional:      General: Hannah Murray is not in acute distress.    Appearance: Normal appearance. Hannah Murray is not toxic-appearing.  HENT:     Head: Normocephalic and atraumatic.  Eyes:     General: No scleral icterus. Cardiovascular:     Rate and Rhythm: Normal  rate and regular rhythm.     Pulses: Normal pulses.     Heart sounds: Normal heart sounds.  Pulmonary:     Effort: Pulmonary effort is normal.     Breath sounds: Normal breath sounds.  Abdominal:     General: Bowel sounds are normal. There is no distension.     Palpations: Abdomen is soft.     Tenderness: There is no abdominal tenderness.  Musculoskeletal:        General: No swelling.     Cervical back: Neck supple.  Lymphadenopathy:     Cervical: No cervical adenopathy.  Skin:    General: Skin is warm and dry.     Findings: No rash.  Neurological:     General: No focal deficit present.     Mental Status: Hannah Murray is alert.  Psychiatric:        Mood and Affect: Mood normal.        Behavior: Behavior normal.     LABORATORY DATA:  CBC    Component Value Date/Time   WBC 3.8 (L) 03/03/2022 1214   WBC 9.3 12/20/2021 0900   RBC 3.74 (L) 03/03/2022 1214   HGB 10.6 (L) 03/03/2022 1214    HCT 32.5 (L) 03/03/2022 1214   PLT 271 03/03/2022 1214   MCV 86.9 03/03/2022 1214   MCH 28.3 03/03/2022 1214   MCHC 32.6 03/03/2022 1214   RDW 14.1 03/03/2022 1214   LYMPHSABS 0.8 03/03/2022 1214   MONOABS 0.2 03/03/2022 1214   EOSABS 0.1 03/03/2022 1214   BASOSABS 0.0 03/03/2022 1214    CMP     Component Value Date/Time   NA 134 (L) 03/03/2022 1214   K 3.6 03/03/2022 1214   CL 103 03/03/2022 1214   CO2 26 03/03/2022 1214   GLUCOSE 86 03/03/2022 1214   BUN 11 03/03/2022 1214   CREATININE 0.58 03/03/2022 1214   CALCIUM 8.9 03/03/2022 1214   PROT 6.0 (L) 03/03/2022 1214   ALBUMIN 3.9 03/03/2022 1214   AST 17 03/03/2022 1214   ALT 23 03/03/2022 1214   ALKPHOS 48 03/03/2022 1214   BILITOT 0.3 03/03/2022 1214   GFRNONAA >60 03/03/2022 1214    ASSESSMENT and PLAN:  Hannah Murray is a 47 y.o. female who returns for a follow up for right breast cancer.   #Right breast intraductal carcinoma: --ER/PR positive, HER2 negative, clinically stage T2 N0 M0 grade 2/3  --Underwent right mastectomy on 09/30/2021 which showed grade 3 invasive ductal carcinoma measuring 4.3 cm in greatest dimension, margins negative along with solid type DCIS high-grade. Right axillary lymph node evaluation showed 1 out of 8 lymph nodes positive for malignancy.  --Oncotype test resulted at 24, benefit of chemotherapy cannot be excluded.  --Recommendation was adjuvant dose dense adriamycin and cytoxan q 14 days x 4 cycles followed by weekly taxol q 7 days x 12 cycles. --Patient completed adjuvant AC chemotherapy from 11/06/2021-12/23/2021.  --Hannah Murray started adjuvant weekly taxol on 01/06/2022.  -- Okay to proceed with chemotherapy as scheduled.  No definitive neuropathy reported.--Continue with weekly labs and taxol chemotherapy.  -- The cough that Hannah Murray reports at the end of infusion has also not happen with her last infusion.  Will continue to monitor this. RTC in 2 weeks for toxicity check     No  problem-specific Assessment & Plan notes found for this encounter.    All questions were answered. The patient knows to call the clinic with any problems, questions or concerns. We can certainly see the patient  much sooner if necessary.  I have spent a total of 30 minutes minutes of face-to-face and non-face-to-face time, preparing to see the patient, performing a medically appropriate examination, counseling and educating the patient, documenting clinical information in the electronic health record,  and care coordination.

## 2022-03-05 NOTE — Therapy (Signed)
OUTPATIENT PHYSICAL THERAPY BREAST CANCER POST OP FOLLOW UP   Patient Name: Hannah Murray MRN: [id number] DOB:1975-09-05, 47 y.o., female Today's Date: 03/06/2022  END OF SESSION:  PT End of Session - 03/06/22 1057     Visit Number 2    Number of Visits 2    Date for PT Re-Evaluation 03/06/22    PT Start Time 1100    PT Stop Time 1152    PT Time Calculation (min) 52 min    Activity Tolerance Patient tolerated treatment well    Behavior During Therapy WFL for tasks assessed/performed             Past Medical History:  Diagnosis Date   Medical history non-contributory    Past Surgical History:  Procedure Laterality Date   BREAST RECONSTRUCTION WITH PLACEMENT OF TISSUE EXPANDER AND FLEX HD (ACELLULAR HYDRATED DERMIS) Right 09/30/2021   Procedure: BREAST RECONSTRUCTION WITH PLACEMENT OF TISSUE EXPANDER AND FLEX HD (ACELLULAR HYDRATED DERMIS);  Surgeon: Wallace Going, DO;  Location: [facility];  Service: Plastics;  Laterality: Right;   MASTECTOMY W/ SENTINEL NODE BIOPSY Right 09/30/2021   Procedure: RIGHT MASTECTOMY WITH SENTINEL LYMPH NODE BIOPSY;  Surgeon: Jovita Kussmaul, MD;  Location: [name];  Service: General;  Laterality: Right;   PORTACATH PLACEMENT Left 11/08/2021   Procedure: INSERTION PORT-A-CATH;  Surgeon: Jovita Kussmaul, MD;  Location: [facility];  Service: General;  Laterality: Left;   Patient Active Problem List   Diagnosis Date Noted   Cellulitis 12/19/2021   Genetic testing 07/12/2021   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, right ([facility]) 06/11/2021    PCP: No  REFERRING PROVIDER: Jovita Kussmaul, MD  REFERRING DIAG: Right Breast Cancer  THERAPY DIAG:  Malignant neoplasm of upper-outer quadrant of right female breast, unspecified estrogen receptor status ([facility])  Abnormal posture  Rationale for Evaluation and Treatment: Rehabilitation  ONSET DATE: 04/25/2021  SUBJECTIVE:                                                                                                                                                                                            SUBJECTIVE STATEMENT: (Pt present with interpreter)I think I am doing very well with my shoulder ROM. No complaints of swelling.Still have some numbness at posterior arm and lateral trunk.  PERTINENT HISTORY:  Patient was diagnosed on 05/21/2021 with 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 It measures 4.3 cm and is located in the upper-outer quadrant. She had surgery on 09/30/2021 for Right Mastectomy with tissue expander and  with  SLNB with 1/8 positive LN's. Pt started chemotherapy on 11/11/2021. She will also have radiation and chemotherapy.  PATIENT GOALS:  Reassess how my recovery is going related to arm function, pain, and swelling.Pt   PAIN:  Are you having pain? 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    OBJECTIVE:   PATIENT SURVEYS:  QUICK DASH: 11%  OBSERVATION//S: Incisions nicely healed with no visible swelling  POSTUR/E:  Round shouldered, forward head  LYMPHEDEMA ASSESSMENT:   UPPER EXTREMITY AROM/PROM:   A/PROM RIGHT   06/12/2021 RIGHT 03/06/2022  Shoulder extension 53 54  Shoulder flexion 167 163  Shoulder abduction 180 175  Shoulder internal rotation 70 62  Shoulder external rotation 102 100                          (Blank rows = not tested)   A/PROM LEFT   06/12/2021  Shoulder extension 65  Shoulder flexion 165  Shoulder abduction 180  Shoulder internal rotation 65  Shoulder external rotation 104                          (Blank rows = not tested)     CERVICAL AROM: All within functional limits; Decreased 40% for bilateral rotation, 25% for bilateral SB        UPPER EXTREMITY STRENGTH: WNL   Quick Dash : 11%   LYMPHEDEMA ASSESSMENTS:    LANDMARK  RIGHT  06/12/2021 RIGHT 03/06/2022  10 cm proximal to olecranon process 29.1 29.0  Olecranon process 24.1 24.2  10 cm proximal to ulnar styloid process 21.1 21.1  Just proximal to ulnar styloid process 14.8 14.8  Across hand at thumb web space 20.1 19.9  At base of 2nd digit 6.1 6.0  (Blank rows = not tested)   LANDMARK LEFT  06/12/2021  10 cm proximal to olecranon process 29.3  Olecranon process 23.9  10 cm proximal to ulnar styloid process 20.0  Just proximal to ulnar styloid process 14.8  Across hand at thumb web space 19.3  At base of 2nd digit 5.8  (Blank rows = not tested)       Surgery type/Date: Right Mastectomy with tissue expander 09/30/2021 Number of lymph nodes removed: 1/8 Current/past treatment (chemo, radiation, hormone therapy): chemo started 11/12/2022, pending radiation, anti estrogens Other symptoms:  Heaviness/tightness Yesmild tightness Pain No Pitting edema No Infections Yes Decreased scar mobility No Stemmer sign No  PATIENT EDUCATION:  Education details: SOZO screen, reminded about posture, discussed compression sleeve for prevention and gave Alight form and script to go to [facility]. Reviewed precautions for Lymphedema. Advised to contact us with any questions or concerns, performed 4 post op exercises x 4 ea Person educated: Patient Education method: Explanation and Demonstration Education comprehension: verbalized understanding and returned demonstration  HOME EXERCISE PROGRAM: Reviewed previously given post op HEP.x 4 reps ea   ASSESSMENT:  CLINICAL IMPRESSION: Pt had right mastectomy with SLNB and 1/8 LN's positive on 09/30/2021. She has been undergoing chemotherapy with good tolerance  and has 3 left  She is pending radiation after chemo and will have expander exchange after radiation. She is doing exceptionally well with ROM nearly WNL and no sign of lymphedema. We discussed precautions for lymphedema and return to activities. She was given a  script and Alight form to get a compression  sleeve/gauntlet at [facility]. She was advised to contact us with any questions or concerns. Her 3 month SOZO was scheduled today.  Pt will benefit from skilled therapeutic intervention to improve on the following deficits: Decreased knowledge of precautions, impaired UE functional use, pain, decreased ROM, postural dysfunction.   PT treatment/interventions: ADL/Self care home management, Therapeutic exercises, Patient/Family education, and Self Care   GOALS: Goals reviewed with patient? Yes  LONG TERM GOALS:  (STG=LTG)  GOALS Name Target Date  Goal status  1 Pt will demonstrate she has regained full shoulder ROM and function post operatively compared to baselines.  Baseline: 03/06/2022 MET  '2     3     4        '$ PLAN:  PT FREQUENCY/DURATION: no further PT needs identified  PLAN FOR NEXT SESSION: Pt is discharged due to no further needs identified at this time. She was advised to contact us with any questions or concerns. She is to get a prophylactic compression sleeve/gauntlet at [facility] through [facility].   [facility]  [address], [address]  [place] [address]  [phone]  After Breast Cancer Class It is recommended you attend the ABC class to be educated on lymphedema risk reduction. This class is free of charge and lasts for 1 hour. It is a 1-time class. You will need to download the Web[employer] app either on your phone or computer. We will send you a link the night before or the morning of the class. You should be able to click on that link to join the class. This is not a confidential class. You don't have to turn your camera on, but other participants may be able to see your email address.  Scar massage You can begin gentle scar massage to you incision sites. Gently place one hand on the incision and move the skin (without sliding on the skin) in various directions. Do this for a few minutes and  then you can gently massage either coconut oil or vitamin E cream into the scars.  Compression garment You should continue wearing your compression bra until you feel like you no longer have swelling.  Home exercise Program Continue doing the exercises you were given until you feel like you can do them without feeling any tightness at the end.   Walking Program Studies show that 30 minutes of walking per day (fast enough to elevate your heart rate) can significantly reduce the risk of a cancer recurrence. If you can't walk due to other medical reasons, we encourage you to find another activity you could do (like a stationary bike or water exercise).  Posture After breast cancer surgery, people frequently sit with rounded shoulders posture because it puts their incisions on slack and feels better. If you sit like this and scar tissue forms in that position, you can become very tight and have pain sitting or standing with good posture. Try to be aware of your posture and sit and stand up tall to heal properly.  Follow up PT: It is recommended you return every 3 months for the first 3 years following surgery to be assessed on the SOZO machine for an L-Dex score. This helps prevent clinically significant lymphedema in 95% of patients. These follow up screens are 10 minute appointments that you are not billed for.  Claris Pong, PT 03/06/2022, 11:54 AM

## 2022-03-06 ENCOUNTER — Ambulatory Visit: Payer: Self-pay | Attending: Hematology and Oncology

## 2022-03-06 DIAGNOSIS — C50411 Malignant neoplasm of upper-outer quadrant of right female breast: Secondary | ICD-10-CM | POA: Insufficient documentation

## 2022-03-06 DIAGNOSIS — R293 Abnormal posture: Secondary | ICD-10-CM | POA: Insufficient documentation

## 2022-03-07 ENCOUNTER — Other Ambulatory Visit: Payer: Self-pay

## 2022-03-10 MED FILL — Dexamethasone Sodium Phosphate Inj 100 MG/10ML: INTRAMUSCULAR | Qty: 1 | Status: AC

## 2022-03-11 ENCOUNTER — Other Ambulatory Visit: Payer: Self-pay

## 2022-03-11 ENCOUNTER — Ambulatory Visit: Payer: Self-pay | Admitting: Physician Assistant

## 2022-03-11 ENCOUNTER — Ambulatory Visit: Payer: Self-pay

## 2022-03-11 ENCOUNTER — Ambulatory Visit: Payer: Self-pay | Admitting: Hematology and Oncology

## 2022-03-11 ENCOUNTER — Inpatient Hospital Stay (HOSPITAL_BASED_OUTPATIENT_CLINIC_OR_DEPARTMENT_OTHER): Payer: Self-pay | Admitting: Physician Assistant

## 2022-03-11 ENCOUNTER — Inpatient Hospital Stay: Payer: Self-pay

## 2022-03-11 VITALS — BP 137/79 | HR 94 | Resp 18

## 2022-03-11 DIAGNOSIS — Z95828 Presence of other vascular implants and grafts: Secondary | ICD-10-CM | POA: Insufficient documentation

## 2022-03-11 DIAGNOSIS — C50411 Malignant neoplasm of upper-outer quadrant of right female breast: Secondary | ICD-10-CM

## 2022-03-11 LAB — CBC WITH DIFFERENTIAL (CANCER CENTER ONLY)
Abs Immature Granulocytes: 0.02 10*3/uL (ref 0.00–0.07)
Basophils Absolute: 0 10*3/uL (ref 0.0–0.1)
Basophils Relative: 0 %
Eosinophils Absolute: 0.1 10*3/uL (ref 0.0–0.5)
Eosinophils Relative: 2 %
HCT: 34.5 % — ABNORMAL LOW (ref 36.0–46.0)
Hemoglobin: 11.2 g/dL — ABNORMAL LOW (ref 12.0–15.0)
Immature Granulocytes: 0 %
Lymphocytes Relative: 14 %
Lymphs Abs: 0.8 10*3/uL (ref 0.7–4.0)
MCH: 28.5 pg (ref 26.0–34.0)
MCHC: 32.5 g/dL (ref 30.0–36.0)
MCV: 87.8 fL (ref 80.0–100.0)
Monocytes Absolute: 0.4 10*3/uL (ref 0.1–1.0)
Monocytes Relative: 8 %
Neutro Abs: 4 10*3/uL (ref 1.7–7.7)
Neutrophils Relative %: 76 %
Platelet Count: 292 10*3/uL (ref 150–400)
RBC: 3.93 MIL/uL (ref 3.87–5.11)
RDW: 14 % (ref 11.5–15.5)
WBC Count: 5.4 10*3/uL (ref 4.0–10.5)
nRBC: 0 % (ref 0.0–0.2)

## 2022-03-11 LAB — CMP (CANCER CENTER ONLY)
ALT: 15 U/L (ref 0–44)
AST: 16 U/L (ref 15–41)
Albumin: 3.7 g/dL (ref 3.5–5.0)
Alkaline Phosphatase: 52 U/L (ref 38–126)
Anion gap: 6 (ref 5–15)
BUN: 13 mg/dL (ref 6–20)
CO2: 28 mmol/L (ref 22–32)
Calcium: 9 mg/dL (ref 8.9–10.3)
Chloride: 105 mmol/L (ref 98–111)
Creatinine: 0.57 mg/dL (ref 0.44–1.00)
GFR, Estimated: >60 mL/min (ref >60)
Glucose, Bld: 98 mg/dL (ref 70–99)
Potassium: 3.7 mmol/L (ref 3.5–5.1)
Sodium: 139 mmol/L (ref 135–145)
Total Bilirubin: 0.3 mg/dL (ref 0.3–1.2)
Total Protein: 6.5 g/dL (ref 6.5–8.1)

## 2022-03-11 MED ORDER — SODIUM CHLORIDE 0.9% FLUSH
10.0000 mL | Freq: Once | INTRAVENOUS | Status: AC
  Administered 2022-03-11: 10 mL

## 2022-03-11 MED ORDER — SODIUM CHLORIDE 0.9% FLUSH
10.0000 mL | INTRAVENOUS | Status: DC | PRN
  Administered 2022-03-11: 10 mL

## 2022-03-11 MED ORDER — HEPARIN SOD (PORK) LOCK FLUSH 100 UNIT/ML IV SOLN
500.0000 [IU] | Freq: Once | INTRAVENOUS | Status: AC | PRN
  Administered 2022-03-11: 500 [IU]

## 2022-03-11 MED ORDER — SODIUM CHLORIDE 0.9 % IV SOLN
10.0000 mg | Freq: Once | INTRAVENOUS | Status: AC
  Administered 2022-03-11: 10 mg via INTRAVENOUS
  Filled 2022-03-11: qty 10

## 2022-03-11 MED ORDER — SODIUM CHLORIDE 0.9 % IV SOLN
80.0000 mg/m2 | Freq: Once | INTRAVENOUS | Status: AC
  Administered 2022-03-11: 138 mg via INTRAVENOUS
  Filled 2022-03-11: qty 23

## 2022-03-11 MED ORDER — SODIUM CHLORIDE 0.9 % IV SOLN: Freq: Once | INTRAVENOUS | Status: AC

## 2022-03-11 MED ORDER — CETIRIZINE HCL 10 MG/ML IV SOLN
10.0000 mg | Freq: Once | INTRAVENOUS | Status: AC
  Administered 2022-03-11: 10 mg via INTRAVENOUS
  Filled 2022-03-11: qty 1

## 2022-03-11 MED ORDER — FAMOTIDINE IN NACL 20-0.9 MG/50ML-% IV SOLN
20.0000 mg | Freq: Once | INTRAVENOUS | Status: AC
  Administered 2022-03-11: 20 mg via INTRAVENOUS
  Filled 2022-03-11: qty 50

## 2022-03-11 NOTE — Progress Notes (Signed)
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 Indeterminate mass in the right breast at 12:30, which may represent a fibroadenoma. No evidence of right axillary adenopathy.   05/16/2021 Pathology Results   Right breast 10 0 clock mass biopsy showed invasive mammary carcinoma, ductal phenotype prognostics include ER 95% strong staining intensity PR 95% strong staining intensity Ki-67 of 40% and HER2 negative   06/11/2021 Cancer Staging   Staging form: Breast, AJCC 8th Edition - Pathologic: Stage IIA (pT2, pN1a, cM0, G3, ER+, PR+, HER2-, Oncotype DX score: 24) - Signed by Hannah Pike, MD on 11/04/2021 Multigene prognostic tests performed: Oncotype DX Recurrence score range: Greater than or equal to 11 Histologic grading system: 3 grade system   07/10/2021 Genetic Testing   Negative hereditary cancer genetic testing: no pathogenic variants detected [facility] Breast STAT Panel or Common Hereditary Cancers +RNA Panel.  Report dates are Jul 10, 2021 and Jul 18, 2021.   The STAT Breast cancer panel offered by [facility] includes sequencing and rearrangement analysis for the following 9 genes:  ATM, BRCA1, BRCA2, CDH1, CHEK2, PALB2, PTEN, STK11 and TP53.   The Common  Hereditary Cancers + RNA Panel offered by [facility] includes sequencing, deletion/duplication, and RNA testing of the following 47 genes: APC, ATM, AXIN2, BARD1, BMPR1A, BRCA1, BRCA2, BRIP1, CDH1, CDK4*, CDKN2A (p14ARF)*, CDKN2A (p16INK4a)*, CHEK2, CTNNA1, DICER1, EPCAM (Deletion/duplication testing only), GREM1 (promoter region deletion/duplication testing only), KIT, MEN1, MLH1, MSH2, MSH3, MSH6, MUTYH, NBN, NF1, NHTL1, PALB2, PDGFRA*, PMS2, POLD1, POLE, PTEN, RAD50, RAD51C, RAD51D, SDHB, SDHC, SDHD, SMAD4, SMARCA4. STK11, TP53, TSC1, TSC2, and VHL.  The following genes were evaluated for sequence changes only: SDHA and HOXB13 c.251G>A variant only.  RNA analysis is not performed for the * genes.     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  Right axillary lymph node evaluation showed 1 out of 8 lymph nodes positive for malignancy., final pathologic staging T2N1A.    11/11/2021 -  Chemotherapy   Patient is on Treatment Plan : BREAST ADJUVANT DOSE DENSE AC q14d / PACLitaxel q7d       CURRENT THERAPY: Taxol q 7 days  INTERVAL HISTORY:  Hannah Murray 47 y.o. female returns for f/u prior to receiving her weekly taxol. She is unaccompanied for this visit but has video translator present during visit. She is doing well and denies any changes since her last visit with Dr. Chryl Heck on 03/03/2022.   Her energy levels are stable and she is able to complete all her ADLs on her own. She has a good appetite and weight is stable. She denies nausea, vomiting  or abdominal pain. She still has mild pain in her right hand with numbness in her fingertips. She denies pain or numbness involving her left hand. She denies any interference with grip or dexterity. She reports her bowel habits are unchanged without recurrent episodes of diarrhea or  constipation. She denies easy bruising or signs of active bleeding. She denies fevers, chills, sweats, shortness of breath, chest pain, edema, headaches, dizziness or rash. She has no other complaints. Rest of the ROS reviewed and negative.   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  MEDICAL HISTORY: Past Medical History:  Diagnosis Date   Medical history non-contributory     SURGICAL HISTORY: Past Surgical History:  Procedure Laterality Date   BREAST RECONSTRUCTION WITH PLACEMENT OF TISSUE EXPANDER AND FLEX HD (ACELLULAR HYDRATED DERMIS) Right 09/30/2021   Procedure: BREAST RECONSTRUCTION WITH PLACEMENT OF TISSUE EXPANDER AND FLEX HD (ACELLULAR HYDRATED DERMIS);  Surgeon: Wallace Going, DO;  Location: [facility];  Service: Plastics;  Laterality: Right;   MASTECTOMY W/ SENTINEL NODE BIOPSY Right 09/30/2021   Procedure: RIGHT MASTECTOMY WITH SENTINEL LYMPH NODE BIOPSY;  Surgeon: Jovita Kussmaul, MD;  Location: [facility];  Service: General;  Laterality: Right;   PORTACATH PLACEMENT Left 11/08/2021   Procedure: INSERTION PORT-A-CATH;  Surgeon: Jovita Kussmaul, MD;  Location: [facility];  Service: General;  Laterality: Left;    SOCIAL HISTORY: Social History   Socioeconomic History   Marital status: Married    Spouse name: Not on file   Number of children: 1   Years of education: Not on file   Highest education level: High school graduate  Occupational History   Not on file  Tobacco Use   Smoking status: Never   Smokeless tobacco: Never  Vaping Use   Vaping Use: Never used  Substance and Sexual Activity   Alcohol use: Yes    Comment: occasionally   Drug use: Never   Sexual activity: Not Currently  Other Topics  Concern   Not on file  Social History Narrative   Not on file   Social Determinants of Health   Financial Resource Strain: Not on file  Food Insecurity: No Food Insecurity (04/30/2021)   Hunger Vital Sign    Worried About Running Out of Food in the Last Year: Never true    Ran Out of Food in the Last Year: Never true  Transportation Needs: No Transportation Needs (04/30/2021)   PRAPARE - Hydrologist (Medical): No    Lack of Transportation (Non-Medical): No  Physical Activity: Not on file  Stress: Not on file  Social Connections: Not on file  Intimate Partner Violence: Not At Risk (07/02/2021)   Humiliation, Afraid, Rape, and Kick questionnaire    Fear of Current or Ex-Partner: No    Emotionally Abused: No    Physically Abused: No    Sexually Abused: No    FAMILY HISTORY: Family History  Problem Relation Age of Onset   Hypertension Father    Diabetes Father    Thyroid disease Father    Ovarian cancer Other        MGM's niece; d. > 40   REVIEW OF SYSTEMS:   Constitutional: Negative for appetite change, fatigue, chills, fever  and unexpected weight change HENT: Negative for mouth sores, nosebleeds, sore throat and trouble swallowing.   Eyes: Negative for eye problems and icterus.  Respiratory: Negative for hemoptysis, shortness of breath and wheezing. +Mild cough mainly with infusion.  Cardiovascular: Negative for chest pain and leg swelling.  Gastrointestinal: Negative for abdominal pain, constipation, diarrhea, nausea and vomiting.  Genitourinary: Negative for bladder incontinence, difficulty urinating, dysuria, frequency and hematuria.   Musculoskeletal: Negative for back pain, gait problem, neck pain and neck stiffness.  Skin:Negative for rash and ulcers Neurological: Negative for dizziness, extremity weakness, gait problem, headaches, light-headedness and seizures.  Hematological: Negative for adenopathy. Does not bruise/bleed easily.   Psychiatric/Behavioral: Negative for confusion, depression and sleep disturbance. The patient is not nervous/anxious.    PHYSICAL EXAMINATION  ECOG PERFORMANCE STATUS: 1 - Symptomatic but completely ambulatory  Vitals:   03/11/22 1329  BP: 126/61  Pulse: 84  Resp: 18  Temp: 97.7 F (36.5 C)  SpO2: 100%    Constitutional: Oriented to person, place, and time and well-developed, well-nourished, and in no distress.  HENT:  Head: Normocephalic and atraumatic.  Eyes: Conjunctivae are normal. Right eye exhibits no discharge. Left eye exhibits no discharge. No scleral icterus.  Neck: Normal range of motion. Neck supple.   Cardiovascular: Normal rate, regular rhythm, normal heart sounds Pulmonary/Chest: Effort normal and breath sounds normal. No respiratory distress. No wheezes. No rales.  Musculoskeletal: Normal range of motion. Exhibits no edema.  Lymphadenopathy: No cervical adenopathy.  Neurological: Alert and oriented to person, place, and time. Exhibits normal muscle tone. Gait normal. Coordination normal.  Skin: Skin is warm and dry. No rash noted. Not diaphoretic. No erythema. No pallor.  Psychiatric: Mood, memory and judgment normal.    LABORATORY DATA:  CBC    Component Value Date/Time   WBC 5.4 03/11/2022 1302   WBC 9.3 12/20/2021 0900   RBC 3.93 03/11/2022 1302   HGB 11.2 (L) 03/11/2022 1302   HCT 34.5 (L) 03/11/2022 1302   PLT 292 03/11/2022 1302   MCV 87.8 03/11/2022 1302   MCH 28.5 03/11/2022 1302   MCHC 32.5 03/11/2022 1302   RDW 14.0 03/11/2022 1302   LYMPHSABS 0.8 03/11/2022 1302   MONOABS 0.4 03/11/2022 1302   EOSABS 0.1 03/11/2022 1302   BASOSABS 0.0 03/11/2022 1302    CMP     Component Value Date/Time   NA 134 (L) 03/03/2022 1214   K 3.6 03/03/2022 1214   CL 103 03/03/2022 1214   CO2 26 03/03/2022 1214   GLUCOSE 86 03/03/2022 1214   BUN 11 03/03/2022 1214   CREATININE 0.58 03/03/2022 1214   CALCIUM 8.9 03/03/2022 1214   PROT 6.0 (L)  03/03/2022 1214   ALBUMIN 3.9 03/03/2022 1214   AST 17 03/03/2022 1214   ALT 23 03/03/2022 1214   ALKPHOS 48 03/03/2022 1214   BILITOT 0.3 03/03/2022 1214   GFRNONAA >60 03/03/2022 1214    ASSESSMENT and PLAN:  Hannah Murray is a 47 y.o. female who returns for a follow up for right breast cancer.   #Right breast intraductal carcinoma: --ER/PR positive, HER2 negative, clinically stage T2 N0 M0 grade 2/3  --Underwent right mastectomy on 09/30/2021 which showed grade 3 invasive ductal carcinoma measuring 4.3 cm in greatest dimension, margins negative along with solid type DCIS high-grade. Right axillary lymph node evaluation showed 1 out of 8 lymph nodes positive for malignancy.  --Oncotype test resulted at 24, benefit of chemotherapy cannot be excluded.  --Recommendation was adjuvant dose dense adriamycin  and cytoxan q 14 days x 4 cycles followed by weekly taxol q 7 days x 12 cycles. --Patient completed adjuvant AC chemotherapy from 11/06/2021-12/23/2021.  --She started adjuvant weekly taxol on 01/06/2022.  --Due for Cycle 10 out of total 12 weekly taxol. Labs today reviewed and adequate for treatment. Mild but stable anemia with Hgb 11.2. No other cytopenias and CMP unremarkable.  --Proceed with treatment without dose modifications.  --RTC in one week with labs and follow up with Dr. 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   No problem-specific Assessment & Plan notes found for this encounter.    All questions were answered. The patient knows to call the clinic with any problems, questions or concerns. We can certainly see the patient much sooner if necessary.  I have spent a total of 30 minutes minutes of face-to-face and  non-face-to-face time, preparing to see the patient, performing a medically appropriate examination, counseling and educating the patient, documenting clinical information in the electronic health record,  and care coordination.   Dede Query PA-C Dept of Hematology and [place] [facility] Phone: [phone]

## 2022-03-11 NOTE — Patient Instructions (Signed)
Instrucciones al darle de alta: Discharge Instructions Gracias por elegir al [facility] de Cncer de [place] para brindarle atencin mdica de oncologa y Music therapist.   Si usted tiene una cita de laboratorio con [place], por favor vaya directamente [place] y regstrese en el rea de Control and instrumentation engineer.   Use ropa cmoda y [place] para tener fcil acceso a las vas del Portacath (acceso venoso de Engineer, site duracin) o la lnea PICC (catter central colocado por va perifrica).   Nos esforzamos por ofrecerle tiempo de calidad con su proveedor. Es posible que tenga que volver a programar su cita si llega tarde (15 minutos o ms).  El llegar tarde le afecta a usted y a otros pacientes cuyas citas son posteriores a Merchandiser, retail.  Adems, si usted falta a tres o ms citas sin avisar a la oficina, puede ser retirado(a) de la clnica a discrecin del proveedor.      Para las solicitudes de renovacin de recetas, pida a su farmacia que se ponga en contacto con nuestra oficina y deje que transcurran 4 horas para que se complete el proceso de las renovaciones.    Hoy usted recibi los siguientes agentes de quimioterapia e/o inmunoterapia: Taxol.       Para ayudar a prevenir las nuseas y los vmitos despus de su tratamiento, le recomendamos que tome su medicamento para las nuseas segn las indicaciones.  LOS SNTOMAS QUE DEBEN COMUNICARSE INMEDIATAMENTE SE INDICAN A CONTINUACIN: *FIEBRE SUPERIOR A 100.4 F (38 C) O MS *ESCALOFROS O SUDORACIN *NUSEAS Y VMITOS QUE NO SE CONTROLAN CON EL MEDICAMENTO PARA LAS NUSEAS *DIFICULTAD INUSUAL PARA RESPIRAR  *MORETONES O HEMORRAGIAS NO HABITUALES *PROBLEMAS URINARIOS (dolor o ardor al Garment/textile technologist o frecuencia para Garment/textile technologist) *PROBLEMAS INTESTINALES (diarrea inusual, estreimiento, dolor cerca del ano) SENSIBILIDAD EN LA BOCA Y EN LA GARGANTA CON O SIN LA PRESENCIA DE LCERAS (dolor de garganta, llagas en la boca o dolor de muelas/dientes) ERUPCIN,  HINCHAZN O DOLORES INUSUALES FLUJO VAGINAL INUSUAL O PICAZN/RASQUIA    Los puntos marcados con un asterisco ( *) indican una posible emergencia y debe hacer un seguimiento tan pronto como le sea posible o vaya al Departamento de Emergencias si se le presenta algn problema.  Por favor, muestre la [place] DE ADVERTENCIA DE [place] Canny DE ADVERTENCIA DE Benay Spice al registrarse en [address] de Emergencias y a la enfermera de triaje.  Si tiene preguntas despus de su visita o necesita cancelar o volver a programar su cita, por favor pngase en contacto con [place]  Dept: [phone] y [place]. Las horas de oficina son de 8:00 a.m. a 4:30 p.m. de lunes a viernes. Por favor, tenga en cuenta que los mensajes de voz que se dejan despus de las 4:00 p.m. posiblemente no se devolvern hasta el siguiente da de [place].  Cerramos los fines de semana y [employer]. En todo momento tiene acceso a una enfermera para preguntas urgentes. Por favor, llame al nmero principal de la clnica Dept: [phone] y [place] instrucciones.   Para cualquier pregunta que no sea de carcter urgente, tambin puede ponerse en contacto con su proveedor [employer] Ahora ofrecemos visitas electrnicas para cualquier persona mayor de 18 aos que solicite atencin mdica en lnea para los sntomas que no sean urgentes. Para ms detalles vaya a [email or web address].   Tambin puede bajar la aplicacin de MyChart! Vaya a la tienda de aplicaciones, busque "MyChart", abra la aplicacin,  seleccione [place], e ingrese con su nombre de usuario y la contrasea de Pharmacist, community.

## 2022-03-17 ENCOUNTER — Other Ambulatory Visit: Payer: Self-pay

## 2022-03-17 ENCOUNTER — Inpatient Hospital Stay: Payer: Self-pay

## 2022-03-17 ENCOUNTER — Inpatient Hospital Stay (HOSPITAL_BASED_OUTPATIENT_CLINIC_OR_DEPARTMENT_OTHER): Payer: Self-pay | Admitting: Hematology and Oncology

## 2022-03-17 ENCOUNTER — Encounter: Payer: Self-pay | Admitting: Hematology and Oncology

## 2022-03-17 DIAGNOSIS — Z95828 Presence of other vascular implants and grafts: Secondary | ICD-10-CM

## 2022-03-17 DIAGNOSIS — C50411 Malignant neoplasm of upper-outer quadrant of right female breast: Secondary | ICD-10-CM

## 2022-03-17 LAB — CBC WITH DIFFERENTIAL (CANCER CENTER ONLY)
Abs Immature Granulocytes: 0.02 10*3/uL (ref 0.00–0.07)
Basophils Absolute: 0 10*3/uL (ref 0.0–0.1)
Basophils Relative: 1 %
Eosinophils Absolute: 0.1 10*3/uL (ref 0.0–0.5)
Eosinophils Relative: 3 %
HCT: 33.3 % — ABNORMAL LOW (ref 36.0–46.0)
Hemoglobin: 10.9 g/dL — ABNORMAL LOW (ref 12.0–15.0)
Immature Granulocytes: 1 %
Lymphocytes Relative: 20 %
Lymphs Abs: 0.7 10*3/uL (ref 0.7–4.0)
MCH: 28.7 pg (ref 26.0–34.0)
MCHC: 32.7 g/dL (ref 30.0–36.0)
MCV: 87.6 fL (ref 80.0–100.0)
Monocytes Absolute: 0.2 10*3/uL (ref 0.1–1.0)
Monocytes Relative: 7 %
Neutro Abs: 2.4 10*3/uL (ref 1.7–7.7)
Neutrophils Relative %: 68 %
Platelet Count: 264 10*3/uL (ref 150–400)
RBC: 3.8 MIL/uL — ABNORMAL LOW (ref 3.87–5.11)
RDW: 13.6 % (ref 11.5–15.5)
WBC Count: 3.5 10*3/uL — ABNORMAL LOW (ref 4.0–10.5)
nRBC: 0 % (ref 0.0–0.2)

## 2022-03-17 LAB — CMP (CANCER CENTER ONLY)
ALT: 12 U/L (ref 0–44)
AST: 13 U/L — ABNORMAL LOW (ref 15–41)
Albumin: 3.6 g/dL (ref 3.5–5.0)
Alkaline Phosphatase: 52 U/L (ref 38–126)
Anion gap: 5 (ref 5–15)
BUN: 12 mg/dL (ref 6–20)
CO2: 27 mmol/L (ref 22–32)
Calcium: 8.7 mg/dL — ABNORMAL LOW (ref 8.9–10.3)
Chloride: 105 mmol/L (ref 98–111)
Creatinine: 0.57 mg/dL (ref 0.44–1.00)
GFR, Estimated: >60 mL/min (ref >60)
Glucose, Bld: 98 mg/dL (ref 70–99)
Potassium: 3.6 mmol/L (ref 3.5–5.1)
Sodium: 137 mmol/L (ref 135–145)
Total Bilirubin: 0.3 mg/dL (ref 0.3–1.2)
Total Protein: 6 g/dL — ABNORMAL LOW (ref 6.5–8.1)

## 2022-03-17 MED ORDER — SODIUM CHLORIDE 0.9 % IV SOLN
10.0000 mg | Freq: Once | INTRAVENOUS | Status: AC
  Administered 2022-03-17: 10 mg via INTRAVENOUS
  Filled 2022-03-17: qty 10

## 2022-03-17 MED ORDER — SODIUM CHLORIDE 0.9 % IV SOLN
80.0000 mg/m2 | Freq: Once | INTRAVENOUS | Status: AC
  Administered 2022-03-17: 138 mg via INTRAVENOUS
  Filled 2022-03-17: qty 23

## 2022-03-17 MED ORDER — FAMOTIDINE IN NACL 20-0.9 MG/50ML-% IV SOLN
20.0000 mg | Freq: Once | INTRAVENOUS | Status: AC
  Administered 2022-03-17: 20 mg via INTRAVENOUS
  Filled 2022-03-17: qty 50

## 2022-03-17 MED ORDER — CETIRIZINE HCL 10 MG/ML IV SOLN
10.0000 mg | Freq: Once | INTRAVENOUS | Status: AC
  Administered 2022-03-17: 10 mg via INTRAVENOUS
  Filled 2022-03-17: qty 1

## 2022-03-17 MED ORDER — HEPARIN SOD (PORK) LOCK FLUSH 100 UNIT/ML IV SOLN
500.0000 [IU] | Freq: Once | INTRAVENOUS | Status: AC | PRN
  Administered 2022-03-17: 500 [IU]

## 2022-03-17 MED ORDER — SODIUM CHLORIDE 0.9% FLUSH
10.0000 mL | INTRAVENOUS | Status: DC | PRN
  Administered 2022-03-17: 10 mL

## 2022-03-17 MED ORDER — SODIUM CHLORIDE 0.9% FLUSH
10.0000 mL | Freq: Once | INTRAVENOUS | Status: AC
  Administered 2022-03-17: 10 mL

## 2022-03-17 MED ORDER — SODIUM CHLORIDE 0.9 % IV SOLN: Freq: Once | INTRAVENOUS | Status: AC

## 2022-03-17 NOTE — Progress Notes (Signed)
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 Indeterminate mass in the right breast at 12:30, which may represent a fibroadenoma. No evidence of right axillary adenopathy.   05/16/2021 Pathology Results   Right breast 10 0 clock mass biopsy showed invasive mammary carcinoma, ductal phenotype prognostics include ER 95% strong staining intensity PR 95% strong staining intensity Ki-67 of 40% and HER2 negative   06/11/2021 Murray Staging   Staging form: Breast, AJCC 8th Edition - Pathologic: Stage IIA (pT2, pN1a, cM0, G3, ER+, PR+, HER2-, Oncotype DX score: 24) - Signed by [name], MD on 11/04/2021 Multigene prognostic tests performed: Oncotype DX Recurrence score range: Greater than or equal to 11 Histologic grading system: 3 grade system   07/10/2021 Genetic Testing   Negative hereditary Murray genetic testing: no pathogenic variants detected [facility] Breast STAT Panel or Common Hereditary Cancers +RNA Panel.  Report dates are Jul 10, 2021 and Jul 18, 2021.   The STAT Breast Murray panel offered by [facility] includes sequencing and rearrangement analysis for the following 9 genes:  ATM, BRCA1, BRCA2, CDH1, CHEK2, PALB2, PTEN, STK11 and TP53.   The Common  Hereditary Cancers + RNA Panel offered by [facility] includes sequencing, deletion/duplication, and RNA testing of the following 47 genes: APC, ATM, AXIN2, BARD1, BMPR1A, BRCA1, BRCA2, BRIP1, CDH1, CDK4*, CDKN2A (p14ARF)*, CDKN2A (p16INK4a)*, CHEK2, CTNNA1, DICER1, EPCAM (Deletion/duplication testing only), GREM1 (promoter region deletion/duplication testing only), KIT, MEN1, MLH1, MSH2, MSH3, MSH6, MUTYH, NBN, NF1, NHTL1, PALB2, PDGFRA*, PMS2, POLD1, POLE, PTEN, RAD50, RAD51C, RAD51D, SDHB, SDHC, SDHD, SMAD4, SMARCA4. STK11, TP53, TSC1, TSC2, and VHL.  The following genes were evaluated for sequence changes only: SDHA and HOXB13 c.251G>A variant only.  RNA analysis is not performed for the * genes.     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  Right axillary lymph node evaluation showed 1 out of 8 lymph nodes positive for malignancy., final pathologic staging T2N1A.    11/11/2021 -  Chemotherapy   Patient is on Treatment Plan : BREAST ADJUVANT DOSE DENSE AC q14d / PACLitaxel q7d       CURRENT THERAPY: Taxol q 7 days  INTERVAL HISTORY:  Hannah Murray 47 y.o. female returns for f/u prior to receiving her weekly taxol. She is unaccompanied for this visit but has video translator present during visit She is doing really well. No nausea, vomiting, diarrhea. No neuropathy reported. She is excited, she will be done with the treatment next week.  Rest of the ROS reviewed and negative.   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  MEDICAL HISTORY: Past Medical History:  Diagnosis Date   Medical history  non-contributory     SURGICAL HISTORY: Past Surgical History:  Procedure Laterality Date   BREAST RECONSTRUCTION WITH PLACEMENT OF TISSUE EXPANDER AND FLEX HD (ACELLULAR HYDRATED DERMIS) Right 09/30/2021   Procedure: BREAST RECONSTRUCTION WITH PLACEMENT OF TISSUE EXPANDER AND FLEX HD (ACELLULAR HYDRATED DERMIS);  Surgeon: Wallace Going, DO;  Location: [facility];  Service: Plastics;  Laterality: Right;   MASTECTOMY W/ SENTINEL NODE BIOPSY Right 09/30/2021   Procedure: RIGHT MASTECTOMY WITH SENTINEL LYMPH NODE BIOPSY;  Surgeon: Jovita Kussmaul, MD;  Location: [facility];  Service: General;  Laterality: Right;   PORTACATH PLACEMENT Left 11/08/2021   Procedure: INSERTION PORT-A-CATH;  Surgeon: Jovita Kussmaul, MD;  Location: [facility];  Service: General;  Laterality: Left;    SOCIAL HISTORY: Social History   Socioeconomic History   Marital status: Married    Spouse name: Not on file   Number of children: 1   Years of education: Not on file   Highest education level: High school graduate  Occupational History   Not on file  Tobacco Use   Smoking status: Never   Smokeless tobacco: Never  Vaping Use   Vaping Use: Never used  Substance and Sexual Activity   Alcohol use: Yes    Comment: occasionally   Drug use: Never   Sexual activity: Not Currently  Other Topics Concern   Not on file  Social History Narrative   Not on file   Social Determinants of Health   Financial Resource Strain: Not on file  Food Insecurity: No Food Insecurity (04/30/2021)   Hunger Vital Sign    Worried About Running Out of Food in the Last Year: Never true    Ran Out of Food in the Last Year: Never true  Transportation Needs: No Transportation Needs (04/30/2021)   PRAPARE - Hydrologist (Medical): No    Lack of Transportation (Non-Medical): No  Physical Activity: Not on file  Stress: Not on file  Social Connections: Not on  file  Intimate Partner Violence: Not At Risk (07/02/2021)   Humiliation, Afraid, Rape, and Kick questionnaire    Fear of Current or Ex-Partner: No    Emotionally Abused: No    Physically Abused: No    Sexually Abused: No    FAMILY HISTORY: Family History  Problem Relation Age of Onset   Hypertension Father    Diabetes Father    Thyroid disease Father    Ovarian Murray Other        MGM's niece; d. > 41   REVIEW OF SYSTEMS:   Constitutional: Negative for appetite change, fatigue, chills, fever and unexpected weight change HENT: Negative for mouth sores, nosebleeds, sore throat and trouble swallowing.   Eyes: Negative for eye problems and icterus.  Respiratory: Negative for hemoptysis, shortness of breath and wheezing. +Mild cough mainly with infusion.  Cardiovascular: Negative for chest pain and leg swelling.  Gastrointestinal: Negative for abdominal pain, constipation, diarrhea, nausea and vomiting.  Genitourinary: Negative for bladder incontinence, difficulty urinating, dysuria, frequency and hematuria.   Musculoskeletal: Negative for back pain, gait problem, neck pain and neck stiffness.  Skin:Negative for rash and ulcers Neurological: Negative for dizziness, extremity weakness, gait problem, headaches, light-headedness and seizures.  Hematological: Negative for adenopathy. Does not bruise/bleed easily.  Psychiatric/Behavioral: Negative for confusion, depression and sleep disturbance. The patient is not nervous/anxious.    PHYSICAL EXAMINATION  ECOG PERFORMANCE STATUS: 1 - Symptomatic but completely ambulatory  Vitals:   03/17/22 1344  BP: 125/65  Pulse: 87  Resp: 16  Temp: 98.1 F (36.7 C)  SpO2: 100%    Physical Exam Constitutional:      Appearance: Normal appearance.  Cardiovascular:     Rate and Rhythm: Normal rate and regular rhythm.     Pulses: Normal pulses.     Heart sounds: Normal heart sounds.  Pulmonary:     Effort: Pulmonary effort is normal.      Breath sounds: Normal breath sounds.  Abdominal:     General: Abdomen is flat. Bowel sounds are normal.  Musculoskeletal:     Cervical back: Normal range of motion. No rigidity.  Lymphadenopathy:     Cervical: No cervical adenopathy.  Skin:    Coloration: Skin is not jaundiced.  Neurological:     General: No focal deficit present.     Mental Status: She is alert.     LABORATORY DATA:  CBC    Component Value Date/Time   WBC 3.5 (L) 03/17/2022 1306   WBC 9.3 12/20/2021 0900   RBC 3.80 (L) 03/17/2022 1306   HGB 10.9 (L) 03/17/2022 1306   HCT 33.3 (L) 03/17/2022 1306   PLT 264 03/17/2022 1306   MCV 87.6 03/17/2022 1306   MCH 28.7 03/17/2022 1306   MCHC 32.7 03/17/2022 1306   RDW 13.6 03/17/2022 1306   LYMPHSABS 0.7 03/17/2022 1306   MONOABS 0.2 03/17/2022 1306   EOSABS 0.1 03/17/2022 1306   BASOSABS 0.0 03/17/2022 1306    CMP     Component Value Date/Time   NA 137 03/17/2022 1306   K 3.6 03/17/2022 1306   CL 105 03/17/2022 1306   CO2 27 03/17/2022 1306   GLUCOSE 98 03/17/2022 1306   BUN 12 03/17/2022 1306   CREATININE 0.57 03/17/2022 1306   CALCIUM 8.7 (L) 03/17/2022 1306   PROT 6.0 (L) 03/17/2022 1306   ALBUMIN 3.6 03/17/2022 1306   AST 13 (L) 03/17/2022 1306   ALT 12 03/17/2022 1306   ALKPHOS 52 03/17/2022 1306   BILITOT 0.3 03/17/2022 1306   GFRNONAA >60 03/17/2022 1306    ASSESSMENT and PLAN:  Hannah Murray is a 47 y.o. female who returns for a follow up for right breast Murray.   #Right breast intraductal carcinoma: --ER/PR positive, HER2 negative, clinically stage T2 N0 M0 grade 2/3  --Underwent right mastectomy on 09/30/2021 which showed grade 3 invasive ductal carcinoma measuring 4.3 cm in greatest dimension, margins negative along with solid type DCIS high-grade. Right axillary lymph node evaluation showed 1 out of 8 lymph nodes positive for malignancy.  --Oncotype test resulted at 24, benefit of chemotherapy cannot be excluded.  --Recommendation was  adjuvant dose dense adriamycin and cytoxan q 14 days x 4 cycles followed by weekly taxol q 7 days x 12 cycles. --Patient completed adjuvant AC chemotherapy from 11/06/2021-12/23/2021.  --She started adjuvant weekly taxol on 01/06/2022.  --Due for Cycle 11 out of total 12 weekly taxol. Labs today reviewed and adequate for treatment.  --Proceed with treatment without dose modifications.  --RTC next week for last cycle of taxol.  #Right hand pain and numbness in fingertips: --Likely carpal tunnel, unlikely taxol related. --No change from prior visit and no interference with grip or dexterity so okay to monitor  --  ok to continue treatment as planned.  No problem-specific Assessment & Plan notes found for this encounter.   All questions were answered. The patient knows to call the clinic with any problems, questions or concerns. We can certainly see the patient much sooner if necessary.  I have spent a total of 20 minutes minutes of face-to-face and non-face-to-face time, preparing to see the patient, performing a medically appropriate examination, counseling and educating the patient, documenting clinical information in the electronic health record,  and care coordination.

## 2022-03-17 NOTE — Patient Instructions (Signed)
Instrucciones al darle de alta: Discharge Instructions Gracias por elegir al [facility] [place] para brindarle atencin mdica de oncologa y Music therapist.   Si usted tiene una cita de laboratorio con [place], por favor vaya directamente [place] y regstrese en el rea de Control and instrumentation engineer.   Use ropa cmoda y [place] para tener fcil acceso a las vas del Portacath (acceso venoso de Engineer, site duracin) o la lnea PICC (catter central colocado por va perifrica).   Nos esforzamos por ofrecerle tiempo de calidad con su proveedor. Es posible que tenga que volver a programar su cita si llega tarde (15 minutos o ms).  El llegar tarde le afecta a usted y a otros pacientes cuyas citas son posteriores a Merchandiser, retail.  Adems, si usted falta a tres o ms citas sin avisar a la oficina, puede ser retirado(a) de la clnica a discrecin del proveedor.      Para las solicitudes de renovacin de recetas, pida a su farmacia que se ponga en contacto con nuestra oficina y deje que transcurran 55 horas para que se complete el proceso de las renovaciones.    Hoy usted recibi los siguientes agentes de quimioterapia e/o inmunoterapia: Paclitaxel.      Para ayudar a prevenir las nuseas y los vmitos despus de su tratamiento, le recomendamos que tome su medicamento para las nuseas segn las indicaciones.  LOS SNTOMAS QUE DEBEN COMUNICARSE INMEDIATAMENTE SE INDICAN A CONTINUACIN: *FIEBRE SUPERIOR A 100.4 F (38 C) O MS *ESCALOFROS O SUDORACIN *NUSEAS Y VMITOS QUE NO SE CONTROLAN CON EL MEDICAMENTO PARA LAS NUSEAS *DIFICULTAD INUSUAL PARA RESPIRAR  *MORETONES O HEMORRAGIAS NO HABITUALES *PROBLEMAS URINARIOS (dolor o ardor al Garment/textile technologist o frecuencia para Garment/textile technologist) *PROBLEMAS INTESTINALES (diarrea inusual, estreimiento, dolor cerca del ano) SENSIBILIDAD EN LA BOCA Y EN LA GARGANTA CON O SIN LA PRESENCIA DE LCERAS (dolor de garganta, llagas en la boca o dolor de muelas/dientes) ERUPCIN,  HINCHAZN O DOLORES INUSUALES FLUJO VAGINAL INUSUAL O PICAZN/RASQUIA    Los puntos marcados con un asterisco ( *) indican una posible emergencia y debe hacer un seguimiento tan pronto como le sea posible o vaya al Departamento de Emergencias si se le presenta algn problema.  Por favor, muestre la [place] DE ADVERTENCIA DE Windy Canny DE ADVERTENCIA DE Benay Spice al registrarse en [address] de Emergencias y a la enfermera de triaje.  Si tiene preguntas despus de su visita o necesita cancelar o volver a programar su cita, por favor pngase en contacto con [place]  Dept: [phone] y [place] instrucciones. Las horas de oficina son de 8:00 a.m. a 4:30 p.m. de lunes a viernes. Por favor, tenga en cuenta que los mensajes de voz que se dejan despus de las 4:00 p.m. posiblemente no se devolvern hasta el siguiente da de [place].  Cerramos los fines de semana y [employer]. En todo momento tiene acceso a una enfermera para preguntas urgentes. Por favor, llame al nmero principal de la clnica Dept: [phone] y [place] instrucciones.   Para cualquier pregunta que no sea de carcter urgente, tambin puede ponerse en contacto con su proveedor [employer] Ahora ofrecemos visitas electrnicas para cualquier persona mayor de 18 aos que solicite atencin mdica en lnea para los sntomas que no sean urgentes. Para ms detalles vaya a [email or web address].   Tambin puede bajar la aplicacin de MyChart! Vaya a la tienda de aplicaciones, busque "MyChart", abra la  aplicacin, seleccione [place], e ingrese con su nombre de usuario y la contrasea de Pharmacist, community.

## 2022-03-18 ENCOUNTER — Other Ambulatory Visit: Payer: Self-pay

## 2022-03-18 ENCOUNTER — Ambulatory Visit: Payer: Self-pay | Admitting: Physician Assistant

## 2022-03-18 ENCOUNTER — Telehealth: Payer: Self-pay

## 2022-03-18 ENCOUNTER — Ambulatory Visit: Payer: Self-pay

## 2022-03-18 NOTE — Telephone Encounter (Signed)
Received an inbox message from [facility] that Hannah Murray was expecting to hear from me. She was released from formal PT as she was doing well, but was advised to follow up with me if she had questions or concerns.  Called pt this am, but there was no answer. Left a message for pt to call me back

## 2022-03-21 MED FILL — Dexamethasone Sodium Phosphate Inj 100 MG/10ML: INTRAMUSCULAR | Qty: 1 | Status: AC

## 2022-03-24 ENCOUNTER — Encounter: Payer: Self-pay | Admitting: Hematology and Oncology

## 2022-03-24 ENCOUNTER — Encounter: Payer: Self-pay | Admitting: *Deleted

## 2022-03-24 ENCOUNTER — Other Ambulatory Visit: Payer: Self-pay

## 2022-03-24 ENCOUNTER — Inpatient Hospital Stay (HOSPITAL_BASED_OUTPATIENT_CLINIC_OR_DEPARTMENT_OTHER): Payer: Self-pay | Admitting: Hematology and Oncology

## 2022-03-24 ENCOUNTER — Inpatient Hospital Stay: Payer: Self-pay

## 2022-03-24 DIAGNOSIS — C50411 Malignant neoplasm of upper-outer quadrant of right female breast: Secondary | ICD-10-CM

## 2022-03-24 DIAGNOSIS — Z95828 Presence of other vascular implants and grafts: Secondary | ICD-10-CM

## 2022-03-24 LAB — CMP (CANCER CENTER ONLY)
ALT: 14 U/L (ref 0–44)
AST: 14 U/L — ABNORMAL LOW (ref 15–41)
Albumin: 3.9 g/dL (ref 3.5–5.0)
Alkaline Phosphatase: 52 U/L (ref 38–126)
Anion gap: 6 (ref 5–15)
BUN: 15 mg/dL (ref 6–20)
CO2: 28 mmol/L (ref 22–32)
Calcium: 9.3 mg/dL (ref 8.9–10.3)
Chloride: 104 mmol/L (ref 98–111)
Creatinine: 0.62 mg/dL (ref 0.44–1.00)
GFR, Estimated: >60 mL/min (ref >60)
Glucose, Bld: 89 mg/dL (ref 70–99)
Potassium: 3.7 mmol/L (ref 3.5–5.1)
Sodium: 138 mmol/L (ref 135–145)
Total Bilirubin: 0.3 mg/dL (ref 0.3–1.2)
Total Protein: 6.9 g/dL (ref 6.5–8.1)

## 2022-03-24 LAB — CBC WITH DIFFERENTIAL (CANCER CENTER ONLY)
Abs Immature Granulocytes: 0.02 10*3/uL (ref 0.00–0.07)
Basophils Absolute: 0 10*3/uL (ref 0.0–0.1)
Basophils Relative: 0 %
Eosinophils Absolute: 0.1 10*3/uL (ref 0.0–0.5)
Eosinophils Relative: 2 %
HCT: 35.5 % — ABNORMAL LOW (ref 36.0–46.0)
Hemoglobin: 11.7 g/dL — ABNORMAL LOW (ref 12.0–15.0)
Immature Granulocytes: 0 %
Lymphocytes Relative: 16 %
Lymphs Abs: 0.7 10*3/uL (ref 0.7–4.0)
MCH: 28.4 pg (ref 26.0–34.0)
MCHC: 33 g/dL (ref 30.0–36.0)
MCV: 86.2 fL (ref 80.0–100.0)
Monocytes Absolute: 0.3 10*3/uL (ref 0.1–1.0)
Monocytes Relative: 7 %
Neutro Abs: 3.5 10*3/uL (ref 1.7–7.7)
Neutrophils Relative %: 75 %
Platelet Count: 295 10*3/uL (ref 150–400)
RBC: 4.12 MIL/uL (ref 3.87–5.11)
RDW: 13.7 % (ref 11.5–15.5)
WBC Count: 4.7 10*3/uL (ref 4.0–10.5)
nRBC: 0 % (ref 0.0–0.2)

## 2022-03-24 MED ORDER — SODIUM CHLORIDE 0.9 % IV SOLN: Freq: Once | INTRAVENOUS | Status: AC

## 2022-03-24 MED ORDER — SODIUM CHLORIDE 0.9% FLUSH
10.0000 mL | INTRAVENOUS | Status: DC | PRN
  Administered 2022-03-24: 10 mL

## 2022-03-24 MED ORDER — CETIRIZINE HCL 10 MG/ML IV SOLN
10.0000 mg | Freq: Once | INTRAVENOUS | Status: AC
  Administered 2022-03-24: 10 mg via INTRAVENOUS
  Filled 2022-03-24: qty 1

## 2022-03-24 MED ORDER — HEPARIN SOD (PORK) LOCK FLUSH 100 UNIT/ML IV SOLN
500.0000 [IU] | Freq: Once | INTRAVENOUS | Status: AC | PRN
  Administered 2022-03-24: 500 [IU]

## 2022-03-24 MED ORDER — SODIUM CHLORIDE 0.9% FLUSH
10.0000 mL | Freq: Once | INTRAVENOUS | Status: AC
  Administered 2022-03-24: 10 mL

## 2022-03-24 MED ORDER — SODIUM CHLORIDE 0.9 % IV SOLN
80.0000 mg/m2 | Freq: Once | INTRAVENOUS | Status: AC
  Administered 2022-03-24: 138 mg via INTRAVENOUS
  Filled 2022-03-24: qty 23

## 2022-03-24 MED ORDER — SODIUM CHLORIDE 0.9 % IV SOLN
10.0000 mg | Freq: Once | INTRAVENOUS | Status: AC
  Administered 2022-03-24: 10 mg via INTRAVENOUS
  Filled 2022-03-24: qty 10

## 2022-03-24 MED ORDER — FAMOTIDINE IN NACL 20-0.9 MG/50ML-% IV SOLN
20.0000 mg | Freq: Once | INTRAVENOUS | Status: AC
  Administered 2022-03-24: 20 mg via INTRAVENOUS
  Filled 2022-03-24: qty 50

## 2022-03-24 NOTE — Progress Notes (Signed)
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 Indeterminate mass in the right breast at 12:30, which may represent a fibroadenoma. No evidence of right axillary adenopathy.   05/16/2021 Pathology Results   Right breast 10 0 clock mass biopsy showed invasive mammary carcinoma, ductal phenotype prognostics include ER 95% strong staining intensity PR 95% strong staining intensity Ki-67 of 40% and HER2 negative   06/11/2021 Cancer Staging   Staging form: Breast, AJCC 8th Edition - Pathologic: Stage IIA (pT2, pN1a, cM0, G3, ER+, PR+, HER2-, Oncotype DX score: 24) - Signed by [name], MD on 11/04/2021 Multigene prognostic tests performed: Oncotype DX Recurrence score range: Greater than or equal to 11 Histologic grading system: 3 grade system   07/10/2021 Genetic Testing   Negative hereditary cancer genetic testing: no pathogenic variants detected [facility] Breast STAT Panel or Common Hereditary Cancers +RNA Panel.  Report dates are Jul 10, 2021 and Jul 18, 2021.   The STAT Breast cancer panel offered by [facility] includes sequencing and rearrangement analysis for the following 9 genes:  ATM, BRCA1, BRCA2, CDH1, CHEK2, PALB2, PTEN, STK11 and TP53.   The Common  Hereditary Cancers + RNA Panel offered by [facility] includes sequencing, deletion/duplication, and RNA testing of the following 47 genes: APC, ATM, AXIN2, BARD1, BMPR1A, BRCA1, BRCA2, BRIP1, CDH1, CDK4*, CDKN2A (p14ARF)*, CDKN2A (p16INK4a)*, CHEK2, CTNNA1, DICER1, EPCAM (Deletion/duplication testing only), GREM1 (promoter region deletion/duplication testing only), KIT, MEN1, MLH1, MSH2, MSH3, MSH6, MUTYH, NBN, NF1, NHTL1, PALB2, PDGFRA*, PMS2, POLD1, POLE, PTEN, RAD50, RAD51C, RAD51D, SDHB, SDHC, SDHD, SMAD4, SMARCA4. STK11, TP53, TSC1, TSC2, and VHL.  The following genes were evaluated for sequence changes only: SDHA and HOXB13 c.251G>A variant only.  RNA analysis is not performed for the * genes.     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  Right axillary lymph node evaluation showed 1 out of 8 lymph nodes positive for malignancy., final pathologic staging T2N1A.    11/11/2021 -  Chemotherapy   Patient is on Treatment Plan : BREAST ADJUVANT DOSE DENSE AC q14d / PACLitaxel q7d       CURRENT THERAPY: Taxol q 7 days  INTERVAL HISTORY:  Hannah Murray 47 y.o. female returns for f/u prior to receiving her weekly taxol. She is unaccompanied for this visit but has video translator present during visit She is doing really well. No nausea, vomiting, diarrhea. No neuropathy reported. She is excited, she will be done with the treatment today  Rest of the ROS reviewed and negative.   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  MEDICAL HISTORY: Past Medical History:  Diagnosis Date   Medical history  non-contributory     SURGICAL HISTORY: Past Surgical History:  Procedure Laterality Date   BREAST RECONSTRUCTION WITH PLACEMENT OF TISSUE EXPANDER AND FLEX HD (ACELLULAR HYDRATED DERMIS) Right 09/30/2021   Procedure: BREAST RECONSTRUCTION WITH PLACEMENT OF TISSUE EXPANDER AND FLEX HD (ACELLULAR HYDRATED DERMIS);  Surgeon: Wallace Going, DO;  Location: [place] [facility];  Service: Plastics;  Laterality: Right;   MASTECTOMY W/ SENTINEL NODE BIOPSY Right 09/30/2021   Procedure: RIGHT MASTECTOMY WITH SENTINEL LYMPH NODE BIOPSY;  Surgeon: Jovita Kussmaul, MD;  Location: [facility];  Service: General;  Laterality: Right;   PORTACATH PLACEMENT Left 11/08/2021   Procedure: INSERTION PORT-A-CATH;  Surgeon: Jovita Kussmaul, MD;  Location: [facility];  Service: General;  Laterality: Left;    SOCIAL HISTORY: Social History   Socioeconomic History   Marital status: Married    Spouse name: Not on file   Number of children: 1   Years of education: Not on file   Highest education level: High school graduate  Occupational History   Not on file  Tobacco Use   Smoking status: Never   Smokeless tobacco: Never  Vaping Use   Vaping Use: Never used  Substance and Sexual Activity   Alcohol use: Yes    Comment: occasionally   Drug use: Never   Sexual activity: Not Currently  Other Topics Concern   Not on file  Social History Narrative   Not on file   Social Determinants of Health   Financial Resource Strain: Not on file  Food Insecurity: No Food Insecurity (04/30/2021)   Hunger Vital Sign    Worried About Running Out of Food in the Last Year: Never true    Ran Out of Food in the Last Year: Never true  Transportation Needs: No Transportation Needs (04/30/2021)   PRAPARE - Hydrologist (Medical): No    Lack of Transportation (Non-Medical): No  Physical Activity: Not on file  Stress: Not on file  Social Connections: Not on  file  Intimate Partner Violence: Not At Risk (07/02/2021)   Humiliation, Afraid, Rape, and Kick questionnaire    Fear of Current or Ex-Partner: No    Emotionally Abused: No    Physically Abused: No    Sexually Abused: No    FAMILY HISTORY: Family History  Problem Relation Age of Onset   Hypertension Father    Diabetes Father    Thyroid disease Father    Ovarian cancer Other        MGM's niece; d. > 46   REVIEW OF SYSTEMS:   Constitutional: Negative for appetite change, fatigue, chills, fever and unexpected weight change HENT: Negative for mouth sores, nosebleeds, sore throat and trouble swallowing.   Eyes: Negative for eye problems and icterus.  Respiratory: Negative for hemoptysis, shortness of breath and wheezing. +Mild cough mainly with infusion.  Cardiovascular: Negative for chest pain and leg swelling.  Gastrointestinal: Negative for abdominal pain, constipation, diarrhea, nausea and vomiting.  Genitourinary: Negative for bladder incontinence, difficulty urinating, dysuria, frequency and hematuria.   Musculoskeletal: Negative for back pain, gait problem, neck pain and neck stiffness.  Skin:Negative for rash and ulcers Neurological: Negative for dizziness, extremity weakness, gait problem, headaches, light-headedness and seizures.  Hematological: Negative for adenopathy. Does not bruise/bleed easily.  Psychiatric/Behavioral: Negative for confusion, depression and sleep disturbance. The patient is not nervous/anxious.    PHYSICAL EXAMINATION  ECOG PERFORMANCE STATUS: 1 - Symptomatic but completely ambulatory  Vitals:   03/24/22 1309  BP: 125/73  Pulse: 85  Resp: 16  Temp: 97.9 F (36.6 C)  SpO2: 96%    Physical Exam Constitutional:      Appearance: Normal appearance.  Cardiovascular:     Rate and Rhythm: Normal rate and regular rhythm.     Pulses: Normal pulses.     Heart sounds: Normal heart sounds.  Pulmonary:     Effort: Pulmonary effort is normal.      Breath sounds: Normal breath sounds.  Abdominal:     General: Abdomen is flat. Bowel sounds are normal.  Musculoskeletal:     Cervical back: Normal range of motion. No rigidity.  Lymphadenopathy:     Cervical: No cervical adenopathy.  Skin:    Coloration: Skin is not jaundiced.  Neurological:     General: No focal deficit present.     Mental Status: She is alert.     LABORATORY DATA:  CBC    Component Value Date/Time   WBC 4.7 03/24/2022 1248   WBC 9.3 12/20/2021 0900   RBC 4.12 03/24/2022 1248   HGB 11.7 (L) 03/24/2022 1248   HCT 35.5 (L) 03/24/2022 1248   PLT 295 03/24/2022 1248   MCV 86.2 03/24/2022 1248   MCH 28.4 03/24/2022 1248   MCHC 33.0 03/24/2022 1248   RDW 13.7 03/24/2022 1248   LYMPHSABS 0.7 03/24/2022 1248   MONOABS 0.3 03/24/2022 1248   EOSABS 0.1 03/24/2022 1248   BASOSABS 0.0 03/24/2022 1248    CMP     Component Value Date/Time   NA 137 03/17/2022 1306   K 3.6 03/17/2022 1306   CL 105 03/17/2022 1306   CO2 27 03/17/2022 1306   GLUCOSE 98 03/17/2022 1306   BUN 12 03/17/2022 1306   CREATININE 0.57 03/17/2022 1306   CALCIUM 8.7 (L) 03/17/2022 1306   PROT 6.0 (L) 03/17/2022 1306   ALBUMIN 3.6 03/17/2022 1306   AST 13 (L) 03/17/2022 1306   ALT 12 03/17/2022 1306   ALKPHOS 52 03/17/2022 1306   BILITOT 0.3 03/17/2022 1306   GFRNONAA >60 03/17/2022 1306    ASSESSMENT and PLAN:  Hannah Murray is a 47 y.o. female who returns for a follow up for right breast cancer.   #Right breast intraductal carcinoma: --ER/PR positive, HER2 negative, clinically stage T2 N0 M0 grade 2/3  --Underwent right mastectomy on 09/30/2021 which showed grade 3 invasive ductal carcinoma measuring 4.3 cm in greatest dimension, margins negative along with solid type DCIS high-grade. Right axillary lymph node evaluation showed 1 out of 8 lymph nodes positive for malignancy.  --Oncotype test resulted at 24, benefit of chemotherapy cannot be excluded.  --Recommendation was  adjuvant dose dense adriamycin and cytoxan q 14 days x 4 cycles followed by weekly taxol q 7 days x 12 cycles. --Patient completed adjuvant AC chemotherapy from 11/06/2021-12/23/2021.  --She started adjuvant weekly taxol on 01/06/2022.  --Due for Cycle 12 weekly taxol. Labs today reviewed and adequate for treatment.  --Proceed with treatment without dose modifications.  --sent an in basket message to our NN to discuss adjuvant radiation. --She will start anti estrogen therapy after completion of adj radiation  No problem-specific Assessment & Plan notes found for this encounter.   All questions were answered. The patient knows to  call the clinic with any problems, questions or concerns. We can certainly see the patient much sooner if necessary.  I have spent a total of 20 minutes minutes of face-to-face and non-face-to-face time, preparing to see the patient, performing a medically appropriate examination, counseling and educating the patient, documenting clinical information in the electronic health record,  and care coordination.

## 2022-03-24 NOTE — Patient Instructions (Signed)
Instrucciones al darle de alta: Discharge Instructions Gracias por elegir al [facility] de Cncer de [place] para brindarle atencin mdica de oncologa y Music therapist.   Si usted tiene una cita de laboratorio con [facility], por favor vaya directamente [place] y regstrese en el rea de Control and instrumentation engineer.   Use ropa cmoda y [place] para tener fcil acceso a las vas del Portacath (acceso venoso de Engineer, site duracin) o la lnea PICC (catter central colocado por va perifrica).   Nos esforzamos por ofrecerle tiempo de calidad con su proveedor. Es posible que tenga que volver a programar su cita si llega tarde (15 minutos o ms).  El llegar tarde le afecta a usted y a otros pacientes cuyas citas son posteriores a Merchandiser, retail.  Adems, si usted falta a tres o ms citas sin avisar a la oficina, puede ser retirado(a) de la clnica a discrecin del proveedor.      Para las solicitudes de renovacin de recetas, pida a su farmacia que se ponga en contacto con nuestra oficina y deje que transcurran 22 horas para que se complete el proceso de las renovaciones.    Hoy usted recibi los siguientes agentes de quimioterapia e/o inmunoterapia: Paclitaxel      Para ayudar a prevenir las nuseas y los vmitos despus de su tratamiento, le recomendamos que tome su medicamento para las nuseas segn las indicaciones.  LOS SNTOMAS QUE DEBEN COMUNICARSE INMEDIATAMENTE SE INDICAN A CONTINUACIN: *FIEBRE SUPERIOR A 100.4 F (38 C) O MS *ESCALOFROS O SUDORACIN *NUSEAS Y VMITOS QUE NO SE CONTROLAN CON EL MEDICAMENTO PARA LAS NUSEAS *DIFICULTAD INUSUAL PARA RESPIRAR  *MORETONES O HEMORRAGIAS NO HABITUALES *PROBLEMAS URINARIOS (dolor o ardor al Garment/textile technologist o frecuencia para Garment/textile technologist) *PROBLEMAS INTESTINALES (diarrea inusual, estreimiento, dolor cerca del ano) SENSIBILIDAD EN LA BOCA Y EN LA GARGANTA CON O SIN LA PRESENCIA DE LCERAS (dolor de garganta, llagas en la boca o dolor de muelas/dientes) ERUPCIN,  HINCHAZN O DOLORES INUSUALES FLUJO VAGINAL INUSUAL O PICAZN/RASQUIA    Los puntos marcados con un asterisco ( *) indican una posible emergencia y debe hacer un seguimiento tan pronto como le sea posible o vaya al Departamento de Emergencias si se le presenta algn problema.  Por favor, muestre la [place] DE ADVERTENCIA DE Windy Canny DE ADVERTENCIA DE Benay Spice al registrarse en [address] de Emergencias y a la enfermera de triaje.  Si tiene preguntas despus de su visita o necesita cancelar o volver a programar su cita, por favor pngase en contacto con [place]  Dept: [phone] y [place] instrucciones. Las horas de oficina son de 8:00 a.m. a 4:30 p.m. de lunes a viernes. Por favor, tenga en cuenta que los mensajes de voz que se dejan despus de las 4:00 p.m. posiblemente no se devolvern hasta el siguiente da de [place].  Cerramos los fines de semana y [employer]. En todo momento tiene acceso a una enfermera para preguntas urgentes. Por favor, llame al nmero principal de la clnica Dept: [phone] y [place] instrucciones.   Para cualquier pregunta que no sea de carcter urgente, tambin puede ponerse en contacto con su proveedor [employer] Ahora ofrecemos visitas electrnicas para cualquier persona mayor de 18 aos que solicite atencin mdica en lnea para los sntomas que no sean urgentes. Para ms detalles vaya a [email or web address].   Tambin puede bajar la aplicacin de MyChart! Vaya a la tienda de aplicaciones, busque "MyChart", abra la  aplicacin, seleccione [place], e ingrese con su nombre de usuario y la contrasea de Pharmacist, community.

## 2022-03-25 ENCOUNTER — Telehealth: Payer: Self-pay | Admitting: Hematology and Oncology

## 2022-03-25 NOTE — Telephone Encounter (Signed)
Contacted patient to scheduled appointments. Patient is aware of appointments that are scheduled.   

## 2022-04-01 ENCOUNTER — Encounter: Payer: Self-pay | Admitting: *Deleted

## 2022-04-07 ENCOUNTER — Telehealth: Payer: Self-pay

## 2022-04-07 NOTE — Telephone Encounter (Addendum)
Using [employer] ([place]) w/ Mr. Cornelia Copa id# [phone], a detailed voicemail was left requesting that patient take a pregnancy test prior to arriving to her 04/10/22-7:30am appointment w/ Shona Simpson PA-C. I left my extension [phone] and requested that patient return my call to confirm receipt of the voicemail.   Leandra Kern, LPN

## 2022-04-07 NOTE — Progress Notes (Signed)
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   HISTORY OF PRESENT ILLNESS: Hannah Murray is a 47 y.o. female with a diagnosis of right breast cancer. The patient was noted to have a palpable mass in the right breast and further diagnostic work-up showed a mass in the upper outer quadrant.  By ultrasound this was located in the 10 o'clock position measuring at least 3.6 cm, a second hypoechoic mass measuring 1 cm in the 12:30 position was also seen in her right axillary lymph nodes were normal in appearance.  She underwent biopsies on 05/16/2021, the 10 o'clock position biopsy showed an invasive ductal carcinoma that was grade 2-3.  The tumor was ER/PR positive, HER2 negative with a Ki-67 of 40%.  Her biopsy in the 12:30 position showed fibrocystic change negative for carcinoma.  She did undergo an MRI for extent of disease on 06/15/2021 which confirmed the cancer in the upper outer quadrant of the right breast measuring up to 4 cm in greatest dimension, and the indeterminant finding in the right mass measuring 5 mm was noted.  No evidence of any additional disease or findings in the left breast were noted.    Since her last visit, she did undergo another biopsy on 07/08/2021 of the indeterminate 5 mm lesion in the upper inner right breast. This showed a fibroadenoma and no evidence of malignancy.  It was somewhat unclear why but she had a delay in her treatment but she ultimately pursued right mastectomy with tissue expander placement on 09/30/2021.  Final pathology showed grade 3 invasive ductal carcinoma measuring 4.3 cm with negative margins.  She had 8 sampled lymph nodes that were  removed from her axilla and 1 contained metastatic disease.  She also had Oncotype DX performed which showed a recurrence risk of 24, she then received adjuvant chemotherapy between 11/06/2021 and 03/24/2022.  She is seen today to discuss adjuvant radiotherapy to the right chest wall and regional lymph nodes.    PREVIOUS RADIATION THERAPY: No   PAST MEDICAL HISTORY:  Past Medical History:  Diagnosis Date   Medical history non-contributory        PAST SURGICAL HISTORY: Past Surgical History:  Procedure Laterality Date   BREAST RECONSTRUCTION WITH PLACEMENT OF TISSUE EXPANDER AND FLEX HD (ACELLULAR HYDRATED DERMIS) Right 09/30/2021   Procedure: BREAST RECONSTRUCTION WITH PLACEMENT OF TISSUE EXPANDER AND FLEX HD (ACELLULAR HYDRATED DERMIS);  Surgeon: Wallace Going, DO;  Location: [place];  Service: Plastics;  Laterality: Right;   MASTECTOMY W/ SENTINEL NODE BIOPSY Right 09/30/2021   Procedure: RIGHT MASTECTOMY WITH SENTINEL LYMPH NODE BIOPSY;  Surgeon: Jovita Kussmaul, MD;  Location: [facility];  Service: General;  Laterality: Right;   PORTACATH PLACEMENT Left 11/08/2021   Procedure: INSERTION PORT-A-CATH;  Surgeon: Jovita Kussmaul, MD;  Location: [facility];  Service: General;  Laterality: Left;     FAMILY HISTORY:  Family History  Problem Relation Age of Onset   Hypertension Father    Diabetes Father    Thyroid disease Father    Ovarian cancer Other        MGM's niece; d. >  50     SOCIAL HISTORY:  reports that she has never smoked. She has never used smokeless tobacco. She reports current alcohol use. She reports that she does not use drugs. The patient is married and lives in [place].    ALLERGIES: Patient has no known allergies.   MEDICATIONS:  Current Outpatient Medications  Medication Sig Dispense Refill   diphenhydrAMINE (BENADRYL) 25 MG tablet Take 25 mg by mouth every 6 (six) hours as needed for itching.      lidocaine-prilocaine (EMLA) cream Apply to affected area once 30 g 3   ondansetron (ZOFRAN) 8 MG tablet Take 1 tab (8 mg) by mouth every 8 hrs as needed for nausea/vomiting. Start third day after doxorubicin/cyclophosphamide chemotherapy. 30 tablet 1   prochlorperazine (COMPAZINE) 10 MG tablet Take 1 tablet (10 mg total) by mouth every 6 (six) hours as needed for nausea or vomiting. 30 tablet 1   zolpidem (AMBIEN) 5 MG tablet Take 1 tablet (5 mg total) by mouth at bedtime as needed for sleep. 30 tablet 0   No current facility-administered medications for this visit.     REVIEW OF SYSTEMS: On review of systems, the patient reports that she is ***     PHYSICAL EXAM:  Wt Readings from Last 3 Encounters:  03/24/22 154 lb 8 oz (70.1 kg)  03/17/22 151 lb 9.6 oz (68.8 kg)  03/11/22 150 lb 8 oz (68.3 kg)   Temp Readings from Last 3 Encounters:  03/24/22 97.9 F (36.6 C) (Temporal)  03/17/22 98.1 F (36.7 C) (Temporal)  03/11/22 97.7 F (36.5 C) (Temporal)   BP Readings from Last 3 Encounters:  03/24/22 125/73  03/17/22 125/65  03/11/22 137/79   Pulse Readings from Last 3 Encounters:  03/24/22 85  03/17/22 87  03/11/22 94    In general this is a well appearing Hispanic female in no acute distress. She's alert and oriented x4 and appropriate throughout the examination. Cardiopulmonary assessment is negative for acute distress and she exhibits normal effort.  Her right chest wall incision is well-healed with an in situ tissue expander.  No erythema, separation, or drainage is appreciated.  Her axillary***    ECOG = ***  0 - Asymptomatic (Fully active, able to carry on all predisease activities without restriction)  1 - Symptomatic but completely ambulatory (Restricted in physically strenuous activity but ambulatory and able to carry out work of a light or sedentary nature. For example, light housework, office work)  2 - Symptomatic, <50% in bed during the day (Ambulatory and  capable of all self care but unable to carry out any work activities. Up and about more than 50% of waking hours)  3 - Symptomatic, >50% in bed, but not bedbound (Capable of only limited self-care, confined to bed or chair 50% or more of waking hours)  4 - Bedbound (Completely disabled. Cannot carry on any self-care. Totally confined to bed or chair)  5 - Death   Eustace Pen MM, Creech RH, Tormey DC, et al. [phone]). "Toxicity and response criteria of the [facility]". [place] Oncol. 5 (6): 649-55    LABORATORY DATA:  Lab Results  Component Value Date   WBC 4.7 03/24/2022   HGB 11.7 (L) 03/24/2022   HCT 35.5 (L) 03/24/2022   MCV 86.2 03/24/2022   PLT 295 03/24/2022   Lab Results  Component Value Date   NA 138 03/24/2022   K 3.7 03/24/2022   CL 104 03/24/2022   CO2 28 03/24/2022  Lab Results  Component Value Date   ALT 14 03/24/2022   AST 14 (L) 03/24/2022   ALKPHOS 52 03/24/2022   BILITOT 0.3 03/24/2022      RADIOGRAPHY: No results found.     IMPRESSION/PLAN: 1. 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 Dr. Lisbeth Renshaw discusses the pathology findings and reviews the nature of right breast disease.  Dr. Lisbeth Renshaw reviews her case then neck steps for biopsy.  He discusses that the approach to surgery impacts the recommendations after surgery regarding radiation.  Dr. Chryl Heck plans Oncotype Dx score to determine a role for systemic therapy.  If she undergoes breast conserving surgery, and chemotherapy is not needed, Dr. Lisbeth Renshaw recommends external radiotherapy to the breast  to reduce risks of local recurrence followed by antiestrogen therapy.  He also discusses that if chemotherapy were necessary radiotherapy would follow that.  We discussed the risks, benefits, short, and long term effects of radiotherapy, as well as the curative intent.   Dr. Lisbeth Renshaw discusses the delivery and logistics of radiotherapy and  anticipates a course of 6 1/2 weeks of radiotherapy to the right breast. We will see her back depending on her surgical decision making and final pathology.  2. Contraceptive Counseling. The patient is sexually active but having some irregularities in cycles. She uses condoms for contraception, and we discussed the need to avoid pregnancy prior to radiation and that we would decide about pregnancy testing prior to radiation depending on her sexual activity. ***  In a visit lasting *** minutes, greater than 50% of the time was spent face to face reviewing her case, as well as in preparation of, discussing, and coordinating the patient's care. The assistance of a medical interpretor was utilized during this visit.      Carola Rhine, [facility]    **Disclaimer: This note was dictated with voice recognition software. Similar sounding words can inadvertently be transcribed and this note may contain transcription errors which may not have been corrected upon publication of note.**

## 2022-04-09 ENCOUNTER — Telehealth: Payer: Self-pay

## 2022-04-09 NOTE — Telephone Encounter (Signed)
Using [employer] ([place]) w/ Ms. Mikle Bosworth interpreter [phone]. I called patient, verified identity and confirmed that patient has taken a new at-home pregnancy test on 04/08/2022 and patient states "The results are negative." I reminded patient of her 7:30am-04/10/2022 in-person appointment w/ Shona Simpson PA-C. Patient verbalized understanding. This concludes the conversation.   Leandra Kern, LPN

## 2022-04-10 ENCOUNTER — Ambulatory Visit: Payer: Self-pay | Admitting: Radiation Oncology

## 2022-04-10 ENCOUNTER — Ambulatory Visit
Admission: RE | Admit: 2022-04-10 | Discharge: 2022-04-10 | Disposition: A | Discharge status: Home / Self Care | Payer: Self-pay | Source: Ambulatory Visit | Attending: Radiation Oncology | Admitting: Radiation Oncology

## 2022-04-10 ENCOUNTER — Other Ambulatory Visit: Payer: Self-pay

## 2022-04-10 ENCOUNTER — Encounter: Payer: Self-pay | Admitting: Radiation Oncology

## 2022-04-10 VITALS — BP 115/71 | HR 85 | Temp 97.1°F | Resp 18 | Ht 65.0 in | Wt 152.5 lb

## 2022-04-10 DIAGNOSIS — Z170 Estrogen receptor positive status [ER+]: Secondary | ICD-10-CM | POA: Insufficient documentation

## 2022-04-10 DIAGNOSIS — C50411 Malignant neoplasm of upper-outer quadrant of right female breast: Secondary | ICD-10-CM | POA: Insufficient documentation

## 2022-04-10 DIAGNOSIS — Z79899 Other long term (current) drug therapy: Secondary | ICD-10-CM | POA: Insufficient documentation

## 2022-04-10 DIAGNOSIS — Z9221 Personal history of antineoplastic chemotherapy: Secondary | ICD-10-CM | POA: Insufficient documentation

## 2022-04-10 DIAGNOSIS — Z8041 Family history of malignant neoplasm of ovary: Secondary | ICD-10-CM | POA: Insufficient documentation

## 2022-04-10 NOTE — Progress Notes (Signed)
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   Patient identity verified. Patient reports no discomfort at this time.  Meaningful use complete. LMP-x4 months ago. Patient states "Pregnancy test on 04/08/2022 was negative."  BP 115/71 (BP Location: Left Arm, Patient Position: Sitting, Cuff Size: Normal)   Pulse 85   Temp (!) 97.1 F (36.2 C) (Temporal)   Resp 18   Ht 5' 5"$  (1.651 m)   Wt 152 lb 8 oz (69.2 kg)   SpO2 100%   BMI 25.38 kg/m   This concludes the interview.   Leandra Kern, LPN

## 2022-04-17 ENCOUNTER — Encounter: Payer: Self-pay | Admitting: *Deleted

## 2022-04-21 ENCOUNTER — Inpatient Hospital Stay: Payer: Self-pay | Attending: Hematology and Oncology | Admitting: Hematology and Oncology

## 2022-04-21 ENCOUNTER — Other Ambulatory Visit: Payer: Self-pay

## 2022-04-21 ENCOUNTER — Ambulatory Visit
Admission: RE | Admit: 2022-04-21 | Discharge: 2022-04-21 | Disposition: A | Discharge status: Home / Self Care | Payer: Self-pay | Source: Ambulatory Visit | Attending: Radiation Oncology | Admitting: Radiation Oncology

## 2022-04-21 VITALS — BP 127/64 | HR 99 | Temp 97.7°F | Resp 16 | Ht 65.0 in | Wt 150.0 lb

## 2022-04-21 DIAGNOSIS — Z170 Estrogen receptor positive status [ER+]: Secondary | ICD-10-CM | POA: Insufficient documentation

## 2022-04-21 DIAGNOSIS — C50411 Malignant neoplasm of upper-outer quadrant of right female breast: Secondary | ICD-10-CM

## 2022-04-21 DIAGNOSIS — Z9011 Acquired absence of right breast and nipple: Secondary | ICD-10-CM | POA: Insufficient documentation

## 2022-04-21 NOTE — Progress Notes (Signed)
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 Indeterminate mass in the right breast at 12:30, which may represent a fibroadenoma. No evidence of right axillary adenopathy.   05/16/2021 Pathology Results   Right breast 10 0 clock mass biopsy showed invasive mammary carcinoma, ductal phenotype prognostics include ER 95% strong staining intensity PR 95% strong staining intensity Ki-67 of 40% and HER2 negative   06/11/2021 Cancer Staging   Staging form: Breast, AJCC 8th Edition - Pathologic: Stage IIA (pT2, pN1a, cM0, G3, ER+, PR+, HER2-, Oncotype DX score: 24) - Signed by [name], MD on 11/04/2021 Multigene prognostic tests performed: Oncotype DX Recurrence score range: Greater than or equal to 11 Histologic grading system: 3 grade system   07/10/2021 Genetic Testing   Negative hereditary cancer genetic testing: no pathogenic variants detected [facility] Breast STAT Panel or Common Hereditary Cancers +RNA Panel.  Report dates are Jul 10, 2021 and Jul 18, 2021.   The STAT Breast cancer panel offered by [facility] includes sequencing and rearrangement analysis for the following 9 genes:  ATM, BRCA1, BRCA2, CDH1, CHEK2, PALB2, PTEN, STK11 and TP53.   The Common  Hereditary Cancers + RNA Panel offered by [facility] includes sequencing, deletion/duplication, and RNA testing of the following 47 genes: APC, ATM, AXIN2, BARD1, BMPR1A, BRCA1, BRCA2, BRIP1, CDH1, CDK4*, CDKN2A (p14ARF)*, CDKN2A (p16INK4a)*, CHEK2, CTNNA1, DICER1, EPCAM (Deletion/duplication testing only), GREM1 (promoter region deletion/duplication testing only), KIT, MEN1, MLH1, MSH2, MSH3, MSH6, MUTYH, NBN, NF1, NHTL1, PALB2, PDGFRA*, PMS2, POLD1, POLE, PTEN, RAD50, RAD51C, RAD51D, SDHB, SDHC, SDHD, SMAD4, SMARCA4. STK11, TP53, TSC1, TSC2, and VHL.  The following genes were evaluated for sequence changes only: SDHA and HOXB13 c.251G>A variant only.  RNA analysis is not performed for the * genes.     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  Right axillary lymph node evaluation showed 1 out of 8 lymph nodes positive for malignancy., final pathologic staging T2N1A.    11/11/2021 -  Chemotherapy   Patient is on Treatment Plan : BREAST ADJUVANT DOSE DENSE AC q14d / PACLitaxel q7d      CURRENT THERAPY: Taxol q 7 days  INTERVAL HISTORY:  Hannah Murray 47 y.o. female returns for f/u.  Since her last visit here, she followed up with Dr. Lisbeth Renshaw and will be starting radiation for about 6-1/2 weeks.  Anticipated simulation today. She otherwise reports some mild tingling in her fingers.  She continues to stay active.  She runs for an hour every day, lifts weights.  She has done this throughout her chemotherapy. Rest of the ROS reviewed and negative.   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  MEDICAL HISTORY: Past Medical History:  Diagnosis Date   Medical history  non-contributory     SURGICAL HISTORY: Past Surgical History:  Procedure Laterality Date   BREAST RECONSTRUCTION WITH PLACEMENT OF TISSUE EXPANDER AND FLEX HD (ACELLULAR HYDRATED DERMIS) Right 09/30/2021   Procedure: BREAST RECONSTRUCTION WITH PLACEMENT OF TISSUE EXPANDER AND FLEX HD (ACELLULAR HYDRATED DERMIS);  Surgeon: Wallace Going, DO;  Location: [facility];  Service: Plastics;  Laterality: Right;   MASTECTOMY W/ SENTINEL NODE BIOPSY Right 09/30/2021   Procedure: RIGHT MASTECTOMY WITH SENTINEL LYMPH NODE BIOPSY;  Surgeon: Jovita Kussmaul, MD;  Location: [facility];  Service: General;  Laterality: Right;   PORTACATH PLACEMENT Left 11/08/2021   Procedure: INSERTION PORT-A-CATH;  Surgeon: Jovita Kussmaul, MD;  Location: [facility];  Service: General;  Laterality: Left;    SOCIAL HISTORY: Social History   Socioeconomic History   Marital status: Married    Spouse name: Not on file   Number of children: 1   Years of education: Not on file   Highest education level: High school graduate  Occupational History   Not on file  Tobacco Use   Smoking status: Never   Smokeless tobacco: Never  Vaping Use   Vaping Use: Never used  Substance and Sexual Activity   Alcohol use: Yes    Comment: occasionally   Drug use: Never   Sexual activity: Not Currently  Other Topics Concern   Not on file  Social History Narrative   Not on file   Social Determinants of Health   Financial Resource Strain: Not on file  Food Insecurity: No Food Insecurity (04/10/2022)   Hunger Vital Sign    Worried About Running Out of Food in the Last Year: Never true    Ran Out of Food in the Last Year: Never true  Transportation Needs: No Transportation Needs (04/10/2022)   PRAPARE - Hydrologist (Medical): No    Lack of Transportation (Non-Medical): No  Physical Activity: Not on file  Stress: Not on file  Social Connections: Not on  file  Intimate Partner Violence: Not At Risk (04/10/2022)   Humiliation, Afraid, Rape, and Kick questionnaire    Fear of Current or Ex-Partner: No    Emotionally Abused: No    Physically Abused: No    Sexually Abused: No    FAMILY HISTORY: Family History  Problem Relation Age of Onset   Hypertension Father    Diabetes Father    Thyroid disease Father    Ovarian cancer Other        MGM's niece; d. > 1   REVIEW OF SYSTEMS:   Constitutional: Negative for appetite change, fatigue, chills, fever and unexpected weight change HENT: Negative for mouth sores, nosebleeds, sore throat and trouble swallowing.   Eyes: Negative for eye problems and icterus.  Respiratory: Negative for hemoptysis, shortness of breath and wheezing. +Mild cough mainly with infusion.  Cardiovascular: Negative for chest pain and leg swelling.  Gastrointestinal: Negative for abdominal pain, constipation, diarrhea, nausea and vomiting.  Genitourinary: Negative for bladder incontinence, difficulty urinating, dysuria, frequency and hematuria.   Musculoskeletal: Negative for back pain, gait problem, neck pain and neck stiffness.  Skin:Negative for rash and ulcers Neurological: Negative for dizziness, extremity weakness,  gait problem, headaches, light-headedness and seizures.  Hematological: Negative for adenopathy. Does not bruise/bleed easily.  Psychiatric/Behavioral: Negative for confusion, depression and sleep disturbance. The patient is not nervous/anxious.    PHYSICAL EXAMINATION  ECOG PERFORMANCE STATUS: 1 - Symptomatic but completely ambulatory  Vitals:   04/21/22 0902  BP: 127/64  Pulse: 99  Resp: 16  Temp: 97.7 F (36.5 C)  SpO2: 100%    Physical Exam Constitutional:      Appearance: Normal appearance.  Cardiovascular:     Rate and Rhythm: Normal rate and regular rhythm.     Pulses: Normal pulses.     Heart sounds: Normal heart sounds.  Pulmonary:     Effort: Pulmonary effort is normal.      Breath sounds: Normal breath sounds.  Abdominal:     General: Abdomen is flat. Bowel sounds are normal.  Musculoskeletal:     Cervical back: Normal range of motion. No rigidity.  Lymphadenopathy:     Cervical: No cervical adenopathy.  Skin:    Coloration: Skin is not jaundiced.  Neurological:     General: No focal deficit present.     Mental Status: She is alert.    LABORATORY DATA:  CBC    Component Value Date/Time   WBC 4.7 03/24/2022 1248   WBC 9.3 12/20/2021 0900   RBC 4.12 03/24/2022 1248   HGB 11.7 (L) 03/24/2022 1248   HCT 35.5 (L) 03/24/2022 1248   PLT 295 03/24/2022 1248   MCV 86.2 03/24/2022 1248   MCH 28.4 03/24/2022 1248   MCHC 33.0 03/24/2022 1248   RDW 13.7 03/24/2022 1248   LYMPHSABS 0.7 03/24/2022 1248   MONOABS 0.3 03/24/2022 1248   EOSABS 0.1 03/24/2022 1248   BASOSABS 0.0 03/24/2022 1248    CMP     Component Value Date/Time   NA 138 03/24/2022 1248   K 3.7 03/24/2022 1248   CL 104 03/24/2022 1248   CO2 28 03/24/2022 1248   GLUCOSE 89 03/24/2022 1248   BUN 15 03/24/2022 1248   CREATININE 0.62 03/24/2022 1248   CALCIUM 9.3 03/24/2022 1248   PROT 6.9 03/24/2022 1248   ALBUMIN 3.9 03/24/2022 1248   AST 14 (L) 03/24/2022 1248   ALT 14 03/24/2022 1248   ALKPHOS 52 03/24/2022 1248   BILITOT 0.3 03/24/2022 1248   GFRNONAA >60 03/24/2022 1248    ASSESSMENT and PLAN:  Hannah Murray is a 47 y.o. female who returns for a follow up for right breast cancer.   #Right breast intraductal carcinoma: --ER/PR positive, HER2 negative, clinically stage T2 N0 M0 grade 2/3  --Underwent right mastectomy on 09/30/2021 which showed grade 3 invasive ductal carcinoma measuring 4.3 cm in greatest dimension, margins negative along with solid type DCIS high-grade. Right axillary lymph node evaluation showed 1 out of 8 lymph nodes positive for malignancy.  --Oncotype test resulted at 24, benefit of chemotherapy cannot be excluded.  --Recommendation was adjuvant dose  dense adriamycin and cytoxan q 14 days x 4 cycles followed by weekly taxol q 7 days x 12 cycles. --Patient completed adjuvant AC -T last cycle on 01/06/2022 -- She will now move forward with adjuvant radiation. --Following radiation, she will start her on ovarian suppression with anastrozole along with abemaciclib given lymph node metastasis. -- We have discussed about the mechanism of action of anastrozole as well as abemaciclib and role of ovarian suppression.  She is willing to do everything and anything that is recommended.  She continues to stay active, runs for an  hour every day, lifts weights. She will return to clinic in about 8 weeks for follow-up and will initiate antiestrogen therapy around that time.  No problem-specific Assessment & Plan notes found for this encounter.   All questions were answered. The patient knows to call the clinic with any problems, questions or concerns. We can certainly see the patient much sooner if necessary.  I have spent a total of 30 minutes minutes of face-to-face and non-face-to-face time, preparing to see the patient, performing a medically appropriate examination, counseling and educating the patient, documenting clinical information in the electronic health record,  and care coordination.

## 2022-04-28 ENCOUNTER — Encounter: Payer: Self-pay | Admitting: *Deleted

## 2022-04-30 DIAGNOSIS — C50411 Malignant neoplasm of upper-outer quadrant of right female breast: Secondary | ICD-10-CM | POA: Insufficient documentation

## 2022-04-30 DIAGNOSIS — Z510 Encounter for antineoplastic radiation therapy: Secondary | ICD-10-CM | POA: Insufficient documentation

## 2022-05-01 ENCOUNTER — Ambulatory Visit
Admission: RE | Admit: 2022-05-01 | Discharge: 2022-05-01 | Disposition: A | Discharge status: Home / Self Care | Payer: Self-pay | Source: Ambulatory Visit | Attending: Radiation Oncology | Admitting: Radiation Oncology

## 2022-05-01 ENCOUNTER — Other Ambulatory Visit: Payer: Self-pay

## 2022-05-01 LAB — RAD ONC ARIA SESSION SUMMARY

## 2022-05-02 ENCOUNTER — Other Ambulatory Visit: Payer: Self-pay

## 2022-05-02 ENCOUNTER — Ambulatory Visit
Admission: RE | Admit: 2022-05-02 | Discharge: 2022-05-02 | Disposition: A | Discharge status: Home / Self Care | Payer: Self-pay | Source: Ambulatory Visit | Attending: Radiation Oncology | Admitting: Radiation Oncology

## 2022-05-02 DIAGNOSIS — C50411 Malignant neoplasm of upper-outer quadrant of right female breast: Secondary | ICD-10-CM

## 2022-05-02 LAB — RAD ONC ARIA SESSION SUMMARY

## 2022-05-02 MED ORDER — ALRA NON-METALLIC DEODORANT (RAD-ONC)
1.0000 | Freq: Once | TOPICAL | Status: AC
  Administered 2022-05-02: 1 via TOPICAL

## 2022-05-02 MED ORDER — RADIAPLEXRX EX GEL: Freq: Once | CUTANEOUS | Status: AC

## 2022-05-05 ENCOUNTER — Other Ambulatory Visit: Payer: Self-pay

## 2022-05-05 ENCOUNTER — Ambulatory Visit
Admission: RE | Admit: 2022-05-05 | Discharge: 2022-05-05 | Disposition: A | Discharge status: Home / Self Care | Payer: Self-pay | Source: Ambulatory Visit | Attending: Radiation Oncology | Admitting: Radiation Oncology

## 2022-05-05 LAB — RAD ONC ARIA SESSION SUMMARY
Course Elapsed Days: 4
Plan Fractions Treated to Date: 2
Plan Fractions Treated to Date: 3
Plan Prescribed Dose Per Fraction: 1.8 Gy
Plan Prescribed Dose Per Fraction: 1.8 Gy
Plan Total Fractions Prescribed: 14
Plan Total Fractions Prescribed: 28
Plan Total Prescribed Dose: 25.2 Gy
Plan Total Prescribed Dose: 50.4 Gy
Reference Point Dosage Given to Date: 3.6 Gy
Reference Point Dosage Given to Date: 5.4 Gy
Reference Point Session Dosage Given: 1.8 Gy
Reference Point Session Dosage Given: 1.8 Gy
Session Number: 3

## 2022-05-06 ENCOUNTER — Other Ambulatory Visit: Payer: Self-pay

## 2022-05-06 ENCOUNTER — Ambulatory Visit
Admission: RE | Admit: 2022-05-06 | Discharge: 2022-05-06 | Disposition: A | Discharge status: Home / Self Care | Payer: Self-pay | Source: Ambulatory Visit | Attending: Radiation Oncology | Admitting: Radiation Oncology

## 2022-05-06 ENCOUNTER — Telehealth: Payer: Self-pay | Admitting: Radiation Oncology

## 2022-05-06 LAB — RAD ONC ARIA SESSION SUMMARY
Course Elapsed Days: 5
Plan Fractions Treated to Date: 2
Plan Fractions Treated to Date: 4
Plan Prescribed Dose Per Fraction: 1.8 Gy
Plan Prescribed Dose Per Fraction: 1.8 Gy
Plan Total Fractions Prescribed: 14
Plan Total Fractions Prescribed: 28
Plan Total Prescribed Dose: 25.2 Gy
Plan Total Prescribed Dose: 50.4 Gy
Reference Point Dosage Given to Date: 3.6 Gy
Reference Point Dosage Given to Date: 7.2 Gy
Reference Point Session Dosage Given: 1.8 Gy
Reference Point Session Dosage Given: 1.8 Gy
Session Number: 4

## 2022-05-07 ENCOUNTER — Ambulatory Visit
Admission: RE | Admit: 2022-05-07 | Discharge: 2022-05-07 | Disposition: A | Discharge status: Home / Self Care | Payer: Self-pay | Source: Ambulatory Visit | Attending: Radiation Oncology | Admitting: Radiation Oncology

## 2022-05-07 ENCOUNTER — Other Ambulatory Visit: Payer: Self-pay

## 2022-05-07 LAB — RAD ONC ARIA SESSION SUMMARY
Course Elapsed Days: 6
Plan Fractions Treated to Date: 3
Plan Fractions Treated to Date: 5
Plan Prescribed Dose Per Fraction: 1.8 Gy
Plan Prescribed Dose Per Fraction: 1.8 Gy
Plan Total Fractions Prescribed: 14
Plan Total Fractions Prescribed: 28
Plan Total Prescribed Dose: 25.2 Gy
Plan Total Prescribed Dose: 50.4 Gy
Reference Point Dosage Given to Date: 5.4 Gy
Reference Point Dosage Given to Date: 9 Gy
Reference Point Session Dosage Given: 1.8 Gy
Reference Point Session Dosage Given: 1.8 Gy
Session Number: 5

## 2022-05-08 ENCOUNTER — Other Ambulatory Visit: Payer: Self-pay

## 2022-05-08 ENCOUNTER — Ambulatory Visit
Admission: RE | Admit: 2022-05-08 | Discharge: 2022-05-08 | Disposition: A | Discharge status: Home / Self Care | Payer: Self-pay | Source: Ambulatory Visit | Attending: Radiation Oncology | Admitting: Radiation Oncology

## 2022-05-08 LAB — RAD ONC ARIA SESSION SUMMARY
Course Elapsed Days: 7
Plan Fractions Treated to Date: 3
Plan Fractions Treated to Date: 6
Plan Prescribed Dose Per Fraction: 1.8 Gy
Plan Prescribed Dose Per Fraction: 1.8 Gy
Plan Total Fractions Prescribed: 14
Plan Total Fractions Prescribed: 28
Plan Total Prescribed Dose: 25.2 Gy
Plan Total Prescribed Dose: 50.4 Gy
Reference Point Dosage Given to Date: 10.8 Gy
Reference Point Dosage Given to Date: 5.4 Gy
Reference Point Session Dosage Given: 1.8 Gy
Reference Point Session Dosage Given: 1.8 Gy
Session Number: 6

## 2022-05-09 ENCOUNTER — Other Ambulatory Visit: Payer: Self-pay

## 2022-05-09 ENCOUNTER — Ambulatory Visit
Admission: RE | Admit: 2022-05-09 | Discharge: 2022-05-09 | Disposition: A | Discharge status: Home / Self Care | Payer: Self-pay | Source: Ambulatory Visit | Attending: Radiation Oncology | Admitting: Radiation Oncology

## 2022-05-09 LAB — RAD ONC ARIA SESSION SUMMARY
Course Elapsed Days: 8
Plan Fractions Treated to Date: 4
Plan Fractions Treated to Date: 7
Plan Prescribed Dose Per Fraction: 1.8 Gy
Plan Prescribed Dose Per Fraction: 1.8 Gy
Plan Total Fractions Prescribed: 14
Plan Total Fractions Prescribed: 28
Plan Total Prescribed Dose: 25.2 Gy
Plan Total Prescribed Dose: 50.4 Gy
Reference Point Dosage Given to Date: 12.6 Gy
Reference Point Dosage Given to Date: 7.2 Gy
Reference Point Session Dosage Given: 1.8 Gy
Reference Point Session Dosage Given: 1.8 Gy
Session Number: 7

## 2022-05-12 ENCOUNTER — Ambulatory Visit
Admission: RE | Admit: 2022-05-12 | Discharge: 2022-05-12 | Disposition: A | Discharge status: Home / Self Care | Payer: Self-pay | Source: Ambulatory Visit | Attending: Radiation Oncology | Admitting: Radiation Oncology

## 2022-05-12 ENCOUNTER — Other Ambulatory Visit: Payer: Self-pay

## 2022-05-12 LAB — RAD ONC ARIA SESSION SUMMARY
Course Elapsed Days: 11
Plan Fractions Treated to Date: 4
Plan Fractions Treated to Date: 8
Plan Prescribed Dose Per Fraction: 1.8 Gy
Plan Prescribed Dose Per Fraction: 1.8 Gy
Plan Total Fractions Prescribed: 14
Plan Total Fractions Prescribed: 28
Plan Total Prescribed Dose: 25.2 Gy
Plan Total Prescribed Dose: 50.4 Gy
Reference Point Dosage Given to Date: 14.4 Gy
Reference Point Dosage Given to Date: 7.2 Gy
Reference Point Session Dosage Given: 1.8 Gy
Reference Point Session Dosage Given: 1.8 Gy
Session Number: 8

## 2022-05-13 ENCOUNTER — Other Ambulatory Visit: Payer: Self-pay

## 2022-05-13 ENCOUNTER — Ambulatory Visit
Admission: RE | Admit: 2022-05-13 | Discharge: 2022-05-13 | Disposition: A | Discharge status: Home / Self Care | Payer: Self-pay | Source: Ambulatory Visit | Attending: Radiation Oncology | Admitting: Radiation Oncology

## 2022-05-13 LAB — RAD ONC ARIA SESSION SUMMARY
Course Elapsed Days: 12
Plan Fractions Treated to Date: 5
Plan Fractions Treated to Date: 9
Plan Prescribed Dose Per Fraction: 1.8 Gy
Plan Prescribed Dose Per Fraction: 1.8 Gy
Plan Total Fractions Prescribed: 14
Plan Total Fractions Prescribed: 28
Plan Total Prescribed Dose: 25.2 Gy
Plan Total Prescribed Dose: 50.4 Gy
Reference Point Dosage Given to Date: 16.2 Gy
Reference Point Dosage Given to Date: 9 Gy
Reference Point Session Dosage Given: 1.8 Gy
Reference Point Session Dosage Given: 1.8 Gy
Session Number: 9

## 2022-05-14 ENCOUNTER — Ambulatory Visit
Admission: RE | Admit: 2022-05-14 | Discharge: 2022-05-14 | Disposition: A | Discharge status: Home / Self Care | Payer: Self-pay | Source: Ambulatory Visit | Attending: Radiation Oncology | Admitting: Radiation Oncology

## 2022-05-14 ENCOUNTER — Other Ambulatory Visit: Payer: Self-pay

## 2022-05-14 LAB — RAD ONC ARIA SESSION SUMMARY
Course Elapsed Days: 13
Plan Fractions Treated to Date: 10
Plan Fractions Treated to Date: 5
Plan Prescribed Dose Per Fraction: 1.8 Gy
Plan Prescribed Dose Per Fraction: 1.8 Gy
Plan Total Fractions Prescribed: 14
Plan Total Fractions Prescribed: 28
Plan Total Prescribed Dose: 25.2 Gy
Plan Total Prescribed Dose: 50.4 Gy
Reference Point Dosage Given to Date: 18 Gy
Reference Point Dosage Given to Date: 9 Gy
Reference Point Session Dosage Given: 1.8 Gy
Reference Point Session Dosage Given: 1.8 Gy
Session Number: 10

## 2022-05-15 ENCOUNTER — Other Ambulatory Visit: Payer: Self-pay

## 2022-05-15 ENCOUNTER — Ambulatory Visit
Admission: RE | Admit: 2022-05-15 | Discharge: 2022-05-15 | Disposition: A | Discharge status: Home / Self Care | Payer: Self-pay | Source: Ambulatory Visit | Attending: Radiation Oncology | Admitting: Radiation Oncology

## 2022-05-15 LAB — RAD ONC ARIA SESSION SUMMARY
Course Elapsed Days: 14
Plan Fractions Treated to Date: 11
Plan Fractions Treated to Date: 6
Plan Prescribed Dose Per Fraction: 1.8 Gy
Plan Prescribed Dose Per Fraction: 1.8 Gy
Plan Total Fractions Prescribed: 14
Plan Total Fractions Prescribed: 28
Plan Total Prescribed Dose: 25.2 Gy
Plan Total Prescribed Dose: 50.4 Gy
Reference Point Dosage Given to Date: 10.8 Gy
Reference Point Dosage Given to Date: 19.8 Gy
Reference Point Session Dosage Given: 1.8 Gy
Reference Point Session Dosage Given: 1.8 Gy
Session Number: 11

## 2022-05-16 ENCOUNTER — Ambulatory Visit: Payer: Self-pay

## 2022-05-16 ENCOUNTER — Other Ambulatory Visit: Payer: Self-pay

## 2022-05-16 ENCOUNTER — Ambulatory Visit
Admission: RE | Admit: 2022-05-16 | Discharge: 2022-05-16 | Disposition: A | Discharge status: Home / Self Care | Payer: Self-pay | Source: Ambulatory Visit | Attending: Radiation Oncology | Admitting: Radiation Oncology

## 2022-05-16 LAB — RAD ONC ARIA SESSION SUMMARY
Course Elapsed Days: 15
Plan Fractions Treated to Date: 12
Plan Fractions Treated to Date: 6
Plan Prescribed Dose Per Fraction: 1.8 Gy
Plan Prescribed Dose Per Fraction: 1.8 Gy
Plan Total Fractions Prescribed: 14
Plan Total Fractions Prescribed: 28
Plan Total Prescribed Dose: 25.2 Gy
Plan Total Prescribed Dose: 50.4 Gy
Reference Point Dosage Given to Date: 10.8 Gy
Reference Point Dosage Given to Date: 21.6 Gy
Reference Point Session Dosage Given: 1.8 Gy
Reference Point Session Dosage Given: 1.8 Gy
Session Number: 12

## 2022-05-19 ENCOUNTER — Ambulatory Visit
Admission: RE | Admit: 2022-05-19 | Discharge: 2022-05-19 | Disposition: A | Discharge status: Home / Self Care | Payer: Self-pay | Source: Ambulatory Visit | Attending: Radiation Oncology | Admitting: Radiation Oncology

## 2022-05-19 ENCOUNTER — Other Ambulatory Visit: Payer: Self-pay

## 2022-05-19 LAB — RAD ONC ARIA SESSION SUMMARY
Course Elapsed Days: 18
Plan Fractions Treated to Date: 13
Plan Fractions Treated to Date: 7
Plan Prescribed Dose Per Fraction: 1.8 Gy
Plan Prescribed Dose Per Fraction: 1.8 Gy
Plan Total Fractions Prescribed: 14
Plan Total Fractions Prescribed: 28
Plan Total Prescribed Dose: 25.2 Gy
Plan Total Prescribed Dose: 50.4 Gy
Reference Point Dosage Given to Date: 12.6 Gy
Reference Point Dosage Given to Date: 23.4 Gy
Reference Point Session Dosage Given: 1.8 Gy
Reference Point Session Dosage Given: 1.8 Gy
Session Number: 13

## 2022-05-20 ENCOUNTER — Ambulatory Visit
Admission: RE | Admit: 2022-05-20 | Discharge: 2022-05-20 | Disposition: A | Discharge status: Home / Self Care | Payer: Self-pay | Source: Ambulatory Visit | Attending: Radiation Oncology | Admitting: Radiation Oncology

## 2022-05-20 ENCOUNTER — Other Ambulatory Visit: Payer: Self-pay

## 2022-05-20 LAB — RAD ONC ARIA SESSION SUMMARY
Course Elapsed Days: 19
Plan Fractions Treated to Date: 14
Plan Fractions Treated to Date: 7
Plan Prescribed Dose Per Fraction: 1.8 Gy
Plan Prescribed Dose Per Fraction: 1.8 Gy
Plan Total Fractions Prescribed: 14
Plan Total Fractions Prescribed: 28
Plan Total Prescribed Dose: 25.2 Gy
Plan Total Prescribed Dose: 50.4 Gy
Reference Point Dosage Given to Date: 12.6 Gy
Reference Point Dosage Given to Date: 25.2 Gy
Reference Point Session Dosage Given: 1.8 Gy
Reference Point Session Dosage Given: 1.8 Gy
Session Number: 14

## 2022-05-21 ENCOUNTER — Ambulatory Visit
Admission: RE | Admit: 2022-05-21 | Discharge: 2022-05-21 | Disposition: A | Discharge status: Home / Self Care | Payer: Self-pay | Source: Ambulatory Visit | Attending: Radiation Oncology | Admitting: Radiation Oncology

## 2022-05-21 ENCOUNTER — Other Ambulatory Visit: Payer: Self-pay

## 2022-05-21 LAB — RAD ONC ARIA SESSION SUMMARY
Course Elapsed Days: 20
Plan Fractions Treated to Date: 15
Plan Fractions Treated to Date: 8
Plan Prescribed Dose Per Fraction: 1.8 Gy
Plan Prescribed Dose Per Fraction: 1.8 Gy
Plan Total Fractions Prescribed: 14
Plan Total Fractions Prescribed: 28
Plan Total Prescribed Dose: 25.2 Gy
Plan Total Prescribed Dose: 50.4 Gy
Reference Point Dosage Given to Date: 14.4 Gy
Reference Point Dosage Given to Date: 27 Gy
Reference Point Session Dosage Given: 1.8 Gy
Reference Point Session Dosage Given: 1.8 Gy
Session Number: 15

## 2022-05-22 ENCOUNTER — Other Ambulatory Visit: Payer: Self-pay

## 2022-05-22 ENCOUNTER — Ambulatory Visit
Admission: RE | Admit: 2022-05-22 | Discharge: 2022-05-22 | Disposition: A | Discharge status: Home / Self Care | Payer: Self-pay | Source: Ambulatory Visit | Attending: Radiation Oncology | Admitting: Radiation Oncology

## 2022-05-22 LAB — RAD ONC ARIA SESSION SUMMARY
Course Elapsed Days: 21
Plan Fractions Treated to Date: 16
Plan Fractions Treated to Date: 8
Plan Prescribed Dose Per Fraction: 1.8 Gy
Plan Prescribed Dose Per Fraction: 1.8 Gy
Plan Total Fractions Prescribed: 14
Plan Total Fractions Prescribed: 28
Plan Total Prescribed Dose: 25.2 Gy
Plan Total Prescribed Dose: 50.4 Gy
Reference Point Dosage Given to Date: 14.4 Gy
Reference Point Dosage Given to Date: 28.8 Gy
Reference Point Session Dosage Given: 1.8 Gy
Reference Point Session Dosage Given: 1.8 Gy
Session Number: 16

## 2022-05-23 ENCOUNTER — Other Ambulatory Visit: Payer: Self-pay

## 2022-05-23 ENCOUNTER — Ambulatory Visit
Admission: RE | Admit: 2022-05-23 | Discharge: 2022-05-23 | Disposition: A | Discharge status: Home / Self Care | Payer: Self-pay | Source: Ambulatory Visit | Attending: Radiation Oncology | Admitting: Radiation Oncology

## 2022-05-23 LAB — RAD ONC ARIA SESSION SUMMARY
Course Elapsed Days: 22
Plan Fractions Treated to Date: 17
Plan Fractions Treated to Date: 9
Plan Prescribed Dose Per Fraction: 1.8 Gy
Plan Prescribed Dose Per Fraction: 1.8 Gy
Plan Total Fractions Prescribed: 14
Plan Total Fractions Prescribed: 28
Plan Total Prescribed Dose: 25.2 Gy
Plan Total Prescribed Dose: 50.4 Gy
Reference Point Dosage Given to Date: 16.2 Gy
Reference Point Dosage Given to Date: 30.6 Gy
Reference Point Session Dosage Given: 1.8 Gy
Reference Point Session Dosage Given: 1.8 Gy
Session Number: 17

## 2022-05-26 ENCOUNTER — Ambulatory Visit
Admission: RE | Admit: 2022-05-26 | Discharge: 2022-05-26 | Disposition: A | Discharge status: Home / Self Care | Payer: Self-pay | Source: Ambulatory Visit | Attending: Radiation Oncology | Admitting: Radiation Oncology

## 2022-05-26 ENCOUNTER — Other Ambulatory Visit: Payer: Self-pay

## 2022-05-26 DIAGNOSIS — Z510 Encounter for antineoplastic radiation therapy: Secondary | ICD-10-CM | POA: Insufficient documentation

## 2022-05-26 DIAGNOSIS — C50411 Malignant neoplasm of upper-outer quadrant of right female breast: Secondary | ICD-10-CM | POA: Insufficient documentation

## 2022-05-26 LAB — RAD ONC ARIA SESSION SUMMARY
Course Elapsed Days: 25
Plan Fractions Treated to Date: 18
Plan Fractions Treated to Date: 9
Plan Prescribed Dose Per Fraction: 1.8 Gy
Plan Prescribed Dose Per Fraction: 1.8 Gy
Plan Total Fractions Prescribed: 14
Plan Total Fractions Prescribed: 28
Plan Total Prescribed Dose: 25.2 Gy
Plan Total Prescribed Dose: 50.4 Gy
Reference Point Dosage Given to Date: 16.2 Gy
Reference Point Dosage Given to Date: 32.4 Gy
Reference Point Session Dosage Given: 1.8 Gy
Reference Point Session Dosage Given: 1.8 Gy
Session Number: 18

## 2022-05-27 ENCOUNTER — Other Ambulatory Visit: Payer: Self-pay

## 2022-05-27 ENCOUNTER — Ambulatory Visit
Admission: RE | Admit: 2022-05-27 | Discharge: 2022-05-27 | Disposition: A | Discharge status: Home / Self Care | Payer: Self-pay | Source: Ambulatory Visit | Attending: Radiation Oncology | Admitting: Radiation Oncology

## 2022-05-27 LAB — RAD ONC ARIA SESSION SUMMARY
Course Elapsed Days: 26
Plan Fractions Treated to Date: 10
Plan Fractions Treated to Date: 19
Plan Prescribed Dose Per Fraction: 1.8 Gy
Plan Prescribed Dose Per Fraction: 1.8 Gy
Plan Total Fractions Prescribed: 14
Plan Total Fractions Prescribed: 28
Plan Total Prescribed Dose: 25.2 Gy
Plan Total Prescribed Dose: 50.4 Gy
Reference Point Dosage Given to Date: 18 Gy
Reference Point Dosage Given to Date: 34.2 Gy
Reference Point Session Dosage Given: 1.8 Gy
Reference Point Session Dosage Given: 1.8 Gy
Session Number: 19

## 2022-05-28 ENCOUNTER — Ambulatory Visit
Admission: RE | Admit: 2022-05-28 | Discharge: 2022-05-28 | Disposition: A | Discharge status: Home / Self Care | Payer: Self-pay | Source: Ambulatory Visit | Attending: Radiation Oncology | Admitting: Radiation Oncology

## 2022-05-28 ENCOUNTER — Other Ambulatory Visit: Payer: Self-pay

## 2022-05-28 LAB — RAD ONC ARIA SESSION SUMMARY
Course Elapsed Days: 27
Plan Fractions Treated to Date: 10
Plan Fractions Treated to Date: 20
Plan Prescribed Dose Per Fraction: 1.8 Gy
Plan Prescribed Dose Per Fraction: 1.8 Gy
Plan Total Fractions Prescribed: 14
Plan Total Fractions Prescribed: 28
Plan Total Prescribed Dose: 25.2 Gy
Plan Total Prescribed Dose: 50.4 Gy
Reference Point Dosage Given to Date: 18 Gy
Reference Point Dosage Given to Date: 36 Gy
Reference Point Session Dosage Given: 1.8 Gy
Reference Point Session Dosage Given: 1.8 Gy
Session Number: 20

## 2022-05-29 ENCOUNTER — Other Ambulatory Visit: Payer: Self-pay

## 2022-05-29 ENCOUNTER — Ambulatory Visit
Admission: RE | Admit: 2022-05-29 | Discharge: 2022-05-29 | Disposition: A | Discharge status: Home / Self Care | Payer: Self-pay | Source: Ambulatory Visit | Attending: Radiation Oncology | Admitting: Radiation Oncology

## 2022-05-29 LAB — RAD ONC ARIA SESSION SUMMARY
Course Elapsed Days: 28
Plan Fractions Treated to Date: 11
Plan Fractions Treated to Date: 21
Plan Prescribed Dose Per Fraction: 1.8 Gy
Plan Prescribed Dose Per Fraction: 1.8 Gy
Plan Total Fractions Prescribed: 14
Plan Total Fractions Prescribed: 28
Plan Total Prescribed Dose: 25.2 Gy
Plan Total Prescribed Dose: 50.4 Gy
Reference Point Dosage Given to Date: 19.8 Gy
Reference Point Dosage Given to Date: 37.8 Gy
Reference Point Session Dosage Given: 1.8 Gy
Reference Point Session Dosage Given: 1.8 Gy
Session Number: 21

## 2022-05-30 ENCOUNTER — Other Ambulatory Visit: Payer: Self-pay

## 2022-05-30 ENCOUNTER — Ambulatory Visit
Admission: RE | Admit: 2022-05-30 | Discharge: 2022-05-30 | Disposition: A | Discharge status: Home / Self Care | Payer: Self-pay | Source: Ambulatory Visit | Attending: Radiation Oncology | Admitting: Radiation Oncology

## 2022-05-30 LAB — RAD ONC ARIA SESSION SUMMARY
Course Elapsed Days: 29
Plan Fractions Treated to Date: 11
Plan Fractions Treated to Date: 22
Plan Prescribed Dose Per Fraction: 1.8 Gy
Plan Prescribed Dose Per Fraction: 1.8 Gy
Plan Total Fractions Prescribed: 14
Plan Total Fractions Prescribed: 28
Plan Total Prescribed Dose: 25.2 Gy
Plan Total Prescribed Dose: 50.4 Gy
Reference Point Dosage Given to Date: 19.8 Gy
Reference Point Dosage Given to Date: 39.6 Gy
Reference Point Session Dosage Given: 1.8 Gy
Reference Point Session Dosage Given: 1.8 Gy
Session Number: 22

## 2022-06-02 ENCOUNTER — Ambulatory Visit
Admission: RE | Admit: 2022-06-02 | Discharge: 2022-06-02 | Disposition: A | Discharge status: Home / Self Care | Payer: Self-pay | Source: Ambulatory Visit | Attending: Radiation Oncology | Admitting: Radiation Oncology

## 2022-06-02 ENCOUNTER — Other Ambulatory Visit: Payer: Self-pay

## 2022-06-02 LAB — RAD ONC ARIA SESSION SUMMARY
Course Elapsed Days: 32
Plan Fractions Treated to Date: 12
Plan Fractions Treated to Date: 23
Plan Prescribed Dose Per Fraction: 1.8 Gy
Plan Prescribed Dose Per Fraction: 1.8 Gy
Plan Total Fractions Prescribed: 14
Plan Total Fractions Prescribed: 28
Plan Total Prescribed Dose: 25.2 Gy
Plan Total Prescribed Dose: 50.4 Gy
Reference Point Dosage Given to Date: 21.6 Gy
Reference Point Dosage Given to Date: 41.4 Gy
Reference Point Session Dosage Given: 1.8 Gy
Reference Point Session Dosage Given: 1.8 Gy
Session Number: 23

## 2022-06-03 ENCOUNTER — Ambulatory Visit
Admission: RE | Admit: 2022-06-03 | Discharge: 2022-06-03 | Disposition: A | Discharge status: Home / Self Care | Payer: Self-pay | Source: Ambulatory Visit | Attending: Radiation Oncology | Admitting: Radiation Oncology

## 2022-06-03 ENCOUNTER — Other Ambulatory Visit: Payer: Self-pay

## 2022-06-03 LAB — RAD ONC ARIA SESSION SUMMARY
Course Elapsed Days: 33
Plan Fractions Treated to Date: 12
Plan Fractions Treated to Date: 24
Plan Prescribed Dose Per Fraction: 1.8 Gy
Plan Prescribed Dose Per Fraction: 1.8 Gy
Plan Total Fractions Prescribed: 14
Plan Total Fractions Prescribed: 28
Plan Total Prescribed Dose: 25.2 Gy
Plan Total Prescribed Dose: 50.4 Gy
Reference Point Dosage Given to Date: 21.6 Gy
Reference Point Dosage Given to Date: 43.2 Gy
Reference Point Session Dosage Given: 1.8 Gy
Reference Point Session Dosage Given: 1.8 Gy
Session Number: 24

## 2022-06-04 ENCOUNTER — Ambulatory Visit
Admission: RE | Admit: 2022-06-04 | Discharge: 2022-06-04 | Disposition: A | Discharge status: Home / Self Care | Payer: Self-pay | Source: Ambulatory Visit | Attending: Radiation Oncology | Admitting: Radiation Oncology

## 2022-06-04 ENCOUNTER — Other Ambulatory Visit: Payer: Self-pay

## 2022-06-04 LAB — RAD ONC ARIA SESSION SUMMARY
Course Elapsed Days: 34
Plan Fractions Treated to Date: 13
Plan Fractions Treated to Date: 25
Plan Prescribed Dose Per Fraction: 1.8 Gy
Plan Prescribed Dose Per Fraction: 1.8 Gy
Plan Total Fractions Prescribed: 14
Plan Total Fractions Prescribed: 28
Plan Total Prescribed Dose: 25.2 Gy
Plan Total Prescribed Dose: 50.4 Gy
Reference Point Dosage Given to Date: 23.4 Gy
Reference Point Dosage Given to Date: 45 Gy
Reference Point Session Dosage Given: 1.8 Gy
Reference Point Session Dosage Given: 1.8 Gy
Session Number: 25

## 2022-06-05 ENCOUNTER — Other Ambulatory Visit: Payer: Self-pay

## 2022-06-05 ENCOUNTER — Ambulatory Visit
Admission: RE | Admit: 2022-06-05 | Discharge: 2022-06-05 | Disposition: A | Discharge status: Home / Self Care | Payer: Self-pay | Source: Ambulatory Visit | Attending: Radiation Oncology | Admitting: Radiation Oncology

## 2022-06-05 LAB — RAD ONC ARIA SESSION SUMMARY
Course Elapsed Days: 35
Plan Fractions Treated to Date: 13
Plan Fractions Treated to Date: 26
Plan Prescribed Dose Per Fraction: 1.8 Gy
Plan Prescribed Dose Per Fraction: 1.8 Gy
Plan Total Fractions Prescribed: 14
Plan Total Fractions Prescribed: 28
Plan Total Prescribed Dose: 25.2 Gy
Plan Total Prescribed Dose: 50.4 Gy
Reference Point Dosage Given to Date: 23.4 Gy
Reference Point Dosage Given to Date: 46.8 Gy
Reference Point Session Dosage Given: 1.8 Gy
Reference Point Session Dosage Given: 1.8 Gy
Session Number: 26

## 2022-06-06 ENCOUNTER — Ambulatory Visit
Admission: RE | Admit: 2022-06-06 | Discharge: 2022-06-06 | Disposition: A | Discharge status: Home / Self Care | Payer: Self-pay | Source: Ambulatory Visit | Attending: Radiation Oncology | Admitting: Radiation Oncology

## 2022-06-06 ENCOUNTER — Other Ambulatory Visit: Payer: Self-pay

## 2022-06-06 LAB — RAD ONC ARIA SESSION SUMMARY
Course Elapsed Days: 36
Plan Fractions Treated to Date: 14
Plan Fractions Treated to Date: 27
Plan Prescribed Dose Per Fraction: 1.8 Gy
Plan Prescribed Dose Per Fraction: 1.8 Gy
Plan Total Fractions Prescribed: 14
Plan Total Fractions Prescribed: 28
Plan Total Prescribed Dose: 25.2 Gy
Plan Total Prescribed Dose: 50.4 Gy
Reference Point Dosage Given to Date: 25.2 Gy
Reference Point Dosage Given to Date: 48.6 Gy
Reference Point Session Dosage Given: 1.8 Gy
Reference Point Session Dosage Given: 1.8 Gy
Session Number: 27

## 2022-06-09 ENCOUNTER — Ambulatory Visit
Admission: RE | Admit: 2022-06-09 | Discharge: 2022-06-09 | Disposition: A | Discharge status: Home / Self Care | Payer: Self-pay | Source: Ambulatory Visit | Attending: Radiation Oncology | Admitting: Radiation Oncology

## 2022-06-09 ENCOUNTER — Other Ambulatory Visit: Payer: Self-pay

## 2022-06-09 LAB — RAD ONC ARIA SESSION SUMMARY
Course Elapsed Days: 39
Plan Fractions Treated to Date: 14
Plan Fractions Treated to Date: 28
Plan Prescribed Dose Per Fraction: 1.8 Gy
Plan Prescribed Dose Per Fraction: 1.8 Gy
Plan Total Fractions Prescribed: 14
Plan Total Fractions Prescribed: 28
Plan Total Prescribed Dose: 25.2 Gy
Plan Total Prescribed Dose: 50.4 Gy
Reference Point Dosage Given to Date: 25.2 Gy
Reference Point Dosage Given to Date: 50.4 Gy
Reference Point Session Dosage Given: 1.8 Gy
Reference Point Session Dosage Given: 1.8 Gy
Session Number: 28

## 2022-06-10 ENCOUNTER — Other Ambulatory Visit: Payer: Self-pay

## 2022-06-10 ENCOUNTER — Ambulatory Visit
Admission: RE | Admit: 2022-06-10 | Discharge: 2022-06-10 | Disposition: A | Discharge status: Home / Self Care | Payer: Self-pay | Source: Ambulatory Visit | Attending: Radiation Oncology | Admitting: Radiation Oncology

## 2022-06-10 LAB — RAD ONC ARIA SESSION SUMMARY
Course Elapsed Days: 40
Plan Fractions Treated to Date: 1
Plan Prescribed Dose Per Fraction: 2 Gy
Plan Total Fractions Prescribed: 5
Plan Total Prescribed Dose: 10 Gy
Reference Point Dosage Given to Date: 2 Gy
Reference Point Session Dosage Given: 2 Gy
Session Number: 29

## 2022-06-11 ENCOUNTER — Ambulatory Visit
Admission: RE | Admit: 2022-06-11 | Discharge: 2022-06-11 | Disposition: A | Discharge status: Home / Self Care | Payer: Self-pay | Source: Ambulatory Visit | Attending: Radiation Oncology | Admitting: Radiation Oncology

## 2022-06-11 ENCOUNTER — Other Ambulatory Visit: Payer: Self-pay

## 2022-06-11 LAB — RAD ONC ARIA SESSION SUMMARY
Course Elapsed Days: 41
Plan Fractions Treated to Date: 2
Plan Prescribed Dose Per Fraction: 2 Gy
Plan Total Fractions Prescribed: 5
Plan Total Prescribed Dose: 10 Gy
Reference Point Dosage Given to Date: 4 Gy
Reference Point Session Dosage Given: 2 Gy
Session Number: 30

## 2022-06-12 ENCOUNTER — Other Ambulatory Visit: Payer: Self-pay

## 2022-06-12 ENCOUNTER — Ambulatory Visit
Admission: RE | Admit: 2022-06-12 | Discharge: 2022-06-12 | Disposition: A | Discharge status: Home / Self Care | Payer: Self-pay | Source: Ambulatory Visit | Attending: Radiation Oncology | Admitting: Radiation Oncology

## 2022-06-12 LAB — RAD ONC ARIA SESSION SUMMARY
Course Elapsed Days: 42
Plan Fractions Treated to Date: 3
Plan Prescribed Dose Per Fraction: 2 Gy
Plan Total Fractions Prescribed: 5
Plan Total Prescribed Dose: 10 Gy
Reference Point Dosage Given to Date: 6 Gy
Reference Point Session Dosage Given: 2 Gy
Session Number: 31

## 2022-06-13 ENCOUNTER — Ambulatory Visit
Admission: RE | Admit: 2022-06-13 | Discharge: 2022-06-13 | Disposition: A | Discharge status: Home / Self Care | Payer: Self-pay | Source: Ambulatory Visit | Attending: Radiation Oncology | Admitting: Radiation Oncology

## 2022-06-13 ENCOUNTER — Other Ambulatory Visit: Payer: Self-pay

## 2022-06-13 LAB — RAD ONC ARIA SESSION SUMMARY
Course Elapsed Days: 43
Plan Fractions Treated to Date: 4
Plan Prescribed Dose Per Fraction: 2 Gy
Plan Total Fractions Prescribed: 5
Plan Total Prescribed Dose: 10 Gy
Reference Point Dosage Given to Date: 8 Gy
Reference Point Session Dosage Given: 2 Gy
Session Number: 32

## 2022-06-16 ENCOUNTER — Encounter: Payer: Self-pay | Admitting: Radiation Oncology

## 2022-06-16 ENCOUNTER — Other Ambulatory Visit: Payer: Self-pay

## 2022-06-16 ENCOUNTER — Inpatient Hospital Stay: Payer: Self-pay | Attending: Hematology and Oncology | Admitting: Hematology and Oncology

## 2022-06-16 ENCOUNTER — Ambulatory Visit
Admission: RE | Admit: 2022-06-16 | Discharge: 2022-06-16 | Disposition: A | Discharge status: Home / Self Care | Payer: Self-pay | Source: Ambulatory Visit | Attending: Radiation Oncology | Admitting: Radiation Oncology

## 2022-06-16 ENCOUNTER — Ambulatory Visit: Payer: Self-pay | Attending: Hematology and Oncology

## 2022-06-16 ENCOUNTER — Other Ambulatory Visit: Payer: Self-pay | Admitting: Hematology and Oncology

## 2022-06-16 ENCOUNTER — Telehealth: Payer: Self-pay | Admitting: *Deleted

## 2022-06-16 ENCOUNTER — Encounter: Payer: Self-pay | Admitting: *Deleted

## 2022-06-16 ENCOUNTER — Other Ambulatory Visit: Payer: Self-pay | Admitting: *Deleted

## 2022-06-16 VITALS — Wt 152.1 lb

## 2022-06-16 VITALS — BP 115/69 | HR 96 | Temp 97.9°F | Resp 16 | Ht 65.0 in | Wt 151.0 lb

## 2022-06-16 DIAGNOSIS — C773 Secondary and unspecified malignant neoplasm of axilla and upper limb lymph nodes: Secondary | ICD-10-CM | POA: Insufficient documentation

## 2022-06-16 DIAGNOSIS — Z170 Estrogen receptor positive status [ER+]: Secondary | ICD-10-CM | POA: Insufficient documentation

## 2022-06-16 DIAGNOSIS — Z7689 Persons encountering health services in other specified circumstances: Secondary | ICD-10-CM

## 2022-06-16 DIAGNOSIS — C50411 Malignant neoplasm of upper-outer quadrant of right female breast: Secondary | ICD-10-CM | POA: Insufficient documentation

## 2022-06-16 DIAGNOSIS — Z483 Aftercare following surgery for neoplasm: Secondary | ICD-10-CM | POA: Insufficient documentation

## 2022-06-16 LAB — RAD ONC ARIA SESSION SUMMARY
Course Elapsed Days: 46
Plan Fractions Treated to Date: 5
Plan Prescribed Dose Per Fraction: 2 Gy
Plan Total Fractions Prescribed: 5
Plan Total Prescribed Dose: 10 Gy
Reference Point Dosage Given to Date: 10 Gy
Reference Point Session Dosage Given: 2 Gy
Session Number: 33

## 2022-06-16 MED ORDER — ANASTROZOLE 1 MG PO TABS: 1.0000 mg | ORAL_TABLET | Freq: Every day | ORAL | 3 refills | Status: DC

## 2022-06-16 NOTE — Telephone Encounter (Signed)
Opened in error

## 2022-06-16 NOTE — Progress Notes (Signed)
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 Indeterminate mass in the right breast at 12:30, which may represent a fibroadenoma. No evidence of right axillary adenopathy.   05/16/2021 Pathology Results   Right breast 10 0 clock mass biopsy showed invasive mammary carcinoma, ductal phenotype prognostics include ER 95% strong staining intensity PR 95% strong staining intensity Ki-67 of 40% and HER2 negative   06/11/2021 Cancer Staging   Staging form: Breast, AJCC 8th Edition - Pathologic: Stage IIA (pT2, pN1a, cM0, G3, ER+, PR+, HER2-, Oncotype DX score: 24) - Signed by Hannah Moulds, MD on 11/04/2021 Multigene prognostic tests performed: Oncotype DX Recurrence score range: Greater than or equal to 11 Histologic grading system: 3 grade system   07/10/2021 Genetic Testing   Negative hereditary cancer genetic testing: no pathogenic variants detected [facility] Breast STAT Panel or Common Hereditary Cancers +RNA Panel.  Report dates are Jul 10, 2021 and Jul 18, 2021.   The STAT Breast cancer panel offered by [facility] includes sequencing and rearrangement analysis for the following 9 genes:  ATM, BRCA1, BRCA2, CDH1, CHEK2, PALB2, PTEN, STK11 and TP53.   The Common Hereditary  Cancers + RNA Panel offered by [facility] includes sequencing, deletion/duplication, and RNA testing of the following 47 genes: APC, ATM, AXIN2, BARD1, BMPR1A, BRCA1, BRCA2, BRIP1, CDH1, CDK4*, CDKN2A (p14ARF)*, CDKN2A (p16INK4a)*, CHEK2, CTNNA1, DICER1, EPCAM (Deletion/duplication testing only), GREM1 (promoter region deletion/duplication testing only), KIT, MEN1, MLH1, MSH2, MSH3, MSH6, MUTYH, NBN, NF1, NHTL1, PALB2, PDGFRA*, PMS2, POLD1, POLE, PTEN, RAD50, RAD51C, RAD51D, SDHB, SDHC, SDHD, SMAD4, SMARCA4. STK11, TP53, TSC1, TSC2, and VHL.  The following genes were evaluated for sequence changes only: SDHA and HOXB13 c.251G>A variant only.  RNA analysis is not performed for the * genes.     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  Right axillary lymph node evaluation showed 1 out of 8 lymph nodes positive for malignancy., final pathologic staging T2N1A.    11/11/2021 - 03/24/2022 Chemotherapy   Patient is on Treatment Plan : BREAST ADJUVANT DOSE DENSE AC q14d / PACLitaxel q7d      CURRENT THERAPY:  completing adjuvant radiation.  INTERVAL HISTORY:  Hannah Murray 47 y.o. female returns for f/u.  Certified spanish interpretor present. Today is her last day of radiation. Since her last visit her here, she c continues to do well.  Certified Spanish interpreter present during the entirety of conversation.  She denies any issues with radiation. Rest of the ROS reviewed and negative.   Patient Active Problem List   Diagnosis Date Noted   Port-A-Cath in place 03/11/2022   Primary malignant neoplasm of  upper outer quadrant of breast, right 06/11/2021    has No Known Allergies.  MEDICAL HISTORY: Past Medical History:  Diagnosis Date   Medical history non-contributory     SURGICAL HISTORY: Past Surgical History:   Procedure Laterality Date   BREAST RECONSTRUCTION WITH PLACEMENT OF TISSUE EXPANDER AND FLEX HD (ACELLULAR HYDRATED DERMIS) Right 09/30/2021   Procedure: BREAST RECONSTRUCTION WITH PLACEMENT OF TISSUE EXPANDER AND FLEX HD (ACELLULAR HYDRATED DERMIS);  Surgeon: Peggye Form, DO;  Location: [facility];  Service: Plastics;  Laterality: Right;   MASTECTOMY W/ SENTINEL NODE BIOPSY Right 09/30/2021   Procedure: RIGHT MASTECTOMY WITH SENTINEL LYMPH NODE BIOPSY;  Surgeon: Griselda Miner, MD;  Location: [facility];  Service: General;  Laterality: Right;   PORTACATH PLACEMENT Left 11/08/2021   Procedure: INSERTION PORT-A-CATH;  Surgeon: Griselda Miner, MD;  Location: [facility];  Service: General;  Laterality: Left;    SOCIAL HISTORY: Social History   Socioeconomic History   Marital status: Married    Spouse name: Not on file   Number of children: 1   Years of education: Not on file   Highest education level: High school graduate  Occupational History   Not on file  Tobacco Use   Smoking status: Never   Smokeless tobacco: Never  Vaping Use   Vaping Use: Never used  Substance and Sexual Activity   Alcohol use: Yes    Comment: occasionally   Drug use: Never   Sexual activity: Not Currently  Other Topics Concern   Not on file  Social History Narrative   Not on file   Social Determinants of Health   Financial Resource Strain: Not on file  Food Insecurity: No Food Insecurity (04/10/2022)   Hunger Vital Sign    Worried About Running Out of Food in the Last Year: Never true    Ran Out of Food in the Last Year: Never true  Transportation Needs: No Transportation Needs (04/10/2022)   PRAPARE - Administrator, Civil Service (Medical): No    Lack of Transportation (Non-Medical): No  Physical Activity: Not on file  Stress: Not on file  Social Connections: Not on file  Intimate Partner Violence: Not At Risk (04/10/2022)    Humiliation, Afraid, Rape, and Kick questionnaire    Fear of Current or Ex-Partner: No    Emotionally Abused: No    Physically Abused: No    Sexually Abused: No    FAMILY HISTORY: Family History  Problem Relation Age of Onset   Hypertension Father    Diabetes Father    Thyroid disease Father    Ovarian cancer Other        MGM's niece; d. > 50   REVIEW OF SYSTEMS:   Constitutional: Negative for appetite change, fatigue, chills, fever and unexpected weight change HENT: Negative for mouth sores, nosebleeds, sore throat and trouble swallowing.   Eyes: Negative for eye problems and icterus.  Respiratory: Negative for hemoptysis, shortness of breath and wheezing. +Mild cough mainly with infusion.  Cardiovascular: Negative for chest pain and leg swelling.  Gastrointestinal: Negative for abdominal pain, constipation, diarrhea, nausea and vomiting.  Genitourinary: Negative for bladder incontinence, difficulty urinating, dysuria, frequency and hematuria.   Musculoskeletal: Negative for back pain, gait problem, neck pain and neck stiffness.  Skin:Negative for rash and ulcers Neurological: Negative for dizziness, extremity weakness, gait problem, headaches, light-headedness and seizures.  Hematological: Negative for adenopathy. Does not bruise/bleed easily.  Psychiatric/Behavioral: Negative for confusion,  depression and sleep disturbance. The patient is not nervous/anxious.    PHYSICAL EXAMINATION  ECOG PERFORMANCE STATUS: 1 - Symptomatic but completely ambulatory  Vitals:   06/16/22 0843  BP: 115/69  Pulse: 96  Resp: 16  Temp: 97.9 F (36.6 C)  SpO2: 98%    Physical exam deferred today in lieu of counseling  LABORATORY DATA:  CBC    Component Value Date/Time   WBC 4.7 03/24/2022 1248   WBC 9.3 12/20/2021 0900   RBC 4.12 03/24/2022 1248   HGB 11.7 (L) 03/24/2022 1248   HCT 35.5 (L) 03/24/2022 1248   PLT 295 03/24/2022 1248   MCV 86.2 03/24/2022 1248   MCH 28.4 03/24/2022  1248   MCHC 33.0 03/24/2022 1248   RDW 13.7 03/24/2022 1248   LYMPHSABS 0.7 03/24/2022 1248   MONOABS 0.3 03/24/2022 1248   EOSABS 0.1 03/24/2022 1248   BASOSABS 0.0 03/24/2022 1248    CMP     Component Value Date/Time   NA 138 03/24/2022 1248   K 3.7 03/24/2022 1248   CL 104 03/24/2022 1248   CO2 28 03/24/2022 1248   GLUCOSE 89 03/24/2022 1248   BUN 15 03/24/2022 1248   CREATININE 0.62 03/24/2022 1248   CALCIUM 9.3 03/24/2022 1248   PROT 6.9 03/24/2022 1248   ALBUMIN 3.9 03/24/2022 1248   AST 14 (L) 03/24/2022 1248   ALT 14 03/24/2022 1248   ALKPHOS 52 03/24/2022 1248   BILITOT 0.3 03/24/2022 1248   GFRNONAA >60 03/24/2022 1248    ASSESSMENT and PLAN:  Hannah Murray is a 47 y.o. female who returns for a follow up for right breast cancer.   #Right breast intraductal carcinoma: --ER/PR positive, HER2 negative, clinically stage T2 N0 M0 grade 2/3  --Underwent right mastectomy on 09/30/2021 which showed grade 3 invasive ductal carcinoma measuring 4.3 cm in greatest dimension, margins negative along with solid type DCIS high-grade. Right axillary lymph node evaluation showed 1 out of 8 lymph nodes positive for malignancy.  --Oncotype test resulted at 24, benefit of chemotherapy cannot be excluded.  --Recommendation was adjuvant dose dense adriamycin and cytoxan q 14 days x 4 cycles followed by weekly taxol q 7 days x 12 cycles. --Patient completed adjuvant AC -T last cycle on 01/06/2022 -- She is now undergoing adjuvant radiation, today is her last day of radiation. --Following radiation, she will start her on ovarian suppression with anastrozole along with abemaciclib given lymph node metastasis. -- I have discussed about OFS and AI today again. We will start this in 2 weeks. I will see her back in a month and get her started on abemaciclib at that time. She will return to clinic in about 4 weeks for follow-up I have once again discussed about role of antiestrogen therapy,  adverse effects of antiestrogen therapy including menopausal symptoms, bone density loss, in detail.  She is agreeable to all the recommendations.  No problem-specific Assessment & Plan notes found for this encounter.   All questions were answered. The patient knows to call the clinic with any problems, questions or concerns. We can certainly see the patient much sooner if necessary.  I have spent a total of 30 minutes minutes of face-to-face and non-face-to-face time, preparing to see the patient, performing a medically appropriate examination, counseling and educating the patient, documenting clinical information in the electronic health record,  and care coordination.

## 2022-06-16 NOTE — Therapy (Signed)
OUTPATIENT PHYSICAL THERAPY SOZO SCREENING NOTE   Patient Name: Hannah Murray MRN: [id number] DOB:18-Jun-1975, 47 y.o., female Today's Date: 06/16/2022  PCP: [name], MD REFERRING PROVIDER: [name], MD   PT End of Session - 06/16/22 1532     Visit Number 2   # unchanged due to screen only   PT Start Time 1529    PT Stop Time 1534    PT Time Calculation (min) 5 min    Activity Tolerance Patient tolerated treatment well    Behavior During Therapy WFL for tasks assessed/performed             Past Medical History:  Diagnosis Date   Medical history non-contributory    Past Surgical History:  Procedure Laterality Date   BREAST RECONSTRUCTION WITH PLACEMENT OF TISSUE EXPANDER AND FLEX HD (ACELLULAR HYDRATED DERMIS) Right 09/30/2021   Procedure: BREAST RECONSTRUCTION WITH PLACEMENT OF TISSUE EXPANDER AND FLEX HD (ACELLULAR HYDRATED DERMIS);  Surgeon: Peggye Form, DO;  Location: [facility];  Service: Plastics;  Laterality: Right;   MASTECTOMY W/ SENTINEL NODE BIOPSY Right 09/30/2021   Procedure: RIGHT MASTECTOMY WITH SENTINEL LYMPH NODE BIOPSY;  Surgeon: Griselda Miner, MD;  Location: [facility];  Service: General;  Laterality: Right;   PORTACATH PLACEMENT Left 11/08/2021   Procedure: INSERTION PORT-A-CATH;  Surgeon: Griselda Miner, MD;  Location:  SURGERY CENTER;  Service: General;  Laterality: Left;   Patient Active Problem List   Diagnosis Date Noted   Port-A-Cath in place 03/11/2022   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 It measures 4.3 cm and is located in the upper-outer quadrant. She had surgery on 09/30/2021 for Right  Mastectomy with tissue expander and  with SLNB with 1/8 positive LN's. Pt started chemotherapy on 11/11/2021. She will also have radiation and chemotherapy.   PRECAUTIONS: right UE Lymphedema risk, None  SUBJECTIVE: Pt returns for her 3 month L-Dex screens. "I went to [facility] and they measured me for a sleeve but I never got it. Zella Ball said we could use [employer] but I don't know what happened because I never got it from the store and that was 3 months ago."  PAIN:  Are you having pain? No  SOZO SCREENING: Patient was assessed today using the SOZO machine to determine the lymphedema index score. This was compared to her baseline score. It was determined that she is within the recommended range when compared to her baseline and no further action is needed at this time. She will continue SOZO screenings. These are done every 3 months for 2 years post operatively followed by every 6 months for 2 years, and then annually.   L-DEX FLOWSHEETS - 06/16/22 1500       L-DEX LYMPHEDEMA SCREENING   Measurement Type Unilateral    L-DEX MEASUREMENT EXTREMITY Upper Extremity    POSITION  Standing    DOMINANT SIDE Right    At Risk Side Right    BASELINE SCORE (UNILATERAL) -1.7    L-DEX SCORE (UNILATERAL) 0.7    VALUE CHANGE (UNILAT) 2.4               Hermenia Bers, PTA 06/16/2022, 3:34 PM

## 2022-06-18 NOTE — Radiation Completion Notes (Signed)
  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      ==========DELIVERED PLANS==========  First Treatment Date: 2022-05-01 - Last Treatment Date: 2022-06-16   Plan Name: CW_R_BO Site: Chest Wall, Right Technique: 3D Mode: Photon Dose Per Fraction: 1.8 Gy Prescribed Dose (Delivered / Prescribed): 25.2 Gy / 25.2 Gy Prescribed Fxs (Delivered / Prescribed): 14 / 14   Plan Name: CW_R_PAB_SCV Site: Chest Wall, Right Technique: 3D Mode: Photon Dose Per Fraction: 1.8 Gy Prescribed Dose (Delivered / Prescribed): 50.4 Gy / 50.4 Gy Prescribed Fxs (Delivered / Prescribed): 28 / 28   Plan Name: CW_R_Bst_BO Site: Chest Wall, Right Technique: Electron Mode: Electron Dose Per Fraction: 2 Gy Prescribed Dose (Delivered / Prescribed): 10 Gy / 10 Gy Prescribed Fxs (Delivered / Prescribed): 5 / 5   Plan Name: CW_R Site: Chest Wall, Right Technique: 3D Mode: Photon Dose Per Fraction: 1.8 Gy Prescribed Dose (Delivered / Prescribed): 25.2 Gy / 25.2 Gy Prescribed Fxs (Delivered / Prescribed): 14 / 14     ==========ON TREATMENT VISIT DATES========== 2022-05-02, 2022-05-09, 2022-05-15, 2022-05-23, 2022-05-30, 2022-06-06, 2022-06-13  See weekly On Treatment Notes is Epic for details. The patient tolerated radiation. She developed fatigue and anticipated skin changes in the treatment field.   The patient will receive a call in about one month from the radiation oncology department. She will continue follow up with Dr. Al Pimple as well.      Osker Mason, PAC

## 2022-06-19 ENCOUNTER — Encounter: Payer: Self-pay | Admitting: Hematology and Oncology

## 2022-06-30 ENCOUNTER — Inpatient Hospital Stay: Payer: Self-pay | Attending: Hematology and Oncology

## 2022-06-30 VITALS — BP 117/69 | HR 93 | Resp 17

## 2022-06-30 DIAGNOSIS — Z170 Estrogen receptor positive status [ER+]: Secondary | ICD-10-CM | POA: Insufficient documentation

## 2022-06-30 DIAGNOSIS — Z5111 Encounter for antineoplastic chemotherapy: Secondary | ICD-10-CM | POA: Insufficient documentation

## 2022-06-30 DIAGNOSIS — C50411 Malignant neoplasm of upper-outer quadrant of right female breast: Secondary | ICD-10-CM | POA: Insufficient documentation

## 2022-06-30 DIAGNOSIS — Z79811 Long term (current) use of aromatase inhibitors: Secondary | ICD-10-CM | POA: Insufficient documentation

## 2022-06-30 DIAGNOSIS — Z95828 Presence of other vascular implants and grafts: Secondary | ICD-10-CM

## 2022-06-30 DIAGNOSIS — Z452 Encounter for adjustment and management of vascular access device: Secondary | ICD-10-CM | POA: Insufficient documentation

## 2022-06-30 DIAGNOSIS — C773 Secondary and unspecified malignant neoplasm of axilla and upper limb lymph nodes: Secondary | ICD-10-CM | POA: Insufficient documentation

## 2022-06-30 MED ORDER — GOSERELIN ACETATE 3.6 MG ~~LOC~~ IMPL
3.6000 mg | DRUG_IMPLANT | Freq: Once | SUBCUTANEOUS | Status: AC
  Administered 2022-06-30: 3.6 mg via SUBCUTANEOUS
  Filled 2022-06-30: qty 3.6

## 2022-06-30 NOTE — Patient Instructions (Signed)
Goserelin Implant What is this medication? GOSERELIN (GOE se rel in) treats prostate cancer and breast cancer. It works by decreasing levels of the hormones testosterone and estrogen in the body. This prevents prostate and breast cancer cells from spreading or growing. It may also be used to treat endometriosis. This is a condition where the tissue that lines the uterus grows outside the uterus. It works by decreasing the amount of estrogen your body makes, which reduces heavy bleeding and pain. It can also be used to help thin the lining of the uterus before a surgery used to prevent or reduce heavy periods. This medicine may be used for other purposes; ask your health care provider or pharmacist if you have questions. COMMON BRAND NAME(S): Zoladex, Zoladex 3-Month What should I tell my care team before I take this medication? They need to know if you have any of these conditions: Bone problems Diabetes Heart disease History of irregular heartbeat or rhythm An unusual or allergic reaction to goserelin, other medications, foods, dyes, or preservatives Pregnant or trying to get pregnant Breastfeeding How should I use this medication? This medication is injected under the skin. It is given by your care team in a hospital or clinic setting. Talk to your care team about the use of this medication in children. Special care may be needed. Overdosage: If you think you have taken too much of this medicine contact a poison control center or emergency room at once. NOTE: This medicine is only for you. Do not share this medicine with others. What if I miss a dose? Keep appointments for follow-up doses. It is important not to miss your dose. Call your care team if you are unable to keep an appointment. What may interact with this medication? Do not take this medication with any of the following: Cisapride Dronedarone Pimozide Thioridazine This medication may also interact with the following: Other  medications that cause heart rhythm changes This list may not describe all possible interactions. Give your health care provider a list of all the medicines, herbs, non-prescription drugs, or dietary supplements you use. Also tell them if you smoke, drink alcohol, or use illegal drugs. Some items may interact with your medicine. What should I watch for while using this medication? Visit your care team for regular checks on your progress. Your symptoms may appear to get worse during the first weeks of this therapy. Tell your care team if your symptoms do not start to get better or if they get worse after this time. Using this medication for a long time may weaken your bones. If you smoke or frequently drink alcohol you may increase your risk of bone loss. A family history of osteoporosis, chronic use of medications for seizures (convulsions), or corticosteroids can also increase your risk of bone loss. The risk of bone fractures may be increased. Talk to your care team about your bone health. This medication may increase blood sugar. The risk may be higher in patients who already have diabetes. Ask your care team what you can do to lower your risk of diabetes while taking this medication. This medication should stop regular monthly menstruation in women. Tell your care team if you continue to menstruate. Talk to your care team if you wish to become pregnant or think you might be pregnant. This medication can cause serious birth defects if taken during pregnancy or for 12 weeks after stopping treatment. Talk to your care team about reliable forms of contraception. Do not breastfeed while taking this   medication. This medication may cause infertility. Talk to your care team if you are concerned about your fertility. What side effects may I notice from receiving this medication? Side effects that you should report to your care team as soon as possible: Allergic reactions--skin rash, itching, hives, swelling  of the face, lips, tongue, or throat Change in the amount of urine Heart attack--pain or tightness in the chest, shoulders, arms, or jaw, nausea, shortness of breath, cold or clammy skin, feeling faint or lightheaded Heart rhythm changes--fast or irregular heartbeat, dizziness, feeling faint or lightheaded, chest pain, trouble breathing High blood sugar (hyperglycemia)--increased thirst or amount of urine, unusual weakness or fatigue, blurry vision High calcium level--increased thirst or amount of urine, nausea, vomiting, confusion, unusual weakness or fatigue, bone pain Pain, redness, irritation, or bruising at the injection site Severe back pain, numbness or weakness of the hands, arms, legs, or feet, loss of coordination, loss of bowel or bladder control Stroke--sudden numbness or weakness of the face, arm, or leg, trouble speaking, confusion, trouble walking, loss of balance or coordination, dizziness, severe headache, change in vision Swelling and pain of the tumor site or lymph nodes Trouble passing urine Side effects that usually do not require medical attention (report to your care team if they continue or are bothersome): Change in sex drive or performance Headache Hot flashes Rapid or extreme change in emotion or mood Sweating Swelling of the ankles, hands, or feet Unusual vaginal discharge, itching, or odor This list may not describe all possible side effects. Call your doctor for medical advice about side effects. You may report side effects to FDA at 1-800-FDA-1088. Where should I keep my medication? This medication is given in a hospital or clinic. It will not be stored at home. NOTE: This sheet is a summary. It may not cover all possible information. If you have questions about this medicine, talk to your doctor, pharmacist, or health care provider.  2023 Elsevier/Gold Standard (2021-06-26 00:00:00)  

## 2022-07-14 ENCOUNTER — Ambulatory Visit: Payer: Self-pay | Admitting: Hematology and Oncology

## 2022-07-17 ENCOUNTER — Inpatient Hospital Stay (HOSPITAL_BASED_OUTPATIENT_CLINIC_OR_DEPARTMENT_OTHER): Payer: Self-pay | Admitting: Adult Health

## 2022-07-17 ENCOUNTER — Ambulatory Visit: Payer: Self-pay

## 2022-07-17 ENCOUNTER — Inpatient Hospital Stay: Payer: Self-pay

## 2022-07-17 ENCOUNTER — Encounter: Payer: Self-pay | Admitting: Adult Health

## 2022-07-17 VITALS — BP 122/70 | HR 73 | Temp 97.7°F | Resp 18 | Ht 65.0 in | Wt 148.6 lb

## 2022-07-17 DIAGNOSIS — Z95828 Presence of other vascular implants and grafts: Secondary | ICD-10-CM

## 2022-07-17 DIAGNOSIS — C50411 Malignant neoplasm of upper-outer quadrant of right female breast: Secondary | ICD-10-CM

## 2022-07-17 DIAGNOSIS — Z1231 Encounter for screening mammogram for malignant neoplasm of breast: Secondary | ICD-10-CM

## 2022-07-17 LAB — CBC WITH DIFFERENTIAL (CANCER CENTER ONLY)
Abs Immature Granulocytes: 0.01 10*3/uL (ref 0.00–0.07)
Basophils Absolute: 0 10*3/uL (ref 0.0–0.1)
Basophils Relative: 0 %
Eosinophils Absolute: 0.2 10*3/uL (ref 0.0–0.5)
Eosinophils Relative: 4 %
HCT: 38.5 % (ref 36.0–46.0)
Hemoglobin: 12.3 g/dL (ref 12.0–15.0)
Immature Granulocytes: 0 %
Lymphocytes Relative: 22 %
Lymphs Abs: 0.9 10*3/uL (ref 0.7–4.0)
MCH: 26.9 pg (ref 26.0–34.0)
MCHC: 31.9 g/dL (ref 30.0–36.0)
MCV: 84.2 fL (ref 80.0–100.0)
Monocytes Absolute: 0.3 10*3/uL (ref 0.1–1.0)
Monocytes Relative: 7 %
Neutro Abs: 2.9 10*3/uL (ref 1.7–7.7)
Neutrophils Relative %: 67 %
Platelet Count: 227 10*3/uL (ref 150–400)
RBC: 4.57 MIL/uL (ref 3.87–5.11)
RDW: 13.2 % (ref 11.5–15.5)
WBC Count: 4.3 10*3/uL (ref 4.0–10.5)
nRBC: 0 % (ref 0.0–0.2)

## 2022-07-17 LAB — CMP (CANCER CENTER ONLY)
ALT: 19 U/L (ref 0–44)
AST: 17 U/L (ref 15–41)
Albumin: 4.2 g/dL (ref 3.5–5.0)
Alkaline Phosphatase: 57 U/L (ref 38–126)
Anion gap: 6 (ref 5–15)
BUN: 14 mg/dL (ref 6–20)
CO2: 27 mmol/L (ref 22–32)
Calcium: 9 mg/dL (ref 8.9–10.3)
Chloride: 106 mmol/L (ref 98–111)
Creatinine: 0.58 mg/dL (ref 0.44–1.00)
GFR, Estimated: >60 mL/min (ref >60)
Glucose, Bld: 81 mg/dL (ref 70–99)
Potassium: 3.8 mmol/L (ref 3.5–5.1)
Sodium: 139 mmol/L (ref 135–145)
Total Bilirubin: 0.4 mg/dL (ref 0.3–1.2)
Total Protein: 7.1 g/dL (ref 6.5–8.1)

## 2022-07-17 MED ORDER — SODIUM CHLORIDE 0.9% FLUSH
10.0000 mL | Freq: Once | INTRAVENOUS | Status: AC
  Administered 2022-07-17: 10 mL

## 2022-07-17 MED ORDER — HEPARIN SOD (PORK) LOCK FLUSH 100 UNIT/ML IV SOLN
500.0000 [IU] | Freq: Once | INTRAVENOUS | Status: AC
  Administered 2022-07-17: 500 [IU]

## 2022-07-17 NOTE — Assessment & Plan Note (Signed)
Tyneka is a 47 year old woman accompanied by Spanish interpreter for today's entire visit here today for follow-up of her 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  Stage IIa right breast invasive ductal carcinoma: She is tolerating the Zoladex and anastrozole well.  She will continue this.  We discussed Verzenio today in detail.  I gave her a handout about Verzenio in [place].  We will add on lab testing today and she will return on June 3 when her injection is due for labs and follow-up with Marylynn Pearson, CPP.  I asked Dr. Al Pimple send in the [facility] today.    I let her know that we will send the medication to [facility] and someone would contact her with instructions on when to pick the medication up and any additional information.  We will see her back on June 3 for labs, follow-up, and an injection.

## 2022-07-17 NOTE — Progress Notes (Signed)
[name] Cancer Follow up:    [name], MD [address] 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 Indeterminate mass in the right breast at 12:30, which may represent a fibroadenoma. No evidence of right axillary adenopathy.   05/16/2021 Pathology Results   Right breast 10 0 clock mass biopsy showed invasive mammary carcinoma, ductal phenotype prognostics include ER 95% strong staining intensity PR 95% strong staining intensity Ki-67 of 40% and HER2 negative   06/11/2021 Cancer Staging   Staging form: Breast, AJCC 8th Edition - Pathologic: Stage IIA (pT2, pN1a, cM0, G3, ER+, PR+, HER2-, Oncotype DX score: 24) - Signed by [name], MD on 11/04/2021 Multigene prognostic tests performed: Oncotype DX Recurrence score range: Greater than or equal to 11 Histologic grading system: 3 grade system   07/10/2021 Genetic Testing   Negative hereditary cancer genetic testing: no pathogenic variants detected [facility] Breast STAT Panel or Common Hereditary Cancers +RNA Panel.  Report dates are Jul 10, 2021 and Jul 18, 2021.   The STAT Breast cancer panel offered by [facility] includes sequencing and rearrangement analysis for the following 9 genes:  ATM, BRCA1, BRCA2, CDH1, CHEK2, PALB2, PTEN, STK11 and TP53.   The Common  Hereditary Cancers + RNA Panel offered by [facility] includes sequencing, deletion/duplication, and RNA testing of the following 47 genes: APC, ATM, AXIN2, BARD1, BMPR1A, BRCA1, BRCA2, BRIP1, CDH1, CDK4*, CDKN2A (p14ARF)*, CDKN2A (p16INK4a)*, CHEK2, CTNNA1, DICER1, EPCAM (Deletion/duplication testing only), GREM1 (promoter region deletion/duplication testing only), KIT, MEN1, MLH1, MSH2, MSH3, MSH6, MUTYH, NBN, NF1, NHTL1, PALB2, PDGFRA*, PMS2, POLD1, POLE, PTEN, RAD50, RAD51C, RAD51D, SDHB, SDHC, SDHD, SMAD4, SMARCA4. STK11, TP53, TSC1, TSC2, and VHL.  The following genes were evaluated for sequence changes only: SDHA and HOXB13 c.251G>A variant only.  RNA analysis is not performed for the * genes.     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  Right axillary lymph node evaluation showed 1 out of 8 lymph nodes positive for malignancy., final pathologic staging T2N1A.    11/11/2021 - 03/24/2022 Chemotherapy   Patient is on Treatment Plan : BREAST ADJUVANT DOSE DENSE AC q14d / PACLitaxel q7d     05/01/2022 - 06/16/2022 Radiation Therapy   First Treatment Date: 2022-05-01 - Last Treatment Date: 2022-06-16   Plan Name: CW_R_BO Site: Chest Wall, Right Technique: 3D Mode: Photon Dose Per Fraction: 1.8 Gy Prescribed Dose (Delivered / Prescribed): 25.2 Gy / 25.2 Gy Prescribed Fxs (Delivered / Prescribed): 14 / 14   Plan Name: CW_R_PAB_SCV Site: Chest Wall, Right Technique: 3D Mode: Photon Dose Per Fraction: 1.8 Gy Prescribed Dose (Delivered / Prescribed): 50.4 Gy / 50.4 Gy Prescribed Fxs (Delivered / Prescribed): 28 / 28  Plan Name: CW_R_Bst_BO Site: Chest Wall, Right Technique: Electron Mode: Electron Dose Per Fraction: 2 Gy Prescribed Dose (Delivered / Prescribed): 10 Gy / 10 Gy Prescribed Fxs (Delivered / Prescribed):  5 / 5   Plan Name: CW_R Site: Chest Wall, Right Technique: 3D Mode: Photon Dose Per Fraction: 1.8 Gy Prescribed Dose (Delivered / Prescribed): 25.2 Gy / 25.2 Gy Prescribed Fxs (Delivered / Prescribed): 14 / 14   06/30/2022 -  Anti-estrogen oral therapy   Zoladex injection every 4 weeks, and Anastrozole beginning 06/30/2022 Verzenio BID beginning at the end of May/early June, 2024     CURRENT THERAPY: Zoladex, Anastrozole  INTERVAL HISTORY: Hannah Murray 47 y.o. female returns for f/u of her breast cancer.  She is accompanied by Spanish interpreter Delorise Royals.  She received Zoladex on 06/30/2022, she began Anastrozole around this time as well.  She tells me that she is tolerating the anastrozole and Zoladex well.  She has mild arthralgias that are manageable along with mild hot flashes that are manageable.  She denies any issues today.  She tells me that she is healed well from radiation.   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  MEDICAL HISTORY: Past Medical History:  Diagnosis Date   Medical history non-contributory     SURGICAL HISTORY: Past Surgical History:  Procedure Laterality Date   BREAST RECONSTRUCTION WITH PLACEMENT OF TISSUE EXPANDER AND FLEX HD (ACELLULAR HYDRATED DERMIS) Right 09/30/2021   Procedure: BREAST RECONSTRUCTION WITH PLACEMENT OF TISSUE EXPANDER AND FLEX HD (ACELLULAR HYDRATED DERMIS);  Surgeon: Peggye Form, DO;  Location: [facility];  Service: Plastics;  Laterality: Right;   MASTECTOMY W/ SENTINEL NODE BIOPSY Right 09/30/2021   Procedure: RIGHT MASTECTOMY WITH SENTINEL LYMPH NODE BIOPSY;  Surgeon: Griselda Miner, MD;  Location: [facility];  Service: General;  Laterality: Right;   PORTACATH PLACEMENT Left 11/08/2021   Procedure: INSERTION PORT-A-CATH;   Surgeon: Griselda Miner, MD;  Location: [facility];  Service: General;  Laterality: Left;    SOCIAL HISTORY: Social History   Socioeconomic History   Marital status: Married    Spouse name: Not on file   Number of children: 1   Years of education: Not on file   Highest education level: High school graduate  Occupational History   Not on file  Tobacco Use   Smoking status: Never   Smokeless tobacco: Never  Vaping Use   Vaping Use: Never used  Substance and Sexual Activity   Alcohol use: Yes    Comment: occasionally   Drug use: Never   Sexual activity: Not Currently  Other Topics Concern   Not on file  Social History Narrative   Not on file   Social Determinants of Health   Financial Resource Strain: Not on file  Food Insecurity: No Food Insecurity (04/10/2022)   Hunger Vital Sign    Worried About Running Out of Food in the Last Year: Never true    Ran Out of Food in the Last Year: Never true  Transportation Needs: No Transportation Needs (04/10/2022)   PRAPARE - Administrator, Civil Service (Medical): No    Lack of Transportation (Non-Medical): No  Physical Activity: Not on file  Stress: Not on file  Social Connections: Not on file  Intimate Partner Violence: Not At  Risk (04/10/2022)   Humiliation, Afraid, Rape, and Kick questionnaire    Fear of Current or Ex-Partner: No    Emotionally Abused: No    Physically Abused: No    Sexually Abused: No    FAMILY HISTORY: Family History  Problem Relation Age of Onset   Hypertension Father    Diabetes Father    Thyroid disease Father    Ovarian cancer Other        MGM's niece; d. > 50    Review of Systems  Constitutional:  Negative for appetite change, chills, fatigue, fever and unexpected weight change.  HENT:   Negative for hearing loss, lump/mass and trouble swallowing.   Eyes:  Negative for eye problems and icterus.  Respiratory:  Negative for chest tightness, cough and shortness of  breath.   Cardiovascular:  Negative for chest pain, leg swelling and palpitations.  Gastrointestinal:  Negative for abdominal distention, abdominal pain, constipation, diarrhea, nausea and vomiting.  Endocrine: Positive for hot flashes.  Genitourinary:  Negative for difficulty urinating.   Musculoskeletal:  Positive for arthralgias.  Skin:  Negative for itching and rash.  Neurological:  Negative for dizziness, extremity weakness, headaches and numbness.  Hematological:  Negative for adenopathy. Does not bruise/bleed easily.  Psychiatric/Behavioral:  Negative for depression. The patient is not nervous/anxious.       PHYSICAL EXAMINATION   Onc Performance Status - 07/17/22 1433       ECOG Perf Status   ECOG Perf Status Restricted in physically strenuous activity but ambulatory and able to carry out work of a light or sedentary nature, e.g., light house work, office work      KPS SCALE   KPS % SCORE Able to carry on normal activity, minor s/s of disease             Vitals:   07/17/22 1418  BP: 122/70  Pulse: 73  Resp: 18  Temp: 97.7 F (36.5 C)  SpO2: 98%    Physical Exam Constitutional:      General: She is not in acute distress.    Appearance: Normal appearance. She is not toxic-appearing.  HENT:     Head: Normocephalic and atraumatic.     Mouth/Throat:     Mouth: Mucous membranes are moist.     Pharynx: Oropharynx is clear. No oropharyngeal exudate or posterior oropharyngeal erythema.  Eyes:     General: No scleral icterus. Cardiovascular:     Rate and Rhythm: Normal rate and regular rhythm.     Pulses: Normal pulses.     Heart sounds: Normal heart sounds.  Pulmonary:     Effort: Pulmonary effort is normal.     Breath sounds: Normal breath sounds.  Abdominal:     General: Abdomen is flat. Bowel sounds are normal. There is no distension.     Palpations: Abdomen is soft.     Tenderness: There is no abdominal tenderness.  Musculoskeletal:        General: No  swelling.     Cervical back: Neck supple.  Lymphadenopathy:     Cervical: No cervical adenopathy.  Skin:    General: Skin is warm and dry.     Findings: No rash.  Neurological:     General: No focal deficit present.     Mental Status: She is alert.  Psychiatric:        Mood and Affect: Mood normal.        Behavior: Behavior normal.     LABORATORY DATA:  CBC  Component Value Date/Time   WBC 4.7 03/24/2022 1248   WBC 9.3 12/20/2021 0900   RBC 4.12 03/24/2022 1248   HGB 11.7 (L) 03/24/2022 1248   HCT 35.5 (L) 03/24/2022 1248   PLT 295 03/24/2022 1248   MCV 86.2 03/24/2022 1248   MCH 28.4 03/24/2022 1248   MCHC 33.0 03/24/2022 1248   RDW 13.7 03/24/2022 1248   LYMPHSABS 0.7 03/24/2022 1248   MONOABS 0.3 03/24/2022 1248   EOSABS 0.1 03/24/2022 1248   BASOSABS 0.0 03/24/2022 1248    CMP     Component Value Date/Time   NA 138 03/24/2022 1248   K 3.7 03/24/2022 1248   CL 104 03/24/2022 1248   CO2 28 03/24/2022 1248   GLUCOSE 89 03/24/2022 1248   BUN 15 03/24/2022 1248   CREATININE 0.62 03/24/2022 1248   CALCIUM 9.3 03/24/2022 1248   PROT 6.9 03/24/2022 1248   ALBUMIN 3.9 03/24/2022 1248   AST 14 (L) 03/24/2022 1248   ALT 14 03/24/2022 1248   ALKPHOS 52 03/24/2022 1248   BILITOT 0.3 03/24/2022 1248   GFRNONAA >60 03/24/2022 1248      ASSESSMENT and THERAPY PLAN:   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  Stage IIa right breast invasive ductal carcinoma: She is tolerating the Zoladex and anastrozole well.  She will continue this.  We  discussed Verzenio today in detail.  I gave her a handout about Verzenio in [place].  We will add on lab testing today and she will return on June 3 when her injection is due for labs and follow-up with Marylynn Pearson, CPP.  I asked Dr. Al Pimple send in the [facility] today.    I let her know that we will send the medication to [facility] and someone would contact her with instructions on when to pick the medication up and any additional information.  We will see her back on June 3 for labs, follow-up, and an injection.  All questions were answered. The patient knows to call the clinic with any problems, questions or concerns. We can certainly see the patient much sooner if necessary.  Total encounter time:30 minutes*in face-to-face visit time, chart review, lab review, care coordination, order entry, and documentation of the encounter time.    Lillard Anes, NP 07/17/22 3:09 PM Medical Oncology and Hematology [facility] [address] [place], [place] [address] Tel. [phone]    Fax. [phone]  *Total Encounter Time as defined by the Centers for Medicare and Medicaid Services includes, in addition to the face-to-face time of a patient visit (documented in the note above) non-face-to-face time: obtaining and reviewing outside history, ordering and reviewing medications, tests or procedures, care coordination (communications with other health care professionals or caregivers) and documentation in the medical record.

## 2022-07-17 NOTE — Patient Instructions (Signed)
Abemaciclib Tablets Qu es este medicamento? El ABEMACICLIB trata el cncer de mama. Acta bloqueando una protena que hace que las clulas cancerosas crezcan y se multipliquen. Esto ayuda a Neurosurgeon propagacin de las clulas cancerosas. Este medicamento puede ser utilizado para otros usos; si tiene alguna pregunta consulte con su proveedor de atencin mdica o con su farmacutico. MARCAS COMUNES: VERZENIO Qu le debo informar a mi profesional de la salud antes de tomar este medicamento? Necesitan saber si usted presenta alguno de los siguientes problemas o situaciones: Cogulos sanguneos Diarrea Infeccin Enfermedad renal Enfermedad heptica Recuento bajo de glbulos blancos Enfermedad pulmonar Una reaccin alrgica o inusual al abemaciclib, a otros medicamentos, alimentos, colorantes o conservantes Si est embarazada o buscando quedar embarazada Si est amamantando a un beb Cmo debo Visual merchandiser medicamento? Tome este medicamento por va oral. Use el medicamento segn las instrucciones en la etiqueta a la misma hora todos [place]. No corte, triture ni [employer]. Trague las tabletas enteras. Puede tomarlo con o sin alimentos. Si el Social worker, tmelo con alimentos. Su equipo de atencin podra modificar su dosis o indicarle dejar de usar este medicamento si tiene efectos secundarios. No cambie su dosis ni deje de usarlo a menos que se lo indique su equipo de atencin. No tome este medicamento con jugo de toronja (pomelo). Hable con su equipo de atencin sobre el uso de este medicamento en nios. Puede requerir atencin especial. Sobredosis: Pngase en contacto inmediatamente con un centro toxicolgico o una sala de urgencia si usted cree que haya tomado demasiado medicamento. ATENCIN: [employer] es solo para usted. No comparta este medicamento con nadie. Qu sucede si me olvido de una dosis? Si olvida una dosis,  omita la dosis [place]. Administre la prxima dosis a la hora habitual. No administre una dosis adicional ni 2 dosis a la vez para compensar la dosis que olvid. Qu puede interactuar con este medicamento? [place] Ciertos antibiticos, tales como eritromicina o claritromicina Ciertos medicamentos antivirales para VIH o hepatitis Ciertos medicamentos para las infecciones micticas, tales como ketoconazol, itraconazol, posaconazol Ciertos medicamentos para convulsiones, tales como carbamazepina, fenobarbital, fenitona Diltiazem Efavirenz Jugo de toronja (pomelo) Modafinilo Rifampicina Verapamilo Puede ser que esta lista no menciona todas las posibles interacciones. Informe a su profesional de [employer] de [employer] productos a base de hierbas, medicamentos de [place] o suplementos nutritivos que est tomando. Si usted fuma, consume bebidas alcohlicas o si utiliza drogas ilegales, indqueselo tambin a su profesional de [employer]. Algunas sustancias pueden interactuar con su medicamento. A qu debo estar atento al usar [employer] Corporation? Se supervisar su estado de salud atentamente mientras reciba este medicamento. Usted podra necesitar realizarse [employer] de sangre mientras est usando [place]. Este medicamento podra hacerle sentir un Risk analyst. Esto no es inusual, ya que la quimioterapia puede afectar tanto a las clulas sanas como a las clulas cancerosas. Si presenta algn efecto secundario, infrmelo. Contine con el tratamiento incluso si se siente enfermo, a menos que su equipo de [employer] lo suspenda. Este medicamento puede aumentar su riesgo de contraer una infeccin. Llame para pedir consejo a su equipo de atencin si tiene fiebre, escalofros, dolor de garganta o cualquier otro sntoma de resfriado o gripe. No se trate usted mismo. Trate de no acercarse a personas que estn enfermas. Evite usar medicamentos que contengan aspirina, acetaminofeno,  ibuprofeno, naproxeno o ketoprofeno, a menos que as lo indique su equipo de atencin. Estos medicamentos  pueden ocultar la fiebre. Proceda con cuidado al cepillar sus dientes, usar hilo dental o Chemical engineer palillos para los dientes, ya que podra contraer una infeccin o Geophysicist/field seismologist con mayor facilidad. Si recibe algn tratamiento dental, informe a su dentista que est [employer]. Hable con su equipo de atencin si podra estar embarazada. Este medicamento puede causar defectos congnitos graves si se [place] durante el embarazo y por 3 semanas despus de la ltima dosis. Deber realizarse una prueba de embarazo y obtener resultado negativo antes de Games developer a Producer, television/film/video. Se recomienda utilizar un mtodo anticonceptivo mientras est usando este medicamento y por 3 semanas despus de la ltima dosis. Su equipo de atencin mdica puede ayudarle a Clinical research associate la opcin que mejor se adapte a sus necesidades. No debe amamantar a un beb mientras [place] este medicamento y por 3 semanas despus de la ltima dosis. Este medicamento podra causar infertilidad. Hable con su equipo de atencin si le preocupa su fertilidad. Qu efectos secundarios puedo tener al [employer] este medicamento? Efectos secundarios que debe informar a su equipo de atencin tan pronto como sea posible: Reacciones alrgicas: erupcin cutnea, comezn/picazn, urticaria, hinchazn de la cara, los labios, la lengua o la garganta Cogulo sanguneo: Engineer, mining, hinchazn, calor en una pierna, falta de aire, dolor en el [place], falta de aire o problemas para respirar Infeccin: fiebre, escalofros, tos, dolor de garganta, heridas que no sanan, dolor o problemas para Geographical information systems officer, sensacin general de molestia o Media planner en el hgado: dolor en la regin abdominal superior derecha, prdida de apetito, nuseas, heces de color claro, orina amarilla oscura o marrn, color amarillento de los ojos o la piel, debilidad o fatiga  inusuales Recuento bajo de glbulos rojos: debilidad o fatiga inusuales, mareo, dolor de cabeza, dificultad para respirar Diarrea grave o prolongada Sangrado o moretones inusuales Efectos secundarios que generalmente no requieren atencin mdica (debe informarlos a su equipo de atencin si persisten o si son molestos): Fatiga Cada del cabello Dolor de [place] Prdida del apetito Nuseas Dolor estomacal Puede ser que esta lista no menciona todos los posibles efectos secundarios. Comunquese a su mdico por asesoramiento mdico [employer]. Usted puede informar los efectos secundarios a la FDA por telfono al 1-800-FDA-1088. Dnde debo guardar mi medicina? Mantenga fuera del alcance de nios y Neurosurgeon. Guarde a [employer], entre 20 y 25 grados Celsius (68 y 34 grados Fahrenheit). Deseche todo el medicamento que no haya utilizado despus de la fecha de vencimiento. Para desechar los medicamentos que ya no necesite o que estn vencidos: Lleve el medicamento a un programa de recuperacin de medicamentos. Consulte con su farmacia o con una entidad reguladora para encontrar un lugar donde llevarlo. Si no puede Government social research officer, pregntele a Film/video editor o a su equipo de atencin cmo desecharlo de IT consultant. ATENCIN: Este folleto es un resumen. Puede ser que no cubra toda la posible informacin. Si usted tiene preguntas acerca de esta medicina, consulte con su mdico, su farmacutico o su profesional de Radiographer, therapeutic.  2024 Elsevier/Gold Standard (2022-01-01 00:00:00)

## 2022-07-18 ENCOUNTER — Other Ambulatory Visit: Payer: Self-pay | Admitting: Hematology and Oncology

## 2022-07-18 MED ORDER — ABEMACICLIB 150 MG PO TABS: 150.0000 mg | ORAL_TABLET | Freq: Two times a day (BID) | ORAL | 2 refills | Status: DC

## 2022-07-18 NOTE — Progress Notes (Signed)
Verzenio sent, pt is a candidate for adjuvant abemaciclib given high grade and pos LN.

## 2022-07-24 ENCOUNTER — Other Ambulatory Visit: Payer: Self-pay | Admitting: Obstetrics and Gynecology

## 2022-07-24 ENCOUNTER — Other Ambulatory Visit (HOSPITAL_COMMUNITY): Payer: Self-pay

## 2022-07-24 ENCOUNTER — Telehealth: Payer: Self-pay

## 2022-07-24 ENCOUNTER — Telehealth: Payer: Self-pay | Admitting: Pharmacy Technician

## 2022-07-24 DIAGNOSIS — Z1231 Encounter for screening mammogram for malignant neoplasm of breast: Secondary | ICD-10-CM

## 2022-07-24 NOTE — Telephone Encounter (Signed)
Oral Oncology Pharmacist Encounter  Received new prescription for Verzenio (abemaciclib) for the treatment of ER/PR positive, HER2 negative breast cancer in conjunction with anastrozole, planned duration until disease progression or unacceptable toxicity or for a total of 2 years.  Labs from 07/17/22 assessed, no interventions needed. Prescription dose and frequency assessed for appropriateness.   Current medication list in Epic reviewed, no significant/ relevant DDIs with Verzenio identified.  Evaluated chart and no patient barriers to medication adherence noted.   Patient agreement for treatment documented in MD note on 07/18/22.  Prescription has been e-scribed to the [facility] for benefits analysis and approval.  Oral Oncology Clinic will continue to follow for insurance authorization, copayment issues, initial counseling and start date.  Bethel Born, PharmD Hematology/Oncology Clinical Pharmacist [facility] Oral Chemotherapy Navigation Clinic [phone] 07/24/2022 9:39 AM

## 2022-07-24 NOTE — Telephone Encounter (Signed)
Oral Oncology Patient Advocate Encounter  After completing a benefits investigation, prior authorization for Verzenio is not required at this time as patient is uninsured. Will proceed with PAP process.     Jinger Neighbors, CPhT-Adv Oncology Pharmacy Patient Advocate [facility] Direct Number: [phone]  Fax: [phone]

## 2022-07-24 NOTE — Telephone Encounter (Signed)
Oral Oncology Patient Advocate Encounter   Began application for assistance for Verzenio through [employer].   Application will be submitted upon completion of necessary supporting documentation.  Patient will sign required portion at visit on 07/29/22.   [employer] number [phone].   I will continue to check the status until final determination.   Jinger Neighbors, CPhT-Adv Oncology Pharmacy Patient Advocate [facility] Direct Number: [phone]  Fax: [phone]

## 2022-07-25 ENCOUNTER — Telehealth: Payer: Self-pay | Admitting: Adult Health

## 2022-07-25 NOTE — Telephone Encounter (Signed)
Tried connecting with patient on 5/30 and 5/31 about upcoming appointment. Unable to get ahold of patient but was about to left a voicemail with the patients spouse 5/31 using the language solutions line provider by [facility]. [phone].

## 2022-07-28 ENCOUNTER — Inpatient Hospital Stay: Payer: Self-pay

## 2022-07-29 ENCOUNTER — Other Ambulatory Visit: Payer: Self-pay

## 2022-07-29 ENCOUNTER — Inpatient Hospital Stay: Payer: Self-pay

## 2022-07-29 ENCOUNTER — Inpatient Hospital Stay: Payer: Self-pay | Attending: Hematology and Oncology

## 2022-07-29 ENCOUNTER — Inpatient Hospital Stay: Payer: Self-pay | Admitting: Pharmacist

## 2022-07-29 VITALS — BP 128/80 | HR 93 | Temp 98.6°F | Resp 20 | Wt 150.0 lb

## 2022-07-29 DIAGNOSIS — Z5111 Encounter for antineoplastic chemotherapy: Secondary | ICD-10-CM | POA: Insufficient documentation

## 2022-07-29 DIAGNOSIS — C50411 Malignant neoplasm of upper-outer quadrant of right female breast: Secondary | ICD-10-CM

## 2022-07-29 DIAGNOSIS — Z95828 Presence of other vascular implants and grafts: Secondary | ICD-10-CM

## 2022-07-29 LAB — CBC WITH DIFFERENTIAL (CANCER CENTER ONLY)
Abs Immature Granulocytes: 0.01 10*3/uL (ref 0.00–0.07)
Basophils Absolute: 0 10*3/uL (ref 0.0–0.1)
Basophils Relative: 0 %
Eosinophils Absolute: 0.1 10*3/uL (ref 0.0–0.5)
Eosinophils Relative: 3 %
HCT: 38.1 % (ref 36.0–46.0)
Hemoglobin: 12.5 g/dL (ref 12.0–15.0)
Immature Granulocytes: 0 %
Lymphocytes Relative: 24 %
Lymphs Abs: 1.2 10*3/uL (ref 0.7–4.0)
MCH: 27.7 pg (ref 26.0–34.0)
MCHC: 32.8 g/dL (ref 30.0–36.0)
MCV: 84.3 fL (ref 80.0–100.0)
Monocytes Absolute: 0.4 10*3/uL (ref 0.1–1.0)
Monocytes Relative: 8 %
Neutro Abs: 3.1 10*3/uL (ref 1.7–7.7)
Neutrophils Relative %: 65 %
Platelet Count: 205 10*3/uL (ref 150–400)
RBC: 4.52 MIL/uL (ref 3.87–5.11)
RDW: 13.1 % (ref 11.5–15.5)
WBC Count: 4.8 10*3/uL (ref 4.0–10.5)
nRBC: 0 % (ref 0.0–0.2)

## 2022-07-29 LAB — CMP (CANCER CENTER ONLY)
ALT: 13 U/L (ref 0–44)
AST: 15 U/L (ref 15–41)
Albumin: 4.2 g/dL (ref 3.5–5.0)
Alkaline Phosphatase: 58 U/L (ref 38–126)
Anion gap: 5 (ref 5–15)
BUN: 19 mg/dL (ref 6–20)
CO2: 28 mmol/L (ref 22–32)
Calcium: 9.2 mg/dL (ref 8.9–10.3)
Chloride: 105 mmol/L (ref 98–111)
Creatinine: 0.61 mg/dL (ref 0.44–1.00)
GFR, Estimated: >60 mL/min (ref >60)
Glucose, Bld: 96 mg/dL (ref 70–99)
Potassium: 3.9 mmol/L (ref 3.5–5.1)
Sodium: 138 mmol/L (ref 135–145)
Total Bilirubin: 0.4 mg/dL (ref 0.3–1.2)
Total Protein: 7 g/dL (ref 6.5–8.1)

## 2022-07-29 MED ORDER — HEPARIN SOD (PORK) LOCK FLUSH 100 UNIT/ML IV SOLN
500.0000 [IU] | Freq: Once | INTRAVENOUS | Status: AC
  Administered 2022-07-29: 500 [IU]

## 2022-07-29 MED ORDER — GOSERELIN ACETATE 3.6 MG ~~LOC~~ IMPL
3.6000 mg | DRUG_IMPLANT | Freq: Once | SUBCUTANEOUS | Status: AC
  Administered 2022-07-29: 3.6 mg via SUBCUTANEOUS
  Filled 2022-07-29: qty 3.6

## 2022-07-29 MED ORDER — SODIUM CHLORIDE 0.9% FLUSH
10.0000 mL | Freq: Once | INTRAVENOUS | Status: AC
  Administered 2022-07-29: 10 mL

## 2022-07-29 NOTE — Progress Notes (Signed)
[facility]       Telephone: [phone]?Fax: [phone]   Oncology Clinical Pharmacist Practitioner Initial Assessment  Hannah Murray is a 47 y.o. female with a diagnosis of breast cancer. They were contacted today via in-person visit. She is accompanied by [place] interpreter Mariel.   Indication/Regimen Abemaciclib (Verzenio) is being used appropriately for treatment of breast cancer by Dr. Burnice Logan Iruku.      Wt Readings from Last 1 Encounters:  07/29/22 150 lb (68 kg)    Estimated body surface area is 1.77 meters squared as calculated from the following:   Height as of 07/17/22: 5\' 5"  (1.651 m).   Weight as of this encounter: 150 lb (68 kg).  The dosing regimen is 150 mg by mouth every 12 hours on days 1 to 28 of a 28-day cycle. This is being given  in combination with anastrozole (started 06/30/22) and goserelin (started 06/30/22) . It is planned to continue until two years in the adjuvant setting per the monarchE trial data.  Hannah Murray was seen today by clinical pharmacy after being referred by Dr. Al Pimple for her abemaciclib management.  She has not started this medication yet and asked today if she could start on 08/25/22 since she will be on vacation from 08/01/22 -08/22/22.  There are also some logistical items that we will need to happen as she did sign financial assistance forms for abemaciclib today.  These have been sent to our patient advocate specialist Jinger Neighbors who will be working on securing drug.  She continues on every 4-week goserelin which will be given today in combination with daily anastrozole.  She states that she is tolerating these both fine.  We reviewed today that manufacturing guidelines for abemaciclib recommend labs every 2 weeks for the first 2 months, followed by monthly labs for 2 months, followed by periodically or as clinically indicated.  Clinical pharmacy will plan on seeing her again around 2 weeks after starting abemaciclib  (09/08/22).  She plans on starting abemaciclib on 08/25/22 or when she obtains drug.  She will not have to drug sooner than when she gets back from vacation because no one will be at the house to accept the medication.  We did confirm that she has ondansetron (Zofran) at home should she need it and loperamide (Imodium). Ms. Christie stated that she would like to start vitamin D3 and we felt this to be reasonable with her current treatment regimen.   She will receive goserelin today and again on 08/25/22. This appointment is scheduled. She will get labs and see clinical pharmacy on 09/08/22 and then have labs, see Dr. Al Pimple, and goserelin on 09/22/22.   Dose Modifications No dose reductions at this time  Access Assessment Siniyah Oleson will be receiving abemaciclib likely through [facility] Concerns: copay assistance forms filled out today and sent back to patient advocate Jinger Neighbors Start date if known: unknown but tentatively the first week of July  Allergies No Known Allergies  Vitals    07/29/2022   10:33 AM 07/17/2022    2:18 PM 06/30/2022    8:41 AM  Oncology Vitals  Height  165 cm   Weight 68.04 kg 67.405 kg   Weight (lbs) 150 lbs 148 lbs 10 oz   BMI 24.96 kg/m2   24.96 kg/m2 24.73 kg/m2   24.73 kg/m2   Temp 98.6 F (37 C) 97.7 F (36.5 C)   Pulse Rate 93 73 93  BP 128/80 122/70 117/69  Resp 20 18 17   SpO2 100 % 98 % 100 %  BSA (m2) 1.77 m2   1.77 m2 1.76 m2   1.76 m2      Laboratory Data    Latest Ref Rng & Units 07/29/2022    9:38 AM 07/17/2022    2:54 PM 03/24/2022   12:48 PM  CBC EXTENDED  WBC 4.0 - 10.5 K/uL 4.8  4.3  4.7   RBC 3.87 - 5.11 MIL/uL 4.52  4.57  4.12   Hemoglobin 12.0 - 15.0 g/dL 16.1  09.6  04.5   HCT 36.0 - 46.0 % 38.1  38.5  35.5   Platelets 150 - 400 K/uL 205  227  295   NEUT# 1.7 - 7.7 K/uL 3.1  2.9  3.5   Lymph# 0.7 - 4.0 K/uL 1.2  0.9  0.7        Latest Ref Rng & Units 07/29/2022    9:38 AM 07/17/2022    2:54 PM  03/24/2022   12:48 PM  CMP  Glucose 70 - 99 mg/dL 96  81  89   BUN 6 - 20 mg/dL 19  14  15    Creatinine 0.44 - 1.00 mg/dL 4.09  8.11  9.14   Sodium 135 - 145 mmol/L 138  139  138   Potassium 3.5 - 5.1 mmol/L 3.9  3.8  3.7   Chloride 98 - 111 mmol/L 105  106  104   CO2 22 - 32 mmol/L 28  27  28    Calcium 8.9 - 10.3 mg/dL 9.2  9.0  9.3   Total Protein 6.5 - 8.1 g/dL 7.0  7.1  6.9   Total Bilirubin 0.3 - 1.2 mg/dL 0.4  0.4  0.3   Alkaline Phos 38 - 126 U/L 58  57  52   AST 15 - 41 U/L 15  17  14    ALT 0 - 44 U/L 13  19  14     Contraindications Contraindications were reviewed? Yes Contraindications to therapy were identified? No   Safety Precautions The following safety precautions for the use of abemaciclib were reviewed:  Diarrhea: we reviewed that diarrhea is common with abemaciclib and confirmed that she does have loperamide (Imodium) at home.  We reviewed how to take this medication PRN and gave her information on abemaciclib Neutropenia: we discussed the importance of having a thermometer and what the Centers for Disease Control and Prevention (CDC) considers a fever which is 100.10F (38C) or higher.  Gave patient 24/7 triage line to call if any fevers or symptoms ILD/Pneumonitis: we reviewed potential symptoms including cough, shortness, and fatigue. Hepatotoxicity: reviewed to contact clinic for RUQ pain that will not subside, yellowing of eyes/skin Venous thromboembolism (VTE): reviewed signs of deep vein thrombosis (DVT) such as leg swelling, redness, pain, or tenderness and signs of pulmonary embolism (PE) such as shortness of breath, rapid or irregular heartbeat, cough, chest pain, or lightheadedness Reviewed to take the medication every 12 hours (with food sometimes can be easier on the stomach) and to take it at the same time every day. Discussed proper storage and handling of abemaciclib She is not having periods at this time due to the goserelin but did speak about avoiding  getting pregnant Fatigue Alopecia Nausea/Vomiting Dysgeusia Avoid grapefruit products  Medication Reconciliation Current Outpatient Medications  Medication Sig Dispense Refill   abemaciclib (VERZENIO) 150 MG tablet Take 1 tablet (150 mg total) by mouth 2 (two) times daily. 56 tablet 2   anastrozole (  ARIMIDEX) 1 MG tablet Take 1 tablet (1 mg total) by mouth daily. 90 tablet 3   diphenhydrAMINE (BENADRYL) 25 MG tablet Take 25 mg by mouth every 6 (six) hours as needed for itching.     No current facility-administered medications for this visit.   Facility-Administered Medications Ordered in Other Visits  Medication Dose Route Frequency Provider Last Rate Last Admin   goserelin (ZOLADEX) injection 3.6 mg  3.6 mg Subcutaneous Once Iruku, Praveena, MD       Medication reconciliation is based on the patient's most recent medication list in the electronic medical record (EMR) including herbal products and OTC medications.   The patient's medication list was reviewed today with the patient? Yes   Drug-drug interactions (DDIs) DDIs were evaluated? Yes Significant DDIs identified? No   Drug-Food Interactions Drug-food interactions were evaluated? Yes Drug-food interactions identified?  Avoid grapefruit products  Follow-up Plan  Start abemaciclib 150 mg by mouth every 12 hours once she returns from vacation (patient preference). Likely the first week of July. Patient assistance forms signed and sent to [facility] Continue goserelin 3.6 mg subcutaneously every 28 days. Due today and again on 08/25/22 Continue anastrozole 1 mg by mouth Labs, pharmacy clinic on 09/08/22 Labs, Dr. Al Pimple, goserelin on 09/22/22  [name] participated in the discussion, expressed understanding, and voiced agreement with the above plan. All questions were answered to her satisfaction. The patient was advised to contact the clinic at [phone] with any questions or concerns prior to her return visit.   I  spent 60 minutes assessing the patient.  Hyrum Shaneyfelt A. Odetta Pink, PharmD, BCOP, CPP  Anselm Lis, RPH-CPP, 07/29/2022 12:01 PM  **Disclaimer: This note was dictated with voice recognition software. Similar sounding words can inadvertently be transcribed and this note may contain transcription errors which may not have been corrected upon publication of note.**

## 2022-07-30 NOTE — Telephone Encounter (Signed)
Oral Oncology Patient Advocate Encounter  All paperwork collected and signed. Patient estimated start date is 08/25/22.   Will wait to submit application until 2 weeks prior as patient is travelling and would not be home to receive any shipments in the interim.  Jinger Neighbors, CPhT-Adv Oncology Pharmacy Patient Advocate [facility] Direct Number: [phone]  Fax: [phone]

## 2022-08-11 ENCOUNTER — Ambulatory Visit
Admission: RE | Admit: 2022-08-11 | Discharge: 2022-08-11 | Disposition: A | Discharge status: Home / Self Care | Payer: Self-pay | Source: Ambulatory Visit | Attending: Radiation Oncology | Admitting: Radiation Oncology

## 2022-08-11 NOTE — Progress Notes (Signed)
  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 Voicemail left in spanish using [employer] w/ Jari Favre ID# [phone].    The patient was encouraged to avoid sun exposure in the area of prior treatment for up to one year following radiation with either sunscreen or by the style of clothing worn in the sun.  The patient has scheduled follow up with her medical oncologist Dr. Al Pimple for ongoing surveillance, and was encouraged to call if she develops concerns or questions regarding radiation.    Ruel Favors, LPN

## 2022-08-13 NOTE — Telephone Encounter (Signed)
Oral Oncology Patient Advocate Encounter   Received notification that the application for assistance for Verzenio through [employer] has been approved.   [employer] number [phone].   Effective dates: 08/13/22 through 08/13/23  Medication will be filled at [facility].  Jinger Neighbors, CPhT-Adv Oncology Pharmacy Patient Advocate [facility] Direct Number: [phone]  Fax: [phone]

## 2022-08-13 NOTE — Telephone Encounter (Signed)
Oral Oncology Patient Advocate Encounter   Submitted application for assistance for Verzenio to [employer].   Application submitted via e-fax to [phone]   [facility] phone number [phone].   I will continue to check the status until final determination.   Jinger Neighbors, CPhT-Adv Oncology Pharmacy Patient Advocate [facility] Direct Number: [phone]  Fax: [phone]

## 2022-08-25 ENCOUNTER — Inpatient Hospital Stay: Payer: Self-pay | Attending: Hematology and Oncology

## 2022-08-25 ENCOUNTER — Other Ambulatory Visit: Payer: Self-pay

## 2022-08-25 VITALS — BP 117/75 | HR 83 | Temp 98.5°F | Resp 20

## 2022-08-25 DIAGNOSIS — Z170 Estrogen receptor positive status [ER+]: Secondary | ICD-10-CM | POA: Insufficient documentation

## 2022-08-25 DIAGNOSIS — Z95828 Presence of other vascular implants and grafts: Secondary | ICD-10-CM

## 2022-08-25 DIAGNOSIS — Z79811 Long term (current) use of aromatase inhibitors: Secondary | ICD-10-CM | POA: Insufficient documentation

## 2022-08-25 DIAGNOSIS — Z5111 Encounter for antineoplastic chemotherapy: Secondary | ICD-10-CM | POA: Insufficient documentation

## 2022-08-25 DIAGNOSIS — Z452 Encounter for adjustment and management of vascular access device: Secondary | ICD-10-CM | POA: Insufficient documentation

## 2022-08-25 DIAGNOSIS — R197 Diarrhea, unspecified: Secondary | ICD-10-CM | POA: Insufficient documentation

## 2022-08-25 DIAGNOSIS — C50411 Malignant neoplasm of upper-outer quadrant of right female breast: Secondary | ICD-10-CM | POA: Insufficient documentation

## 2022-08-25 MED ORDER — GOSERELIN ACETATE 3.6 MG ~~LOC~~ IMPL
3.6000 mg | DRUG_IMPLANT | Freq: Once | SUBCUTANEOUS | Status: AC
  Administered 2022-08-25: 3.6 mg via SUBCUTANEOUS
  Filled 2022-08-25: qty 3.6

## 2022-09-08 ENCOUNTER — Other Ambulatory Visit: Payer: Self-pay

## 2022-09-08 ENCOUNTER — Inpatient Hospital Stay: Payer: Self-pay | Admitting: Pharmacist

## 2022-09-08 ENCOUNTER — Inpatient Hospital Stay: Payer: Self-pay

## 2022-09-08 VITALS — BP 135/74 | HR 88 | Temp 98.1°F | Resp 16 | Wt 148.2 lb

## 2022-09-08 DIAGNOSIS — Z95828 Presence of other vascular implants and grafts: Secondary | ICD-10-CM

## 2022-09-08 DIAGNOSIS — C50411 Malignant neoplasm of upper-outer quadrant of right female breast: Secondary | ICD-10-CM

## 2022-09-08 LAB — CBC WITH DIFFERENTIAL (CANCER CENTER ONLY)
Abs Immature Granulocytes: 0.01 10*3/uL (ref 0.00–0.07)
Basophils Absolute: 0 10*3/uL (ref 0.0–0.1)
Basophils Relative: 1 %
Eosinophils Absolute: 0.1 10*3/uL (ref 0.0–0.5)
Eosinophils Relative: 4 %
HCT: 38.9 % (ref 36.0–46.0)
Hemoglobin: 12.7 g/dL (ref 12.0–15.0)
Immature Granulocytes: 0 %
Lymphocytes Relative: 27 %
Lymphs Abs: 1 10*3/uL (ref 0.7–4.0)
MCH: 27.9 pg (ref 26.0–34.0)
MCHC: 32.6 g/dL (ref 30.0–36.0)
MCV: 85.3 fL (ref 80.0–100.0)
Monocytes Absolute: 0.2 10*3/uL (ref 0.1–1.0)
Monocytes Relative: 5 %
Neutro Abs: 2.4 10*3/uL (ref 1.7–7.7)
Neutrophils Relative %: 63 %
Platelet Count: 209 10*3/uL (ref 150–400)
RBC: 4.56 MIL/uL (ref 3.87–5.11)
RDW: 11.9 % (ref 11.5–15.5)
WBC Count: 3.8 10*3/uL — ABNORMAL LOW (ref 4.0–10.5)
nRBC: 0 % (ref 0.0–0.2)

## 2022-09-08 LAB — CMP (CANCER CENTER ONLY)
ALT: 11 U/L (ref 0–44)
AST: 13 U/L — ABNORMAL LOW (ref 15–41)
Albumin: 4.1 g/dL (ref 3.5–5.0)
Alkaline Phosphatase: 54 U/L (ref 38–126)
Anion gap: 5 (ref 5–15)
BUN: 14 mg/dL (ref 6–20)
CO2: 28 mmol/L (ref 22–32)
Calcium: 9.5 mg/dL (ref 8.9–10.3)
Chloride: 104 mmol/L (ref 98–111)
Creatinine: 0.87 mg/dL (ref 0.44–1.00)
GFR, Estimated: >60 mL/min (ref >60)
Glucose, Bld: 85 mg/dL (ref 70–99)
Potassium: 3.8 mmol/L (ref 3.5–5.1)
Sodium: 137 mmol/L (ref 135–145)
Total Bilirubin: 0.6 mg/dL (ref 0.3–1.2)
Total Protein: 7 g/dL (ref 6.5–8.1)

## 2022-09-08 MED ORDER — SODIUM CHLORIDE 0.9% FLUSH
10.0000 mL | Freq: Once | INTRAVENOUS | Status: AC
  Administered 2022-09-08: 10 mL

## 2022-09-08 MED ORDER — HEPARIN SOD (PORK) LOCK FLUSH 100 UNIT/ML IV SOLN
500.0000 [IU] | Freq: Once | INTRAVENOUS | Status: AC
  Administered 2022-09-08: 500 [IU]

## 2022-09-08 NOTE — Progress Notes (Signed)
[facility]       Telephone: [phone]?Fax: [phone]   Oncology Clinical Pharmacist Practitioner Progress Note  Hannah Murray was contacted via in-person to discuss her chemotherapy regimen for abemaciclib which they receive under the care of Dr. [name]. She is accompanied by Spanish interpreter Hannah Murray.  Current treatment regimen and start date Abemaciclib (08/25/22) Anastrozole (06/16/22) Goserelin (06/30/22)  Interval History She continues on abemaciclib 150 mg by mouth every 12 hours on days 1 to 28 of a 28-day cycle. This is being given in combination with anastrozole and goserelin . Therapy is planned to continue until two years in the adjuvant setting per the monarchE trial data. Hannah Murray was seen today for a follow up visit regarding her abemaciclib management. She last saw clinical pharmacy on 07/29/22 and Dr. Al Pimple on 06/16/22. She also saw nurse practitioner Lillard Anes on 07/17/22. She is do for goserelin again on 09/22/22 which she receives every 4 weeks.  Response to Therapy Hannah Murray has several side effects to report today which are loose stools, stomach cramping, joint pain, and headaches. With regards to the loose stools and stomach cramping, the stomach cramping comes on right before she has a loose stool. She then takes loperamide which stops the loose stools. She normally takes 1 tablet of loperamide morning and then one more in the afternoon after the loose stools. Because the stomach cramping appears to be coming from the diarrhea, we did discuss that she should try to take loperamide, 1 tablet every other day to see if this may help decrease the amount of loose stools she is having. We said she could also consider taking loperamide 1 tablet daily. We explained that if she does this she would need to monitor for constipation very closely and limit the loperamide she is taking should this occur. She is not having any fevers or symptoms of  injection and her labs overall look very good. She is drinking plenty of fluids. We also gave her some information in Spanish regarding how to manage diarrhea with diet.  With regards to her joint pain and headaches, this is likely due to the abemaciclib as she states this was not occurring prior to starting while she was just on anastrozole. We said she could consider using acetaminophen which she has used in the past or other OTC pain relievers such as ibuprofen or naproxen. She says the headaches started soon after starting abemaciclib and happen daily. She has been afebrile per her report and we have discussed that she should take her temperature prior to starting any OTC pain relievers to ensure she is not febrile. Her ANC is WNL today although WBC has trended down. We gave her information on non-pharmacological options as well for joint pain in Spanish.  She has been taking ondansetron for nausea which is working well per her report. We did discuss that if she continues to have side effects, we could consider lowering the dose of abemaciclib in two weeks when she next sees Lillard Anes NP. She will then see clinical pharmacy 4 weeks from today and Dr. Al Pimple 6 weeks from today. After that, she will likely start monthly labs for 2 months. Hannah Murray was in agreement with this plan. She is taking her abemaciclib at 8 am and 8 pm with food. Labs, vitals, treatment parameters, and manufacturer guidelines assessing toxicity were reviewed with Hannah Murray today. Based on these values, patient is in agreement to continue abemaciclib therapy at  this time.  Allergies No Known Allergies  Vitals    09/08/2022    9:06 AM 08/25/2022    8:48 AM 07/29/2022   10:33 AM  Oncology Vitals  Weight 67.223 kg  68.04 kg  Weight (lbs) 148 lbs 3 oz  150 lbs  BMI 24.66 kg/m2  24.96 kg/m2  Temp 98.1 F (36.7 C) 98.5 F (36.9 C) 98.6 F (37 C)  Pulse Rate 88 83 93  BP 135/74 117/75 128/80  Resp 16 20 20   SpO2 100  % 98 % 100 %  BSA (m2) 1.76 m2  1.77 m2    Laboratory Data    Latest Ref Rng & Units 09/08/2022    8:22 AM 07/29/2022    9:38 AM 07/17/2022    2:54 PM  CBC EXTENDED  WBC 4.0 - 10.5 K/uL 3.8  4.8  4.3   RBC 3.87 - 5.11 MIL/uL 4.56  4.52  4.57   Hemoglobin 12.0 - 15.0 g/dL 16.1  09.6  04.5   HCT 36.0 - 46.0 % 38.9  38.1  38.5   Platelets 150 - 400 K/uL 209  205  227   NEUT# 1.7 - 7.7 K/uL 2.4  3.1  2.9   Lymph# 0.7 - 4.0 K/uL 1.0  1.2  0.9        Latest Ref Rng & Units 09/08/2022    8:22 AM 07/29/2022    9:38 AM 07/17/2022    2:54 PM  CMP  Glucose 70 - 99 mg/dL 85  96  81   BUN 6 - 20 mg/dL 14  19  14    Creatinine 0.44 - 1.00 mg/dL 4.09  8.11  9.14   Sodium 135 - 145 mmol/L 137  138  139   Potassium 3.5 - 5.1 mmol/L 3.8  3.9  3.8   Chloride 98 - 111 mmol/L 104  105  106   CO2 22 - 32 mmol/L 28  28  27    Calcium 8.9 - 10.3 mg/dL 9.5  9.2  9.0   Total Protein 6.5 - 8.1 g/dL 7.0  7.0  7.1   Total Bilirubin 0.3 - 1.2 mg/dL 0.6  0.4  0.4   Alkaline Phos 38 - 126 U/L 54  58  57   AST 15 - 41 U/L 13  15  17    ALT 0 - 44 U/L 11  13  19      Adverse Effects Assessment Diarrhea, stomach cramping: she will try loperamide every other day to see if this may stop the diarrhea which is associated with stomach cramping right before having the use the bathroom. She will monitor for constipation. Also gave her information on diet Joint pain / Headaches: new since starting abemaciclib. ANC WNL. She will check temperature first before starting any OTC pain relievers. Gave her several options. Acetaminophen has worked well for her in the past  Adherence Assessment Hannah Murray reports missing 0 doses over the past 2 weeks.   Reason for missed dose: N/A Patient was re-educated on importance of adherence.   Access Assessment Hannah Murray is currently receiving her abemaciclib through [employer] concerns:  none  Medication Reconciliation The patient's medication list  was reviewed today with the patient? Yes New medications or herbal supplements have recently been started? No  Any medications have been discontinued? No  The medication list was updated and reconciled based on the patient's most recent medication list in the electronic medical record (EMR) including herbal products and OTC  medications.   Medications Current Outpatient Medications  Medication Sig Dispense Refill   abemaciclib (VERZENIO) 150 MG tablet Take 1 tablet (150 mg total) by mouth 2 (two) times daily. 56 tablet 2   anastrozole (ARIMIDEX) 1 MG tablet Take 1 tablet (1 mg total) by mouth daily. 90 tablet 3   loperamide (IMODIUM A-D) 2 MG tablet Take 2 mg by mouth as needed for diarrhea or loose stools. Take 2 tabs (4 mg) with first loose stool, then 1 tab (2 mg) with each additional loose stool. Do not take more than 8 tabs (16 mg) in a 24-hour period.     diphenhydrAMINE (BENADRYL) 25 MG tablet Take 25 mg by mouth every 6 (six) hours as needed for itching. (Patient not taking: Reported on 09/08/2022)     No current facility-administered medications for this visit.   Drug-Drug Interactions (DDIs) DDIs were evaluated? Yes Significant DDIs? No  The patient was instructed to speak with their health care provider and/or the oral chemotherapy pharmacist before starting any new drug, including prescription or over the counter, natural / herbal products, or vitamins.  Supportive Care Diarrhea: we reviewed that diarrhea is common with abemaciclib and confirmed that she does have loperamide (Imodium) at home.  We reviewed how to take this medication PRN. Neutropenia: we discussed the importance of having a thermometer and what the Centers for Disease Control and Prevention (CDC) considers a fever which is 100.37F (38C) or higher.  Gave patient 24/7 triage line to call if any fevers or symptoms. ILD/Pneumonitis: we reviewed potential symptoms including cough, shortness, and fatigue.  VTE: reviewed  signs of DVT such as leg swelling, redness, pain, or tenderness and signs of PE such as shortness of breath, rapid or irregular heartbeat, cough, chest pain, or lightheadedness. Reviewed to take the medication every 12 hours (with food sometimes can be easier on the stomach) and to take it at the same time every day. Hepatotoxicity:WNL Headaches / Joint pain: she will start OTC pain relievers PRN and take temperature prior to ensure not febrile Drug interactions with grapefruit products  Dosing Assessment Hepatic adjustments needed? No  Renal adjustments needed? No  Toxicity adjustments needed? No , will consider reducing dose of abemaciclib to 100 mg every 12 hours if no improvement The current dosing regimen is appropriate to continue at this time.  Follow-Up Plan Continue abemaciclib 150 mg by mouth every 12 hours. At two week mark today. Consider reducing dose to 100 mg every 12 hour if no improvement in two weeks when seeing Lillard Anes NP Continue anastrozole 1 mg by mouth daily Continue goserelin 3.5 mg subcutaneously every 28 days. Next due on 09/22/22 Continue ondansetron 8 mg by mouth every 8 hours PRN for nausea Consider loperamide every other day for loose stools which are associated with stomach cramping. Cramping goes away after loose stool Consider OTC pain relievers (acetaminophen or ibuprofen or naproxen) for joint pain and headaches. These started soon after starting [employer], [place] visit, and goserelin on 09/22/22. Check to see if port is still necessary or can it be removed Will add port labs, pharmacy clinic visit in four weeks (10/06/22) Will add port labs, Dr. Al Pimple visit,and goserelin in 6 weeks (10/20/22)  Hannah Murray participated in the discussion, expressed understanding, and voiced agreement with the above plan. All questions were answered to her satisfaction. The patient was advised to contact the clinic at [phone] with any questions  or concerns prior to her return visit.  I spent 60 minutes assessing and educating the patient.  Erian Rosengren A. Odetta Pink, PharmD, BCOP, CPP  Anselm Lis, RPH-CPP, 09/08/2022  10:00 AM   **Disclaimer: This note was dictated with voice recognition software. Similar sounding words can inadvertently be transcribed and this note may contain transcription errors which may not have been corrected upon publication of note.**

## 2022-09-15 ENCOUNTER — Ambulatory Visit: Payer: Self-pay | Attending: Hematology and Oncology | Admitting: Rehabilitation

## 2022-09-15 ENCOUNTER — Encounter: Payer: Self-pay | Admitting: Rehabilitation

## 2022-09-15 DIAGNOSIS — Z483 Aftercare following surgery for neoplasm: Secondary | ICD-10-CM | POA: Insufficient documentation

## 2022-09-15 DIAGNOSIS — R293 Abnormal posture: Secondary | ICD-10-CM | POA: Insufficient documentation

## 2022-09-15 DIAGNOSIS — C50411 Malignant neoplasm of upper-outer quadrant of right female breast: Secondary | ICD-10-CM | POA: Insufficient documentation

## 2022-09-15 NOTE — Therapy (Signed)
  OUTPATIENT PHYSICAL THERAPY SOZO SCREENING NOTE   Patient Name: Hannah Murray MRN: [id number] DOB:[birth year], 47 y.o., female Today's Date: 09/15/2022  PCP: [name], MD REFERRING PROVIDER: [name], MD   PT End of Session - 09/15/22 1542     Visit Number 2   screen   PT Start Time 1540    PT Stop Time 1542    PT Time Calculation (min) 2 min    Activity Tolerance Patient tolerated treatment well             Past Medical History:  Diagnosis Date   Medical history non-contributory    Past Surgical History:  Procedure Laterality Date   BREAST RECONSTRUCTION WITH PLACEMENT OF TISSUE EXPANDER AND FLEX HD (ACELLULAR HYDRATED DERMIS) Right 09/30/2021   Procedure: BREAST RECONSTRUCTION WITH PLACEMENT OF TISSUE EXPANDER AND FLEX HD (ACELLULAR HYDRATED DERMIS);  Surgeon: Peggye Form, DO;  Location: [facility];  Service: Plastics;  Laterality: Right;   MASTECTOMY W/ SENTINEL NODE BIOPSY Right 09/30/2021   Procedure: RIGHT MASTECTOMY WITH SENTINEL LYMPH NODE BIOPSY;  Surgeon: Griselda Miner, MD;  Location: [facility];  Service: General;  Laterality: Right;   PORTACATH PLACEMENT Left 11/08/2021   Procedure: INSERTION PORT-A-CATH;  Surgeon: Griselda Miner, MD;  Location: [facility];  Service: General;  Laterality: Left;   Patient Active Problem List   Diagnosis Date Noted   Port-A-Cath in place 03/11/2022   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, right (HCC) 06/11/2021    REFERRING DIAG: right breast cancer at risk for lymphedema  THERAPY DIAG:  Aftercare following surgery for neoplasm  Malignant neoplasm of upper-outer quadrant of right female breast, unspecified estrogen receptor status (HCC)  Abnormal posture  PERTINENT HISTORY: Patient was diagnosed on 05/21/2021 with 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 It measures 4.3 cm and is located in the  upper-outer quadrant. She had surgery on 09/30/2021 for Right Mastectomy with tissue expander and  with SLNB with 1/8 positive LN's. Pt started chemotherapy on 11/11/2021. She will also have radiation and chemotherapy.   PRECAUTIONS: right UE Lymphedema risk, None  SUBJECTIVE: Pt returns for her 3 month L-Dex screens.   PAIN:  Are you having pain? No  SOZO SCREENING: Patient was assessed today using the SOZO machine to determine the lymphedema index score. This was compared to her baseline score. It was determined that she is within the recommended range when compared to her baseline and no further action is needed at this time. She will continue SOZO screenings. These are done every 3 months for 2 years post operatively followed by every 6 months for 2 years, and then annually.   L-DEX FLOWSHEETS - 09/15/22 1500       L-DEX LYMPHEDEMA SCREENING   Measurement Type Unilateral    L-DEX MEASUREMENT EXTREMITY Upper Extremity    POSITION  Standing    DOMINANT SIDE Right    At Risk Side Right    BASELINE SCORE (UNILATERAL) -1.7    L-DEX SCORE (UNILATERAL) 1    VALUE CHANGE (UNILAT) 2.7               Idamae Lusher, PT 09/15/2022, 3:43 PM

## 2022-09-18 ENCOUNTER — Ambulatory Visit: Payer: Self-pay | Admitting: Hematology and Oncology

## 2022-09-18 ENCOUNTER — Encounter: Payer: Self-pay | Admitting: Hematology and Oncology

## 2022-09-18 ENCOUNTER — Ambulatory Visit
Admission: RE | Admit: 2022-09-18 | Discharge: 2022-09-18 | Disposition: A | Discharge status: Home / Self Care | Payer: No Typology Code available for payment source | Source: Ambulatory Visit | Attending: Obstetrics and Gynecology | Admitting: Obstetrics and Gynecology

## 2022-09-18 VITALS — BP 131/69 | Wt 148.0 lb

## 2022-09-18 DIAGNOSIS — Z1211 Encounter for screening for malignant neoplasm of colon: Secondary | ICD-10-CM

## 2022-09-18 DIAGNOSIS — Z01419 Encounter for gynecological examination (general) (routine) without abnormal findings: Secondary | ICD-10-CM

## 2022-09-18 DIAGNOSIS — Z1239 Encounter for other screening for malignant neoplasm of breast: Secondary | ICD-10-CM

## 2022-09-18 DIAGNOSIS — Z1231 Encounter for screening mammogram for malignant neoplasm of breast: Secondary | ICD-10-CM

## 2022-09-18 NOTE — Patient Instructions (Signed)
Taught Debi Cousin about self breast awareness and gave educational materials to take home. Patient did need a Pap smear today due to last Pap smear was in 04/28/19 per patient. Let her know [facility]CP will cover Pap smears every 5 years unless has a history of abnormal Pap smears. Referred patient to the [facility] for screening mammogram. Appointment scheduled for 09/18/22. Patient aware of appointment and will be there. Let patient know will follow up with her within the next couple weeks with results. Sway Prokop verbalized understanding.  Pascal Lux, NP 1:38 PM

## 2022-09-18 NOTE — Progress Notes (Addendum)
Ms. Hannah Murray is a 47 y.o. No obstetric history on file. female who presents to [facility] clinic today with no complaints.    Pap Smear: Pap smear completed today. Last Pap smear was 04/28/2019 at Clinic in [place] and was normal. Will send for records. Per patient has no history of an abnormal Pap smear. Last Pap smear result is not available in Epic.   Physical exam: Breasts Patient had right mastectomy with breast implant. Right breast scar has healed with no tenderness or discharge noted at site. No skin abnormalities left breasts. No nipple retraction left breasts. No nipple discharge left breasts. No lymphadenopathy. No lumps palpated left breasts.   MM CLIP PLACEMENT RIGHT  Result Date: 07/08/2021 CLINICAL DATA:  Status post MR guided core biopsy of mass in the UPPER INNER QUADRANT of the RIGHT breast. EXAM: 3D DIAGNOSTIC RIGHT MAMMOGRAM POST MRI BIOPSY COMPARISON:  Previous exam(s). FINDINGS: 3D Mammographic images were obtained following MRI guided biopsy of enhancing mass in the UPPER INNER QUADRANT of the RIGHT breast and placement of a barbell clip. The biopsy marking clip is in expected position at the site of biopsy. In the region of the clip, a 1.9 centimeter hematoma has formed. Pressure dressing was applied. IMPRESSION: Appropriate positioning of the barbell shaped biopsy marking clip at the site of biopsy in the UPPER INNER RIGHT breast. Small hematoma after biopsy. Final Assessment: Post Procedure Mammograms for Marker Placement Electronically Signed   By: Norva Pavlov M.D.   On: 07/08/2021 10:34  MM CLIP PLACEMENT RIGHT  Result Date: 05/16/2021 CLINICAL DATA:  Post ultrasound-guided biopsy a mass in the right breast at the 10 o'clock position and ultrasound-guided biopsy of a mass in the right breast at the 12:30 position. EXAM: 3D DIAGNOSTIC RIGHT MAMMOGRAM POST ULTRASOUND BIOPSY COMPARISON:  Previous exam(s). FINDINGS: 3D Mammographic images were obtained following  ultrasound guided biopsy of a mass in the right breast at the 10 o'clock position as well as ultrasound-guided biopsy of a mass in the right breast the 12:30 position. A ribbon shaped biopsy marking clip is present at the site of the biopsied mass in the right breast the 10 o'clock position. A wing shaped biopsy marking clip is present at the site of the biopsied mass in the right breast at the 12:30 position. IMPRESSION: 1. Ribbon shaped biopsy marking clip at site of biopsied mass in the right breast the 10 o'clock position. 2. Wing shaped biopsy marking clip at site of biopsied mass in the right breast at 12:30 position. Final Assessment: Post Procedure Mammograms for Marker Placement Electronically Signed   By: Edwin Cap M.D.   On: 05/16/2021 08:55  MS DIGITAL DIAG TOMO BILAT  Result Date: 05/06/2021 CLINICAL DATA:  47 year old female presenting for evaluation of a palpable lump in the upper-outer right breast. EXAM: DIGITAL DIAGNOSTIC BILATERAL MAMMOGRAM WITH TOMOSYNTHESIS AND CAD; ULTRASOUND RIGHT BREAST LIMITED TECHNIQUE: Bilateral digital diagnostic mammography and breast tomosynthesis was performed. The images were evaluated with computer-aided detection.; Targeted ultrasound examination of the right breast was performed COMPARISON:  Previous exam(s). ACR Breast Density Category c: The breast tissue is heterogeneously dense, which may obscure small masses. FINDINGS: In the upper-outer quadrant of the right breast in the area of the palpable site there is a broad area of distortion. There is an asymmetry in the central posterior right breast. No other suspicious calcifications, masses or areas of distortion are seen in the bilateral breasts. Ultrasound targeted to the right breast at 10 o'clock, 5  cm from the nipple demonstrates an irregular hypoechoic ill-defined mass measuring at least 3.6 x 2.1 x 3.2 cm. Ultrasound of the right breast at 12:30, 8 cm from the nipple demonstrates a hypoechoic  oval circumscribed mass measuring 1.0 x 0.4 x 1.0 cm. No abnormal lymph nodes are found in the right axilla. IMPRESSION: 1. There is a highly suspicious ill-defined palpable lump in the right breast at 10 o'clock measuring at least 3.6 cm. 2. There is an indeterminate mass in the right breast at 12:30, which may represent a fibroadenoma. 3.  No evidence of right axillary lymphadenopathy. 4.  No evidence of malignancy in the left breast. RECOMMENDATION: 1. Ultrasound guided biopsy is recommended for the right breast mass at 10 o'clock and the right breast mass at 12:30. We will work with the patient to schedule the patient at her earliest convenience. 2. Following biopsy, bilateral breast MRI is recommended to determine the extent of disease given the patient's breast tissue density, age and ill-defined appearance of the suspicious mass making it difficult to accurately measure. I have discussed the findings and recommendations with the patient. If applicable, a reminder letter will be sent to the patient regarding the next appointment. BI-RADS CATEGORY  5: Highly suggestive of malignancy. Electronically Signed   By: Frederico Hamman M.D.   On: 05/06/2021 11:18        Pelvic/Bimanual Ext Genitalia No lesions, no swelling and no discharge observed on external genitalia.        Vagina Vagina pink and normal texture. No lesions or discharge observed in vagina.        Cervix Cervix is present. Cervix pink and of normal texture. Slight blood discharge with exam.     Uterus Uterus is present and palpable. Uterus in normal position and normal size.        Adnexae Bilateral ovaries present and palpable. No tenderness on palpation.         Rectovaginal No rectal exam completed today since patient had no rectal complaints. No skin abnormalities observed on exam.     Smoking History: Patient has never smoked. Not referred to quit line.   Patient Navigation: Patient education provided. Access to  services provided for patient through BCCCP program. Natale [place] interpreter provided. No transportation provided   Colorectal Cancer Screening: Per patient has never had colonoscopy completed. Patient was given Colorectal FIT test to complete and mail. No complaints today.    Breast and Cervical Cancer Risk Assessment: Patient does not have family history of breast cancer, known genetic mutations, or radiation treatment to the chest before age 64. Patient does not have history of cervical dysplasia, immunocompromised, or DES exposure in-utero.  Risk Assessment   No risk assessment data for the current encounter  Risk Scores       04/30/2021   Last edited by: Narda Rutherford, LPN   5-year risk: 1.1%   Lifetime risk: 13.4%            A: BCCCP exam with pap smear. No complaints.  P: Referred patient to the [facility] for a screening mammogram. Appointment scheduled Mobile Unit 09/18/2022 at 130 pm..  Joette Catching, RN 09/18/2022 1:14 PM  Attestation of Supervision of Student:  I confirm that I have verified the information documented in the nurse practitioner student's note and that I have also personally reperformed the history, physical exam and all medical decision making activities.  I have verified that all services and findings are accurately  documented in this student's note; and I agree with management and plan as outlined in the documentation. I have also made any necessary editorial changes.    Pascal Lux, NP [facility] for [employer], [facility] 09/18/2022 1:38 PM

## 2022-09-22 ENCOUNTER — Inpatient Hospital Stay (HOSPITAL_BASED_OUTPATIENT_CLINIC_OR_DEPARTMENT_OTHER): Payer: No Typology Code available for payment source | Admitting: Adult Health

## 2022-09-22 ENCOUNTER — Other Ambulatory Visit: Payer: Self-pay

## 2022-09-22 ENCOUNTER — Encounter: Payer: Self-pay | Admitting: Adult Health

## 2022-09-22 ENCOUNTER — Inpatient Hospital Stay: Payer: Self-pay

## 2022-09-22 VITALS — BP 131/72 | HR 74 | Temp 98.0°F | Resp 16

## 2022-09-22 VITALS — BP 133/69 | HR 81 | Temp 97.7°F | Resp 16 | Wt 150.7 lb

## 2022-09-22 DIAGNOSIS — Z95828 Presence of other vascular implants and grafts: Secondary | ICD-10-CM

## 2022-09-22 DIAGNOSIS — C50411 Malignant neoplasm of upper-outer quadrant of right female breast: Secondary | ICD-10-CM

## 2022-09-22 LAB — CMP (CANCER CENTER ONLY)
ALT: 10 U/L (ref 0–44)
AST: 13 U/L — ABNORMAL LOW (ref 15–41)
Albumin: 4 g/dL (ref 3.5–5.0)
Alkaline Phosphatase: 65 U/L (ref 38–126)
Anion gap: 5 (ref 5–15)
BUN: 13 mg/dL (ref 6–20)
CO2: 27 mmol/L (ref 22–32)
Calcium: 9.1 mg/dL (ref 8.9–10.3)
Chloride: 107 mmol/L (ref 98–111)
Creatinine: 0.76 mg/dL (ref 0.44–1.00)
GFR, Estimated: >60 mL/min (ref >60)
Glucose, Bld: 93 mg/dL (ref 70–99)
Potassium: 4 mmol/L (ref 3.5–5.1)
Sodium: 139 mmol/L (ref 135–145)
Total Bilirubin: 0.4 mg/dL (ref 0.3–1.2)
Total Protein: 6.4 g/dL — ABNORMAL LOW (ref 6.5–8.1)

## 2022-09-22 LAB — CBC WITH DIFFERENTIAL (CANCER CENTER ONLY)
Abs Immature Granulocytes: 0 10*3/uL (ref 0.00–0.07)
Basophils Absolute: 0 10*3/uL (ref 0.0–0.1)
Basophils Relative: 1 %
Eosinophils Absolute: 0.1 10*3/uL (ref 0.0–0.5)
Eosinophils Relative: 3 %
HCT: 36.7 % (ref 36.0–46.0)
Hemoglobin: 12 g/dL (ref 12.0–15.0)
Immature Granulocytes: 0 %
Lymphocytes Relative: 30 %
Lymphs Abs: 1 10*3/uL (ref 0.7–4.0)
MCH: 28 pg (ref 26.0–34.0)
MCHC: 32.7 g/dL (ref 30.0–36.0)
MCV: 85.7 fL (ref 80.0–100.0)
Monocytes Absolute: 0.2 10*3/uL (ref 0.1–1.0)
Monocytes Relative: 7 %
Neutro Abs: 1.9 10*3/uL (ref 1.7–7.7)
Neutrophils Relative %: 59 %
Platelet Count: 175 10*3/uL (ref 150–400)
RBC: 4.28 MIL/uL (ref 3.87–5.11)
RDW: 11.9 % (ref 11.5–15.5)
WBC Count: 3.2 10*3/uL — ABNORMAL LOW (ref 4.0–10.5)
nRBC: 0 % (ref 0.0–0.2)

## 2022-09-22 MED ORDER — SODIUM CHLORIDE 0.9% FLUSH
10.0000 mL | Freq: Once | INTRAVENOUS | Status: AC
  Administered 2022-09-22: 10 mL

## 2022-09-22 MED ORDER — HEPARIN SOD (PORK) LOCK FLUSH 100 UNIT/ML IV SOLN
500.0000 [IU] | Freq: Once | INTRAVENOUS | Status: AC
  Administered 2022-09-22: 500 [IU]

## 2022-09-22 MED ORDER — GOSERELIN ACETATE 3.6 MG ~~LOC~~ IMPL
3.6000 mg | DRUG_IMPLANT | Freq: Once | SUBCUTANEOUS | Status: AC
  Administered 2022-09-22: 3.6 mg via SUBCUTANEOUS
  Filled 2022-09-22: qty 3.6

## 2022-09-22 NOTE — Progress Notes (Signed)
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 Indeterminate mass in the right breast at 12:30, which may represent a fibroadenoma. No evidence of right axillary adenopathy.   05/16/2021 Pathology Results   Right breast 10 0 clock mass biopsy showed invasive mammary carcinoma, ductal phenotype prognostics include ER 95% strong staining intensity PR 95% strong staining intensity Ki-67 of 40% and HER2 negative   06/11/2021 Cancer Staging   Staging form: Breast, AJCC 8th Edition - Pathologic: Stage IIA (pT2, pN1a, cM0, G3, ER+, PR+, HER2-, Oncotype DX score: 24) - Signed by Hannah Moulds, MD on 11/04/2021 Multigene prognostic tests performed: Oncotype DX Recurrence score range: Greater than or equal to 11 Histologic grading system: 3 grade system   07/10/2021 Genetic Testing   Negative hereditary cancer genetic testing: no pathogenic variants detected [facility] Breast STAT Panel or Common Hereditary Cancers +RNA Panel.  Report dates are Jul 10, 2021 and Jul 18, 2021.   The STAT Breast cancer panel offered by [facility] includes sequencing and rearrangement analysis for the following 9 genes:  ATM, BRCA1, BRCA2, CDH1, CHEK2, PALB2, PTEN, STK11 and TP53.   The Common  Hereditary Cancers + RNA Panel offered by [facility] includes sequencing, deletion/duplication, and RNA testing of the following 47 genes: APC, ATM, AXIN2, BARD1, BMPR1A, BRCA1, BRCA2, BRIP1, CDH1, CDK4*, CDKN2A (p14ARF)*, CDKN2A (p16INK4a)*, CHEK2, CTNNA1, DICER1, EPCAM (Deletion/duplication testing only), GREM1 (promoter region deletion/duplication testing only), KIT, MEN1, MLH1, MSH2, MSH3, MSH6, MUTYH, NBN, NF1, NHTL1, PALB2, PDGFRA*, PMS2, POLD1, POLE, PTEN, RAD50, RAD51C, RAD51D, SDHB, SDHC, SDHD, SMAD4, SMARCA4. STK11, TP53, TSC1, TSC2, and VHL.  The following genes were evaluated for sequence changes only: SDHA and HOXB13 c.251G>A variant only.  RNA analysis is not performed for the * genes.     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  Right axillary lymph node evaluation showed 1 out of 8 lymph nodes positive for malignancy., final pathologic staging T2N1A.    11/11/2021 - 03/24/2022 Chemotherapy   Patient is on Treatment Plan : BREAST ADJUVANT DOSE DENSE AC q14d / PACLitaxel q7d     05/01/2022 - 06/16/2022 Radiation Therapy   First Treatment Date: 2022-05-01 - Last Treatment Date: 2022-06-16   Plan Name: CW_R_BO Site: Chest Wall, Right Technique: 3D Mode: Photon Dose Per Fraction: 1.8 Gy Prescribed Dose (Delivered / Prescribed): 25.2 Gy / 25.2 Gy Prescribed Fxs (Delivered / Prescribed): 14 / 14   Plan Name: CW_R_PAB_SCV Site: Chest Wall, Right Technique: 3D Mode: Photon Dose Per Fraction: 1.8 Gy Prescribed Dose (Delivered / Prescribed): 50.4 Gy / 50.4 Gy Prescribed Fxs (Delivered / Prescribed): 28 / 28  Plan Name: CW_R_Bst_BO Site: Chest Wall, Right Technique: Electron Mode: Electron Dose Per Fraction: 2 Gy Prescribed Dose (Delivered / Prescribed): 10 Gy / 10 Gy Prescribed Fxs (Delivered / Prescribed):  5 / 5   Plan Name: CW_R Site: Chest Wall, Right Technique: 3D Mode: Photon Dose Per Fraction: 1.8 Gy Prescribed Dose (Delivered / Prescribed): 25.2 Gy / 25.2 Gy Prescribed Fxs (Delivered / Prescribed): 14 / 14   06/30/2022 -  Anti-estrogen oral therapy   Zoladex injection every 4 weeks, and Anastrozole beginning 06/30/2022 Verzenio BID beginning at the end of May/early June, 2024     CURRENT THERAPY: Verzenio, Anastrozole, Zoladex  INTERVAL HISTORY: Hannah Murray 47 y.o. female returns for follow-up for her history of breast cancer currently on treatment with Zoladex, Verzenio, and anastrozole.  [employer] interpreter via telephone was used for Spanish interpretation.  She receives Zoladex every 4 weeks and is tolerating this well.  She is taking Verzenio 150mg  po BID.  She had diarrhea twice last week, on Monday and Friday x 1.  Otherwise she has been fine.  She notes occasional dizziness that is not significant.  She also reports tired eyes.  She has no vision changes.    She is tolerating the Zoladex and Anastrazole moderately well.  She experiences vaginal dryness.  She has had hot flashes, and occasional arthralgias, not particularly bothersome.  She denies any new concerns.     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  MEDICAL HISTORY: Past Medical History:  Diagnosis Date   Medical history non-contributory     SURGICAL HISTORY: Past Surgical History:  Procedure Laterality Date   BREAST BIOPSY Left 05/16/2021   BREAST BIOPSY Left 07/08/2021   BREAST RECONSTRUCTION WITH PLACEMENT OF TISSUE EXPANDER AND FLEX HD (ACELLULAR HYDRATED DERMIS) Right 09/30/2021   Procedure: BREAST RECONSTRUCTION WITH PLACEMENT OF TISSUE EXPANDER AND FLEX HD (ACELLULAR HYDRATED DERMIS);  Surgeon: Peggye Form, DO;  Location: [facility];  Service: Plastics;  Laterality: Right;   MASTECTOMY Right 09/30/2021   MASTECTOMY W/ SENTINEL NODE BIOPSY Right 09/30/2021   Procedure: RIGHT MASTECTOMY WITH SENTINEL LYMPH NODE BIOPSY;  Surgeon: Griselda Miner, MD;  Location: [facility];  Service: General;  Laterality: Right;   PORTACATH PLACEMENT Left 11/08/2021   Procedure: INSERTION PORT-A-CATH;  Surgeon: Griselda Miner, MD;  Location: [facility];  Service: General;  Laterality: Left;    SOCIAL HISTORY: Social History   Socioeconomic History   Marital status: Single    Spouse name: Not on file   Number of children: 1   Years of education: Not on file   Highest education level: High school graduate  Occupational History   Not on file  Tobacco Use   Smoking status: Never   Smokeless tobacco: Never  Vaping Use   Vaping status: Never Used  Substance and Sexual Activity   Alcohol use: Yes    Comment: occasionally   Drug use: Never   Sexual activity: Not Currently    Birth control/protection: Post-menopausal  Other Topics Concern   Not on file  Social History Narrative   Not on file   Social Determinants of Health   Financial Resource Strain: Not on file  Food Insecurity: No Food Insecurity (09/18/2022)   Hunger Vital Sign    Worried About Running Out  of Food in the Last Year: Never true    Ran Out of Food in the Last Year: Never true  Transportation Needs: No Transportation Needs (09/18/2022)   PRAPARE - Administrator, Civil Service (Medical): No    Lack of Transportation (Non-Medical): No  Physical Activity: Not on file  Stress: Not on file  Social Connections: Not on file  Intimate Partner Violence: Not At Risk (04/10/2022)   Humiliation, Afraid, Rape, and Kick questionnaire    Fear of Current or Ex-Partner: No    Emotionally Abused: No    Physically Abused: No    Sexually Abused: No    FAMILY HISTORY: Family History  Problem Relation Age of Onset    Hypertension Father    Diabetes Father    Thyroid disease Father    Ovarian cancer Other        MGM's niece; d. > 50    Review of Systems  Constitutional:  Negative for appetite change, chills, fatigue, fever and unexpected weight change.  HENT:   Negative for hearing loss, lump/mass, mouth sores, sore throat and trouble swallowing.   Eyes:  Negative for eye problems and icterus.  Respiratory:  Negative for chest tightness, cough and shortness of breath.   Cardiovascular:  Negative for chest pain, leg swelling and palpitations.  Gastrointestinal:  Negative for abdominal distention, abdominal pain, constipation, diarrhea, nausea and vomiting.  Endocrine: Positive for hot flashes.  Genitourinary:  Negative for difficulty urinating.   Musculoskeletal:  Positive for arthralgias.  Skin:  Negative for itching and rash.  Neurological:  Negative for dizziness, extremity weakness, headaches and numbness.  Hematological:  Negative for adenopathy. Does not bruise/bleed easily.  Psychiatric/Behavioral:  Negative for depression. The patient is not nervous/anxious.       PHYSICAL EXAMINATION   Onc Performance Status - 09/22/22 0919       ECOG Perf Status   ECOG Perf Status Restricted in physically strenuous activity but ambulatory and able to carry out work of a light or sedentary nature, e.g., light house work, office work      KPS SCALE   KPS % SCORE Able to carry on normal activity, minor s/s of disease             Vitals:   09/22/22 0903  BP: 133/69  Pulse: 81  Resp: 16  Temp: 97.7 F (36.5 C)  SpO2: 99%    Physical Exam Constitutional:      General: She is not in acute distress.    Appearance: Normal appearance. She is not toxic-appearing.  HENT:     Head: Normocephalic and atraumatic.     Mouth/Throat:     Mouth: Mucous membranes are moist.     Pharynx: Oropharynx is clear. No oropharyngeal exudate or posterior oropharyngeal erythema.  Eyes:     General: No  scleral icterus. Cardiovascular:     Rate and Rhythm: Normal rate and regular rhythm.     Pulses: Normal pulses.     Heart sounds: Normal heart sounds.  Pulmonary:     Effort: Pulmonary effort is normal.     Breath sounds: Normal breath sounds.  Abdominal:     General: Abdomen is flat. Bowel sounds are normal. There is no distension.     Palpations: Abdomen is soft.     Tenderness: There is no abdominal tenderness.  Musculoskeletal:        General: No swelling.     Cervical back: Neck supple.  Lymphadenopathy:  Cervical: No cervical adenopathy.  Skin:    General: Skin is warm and dry.     Findings: No rash.  Neurological:     General: No focal deficit present.     Mental Status: She is alert.  Psychiatric:        Mood and Affect: Mood normal.        Behavior: Behavior normal.     LABORATORY DATA:  CBC    Component Value Date/Time   WBC 3.2 (L) 09/22/2022 0840   WBC 9.3 12/20/2021 0900   RBC 4.28 09/22/2022 0840   HGB 12.0 09/22/2022 0840   HCT 36.7 09/22/2022 0840   PLT 175 09/22/2022 0840   MCV 85.7 09/22/2022 0840   MCH 28.0 09/22/2022 0840   MCHC 32.7 09/22/2022 0840   RDW 11.9 09/22/2022 0840   LYMPHSABS 1.0 09/22/2022 0840   MONOABS 0.2 09/22/2022 0840   EOSABS 0.1 09/22/2022 0840   BASOSABS 0.0 09/22/2022 0840    CMP     Component Value Date/Time   NA 139 09/22/2022 0840   K 4.0 09/22/2022 0840   CL 107 09/22/2022 0840   CO2 27 09/22/2022 0840   GLUCOSE 93 09/22/2022 0840   BUN 13 09/22/2022 0840   CREATININE 0.76 09/22/2022 0840   CALCIUM 9.1 09/22/2022 0840   PROT 6.4 (L) 09/22/2022 0840   ALBUMIN 4.0 09/22/2022 0840   AST 13 (L) 09/22/2022 0840   ALT 10 09/22/2022 0840   ALKPHOS 65 09/22/2022 0840   BILITOT 0.4 09/22/2022 0840   GFRNONAA >60 09/22/2022 0840         ASSESSMENT and THERAPY PLAN:   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   Stage IIA breast cancer: Hannah Murray continues on treatment with Zoladex, Anastrozole and Verzenio with good tolerance.  She has no signs of recurrence today.  CBC and c-Met were reviewed and are within acceptable parameters for her to continue treatment. Diarrhea: occasional and intermittent.  She has imodium to take if needed. Vaginal dryness, arthralgias, and hot flashes: These are mild and tolerable for her.  We will continue to monitor these.  She will return in 2 weeks for labs and follow-up with Jonny Ruiz our pharmacist.  She knows to call for any questions or concerns that may arise between now and her next appointment.    All questions were answered. The patient knows to call the clinic with any problems, questions or concerns. We can certainly see the patient much sooner if necessary.  Total encounter time:30 minutes*in face-to-face visit time, chart review, lab review, care coordination, order entry, and documentation of the encounter time.    Lillard Anes, NP 09/22/22 10:11 AM Medical Oncology and Hematology [facility] [address] [place], [place] [address] Tel. [phone]    Fax. [phone]  *Total Encounter Time as defined by the Centers for Medicare and Medicaid Services includes, in addition to the face-to-face time of a patient visit (documented in the note above) non-face-to-face time: obtaining and reviewing outside history, ordering and reviewing medications, tests or procedures, care coordination (communications with other health care professionals or caregivers) and documentation in the medical record.

## 2022-09-22 NOTE — Patient Instructions (Signed)
Goserelin Implant What is this medication? GOSERELIN (GOE se rel in) treats prostate cancer and breast cancer. It works by decreasing levels of the hormones testosterone and estrogen in the body. This prevents prostate and breast cancer cells from spreading or growing. It may also be used to treat endometriosis. This is a condition where the tissue that lines the uterus grows outside the uterus. It works by decreasing the amount of estrogen your body makes, which reduces heavy bleeding and pain. It can also be used to help thin the lining of the uterus before a surgery used to prevent or reduce heavy periods. This medicine may be used for other purposes; ask your health care provider or pharmacist if you have questions. COMMON BRAND NAME(S): Zoladex, Zoladex 3-Month What should I tell my care team before I take this medication? They need to know if you have any of these conditions: Bone problems Diabetes Heart disease History of irregular heartbeat or rhythm An unusual or allergic reaction to goserelin, other medications, foods, dyes, or preservatives Pregnant or trying to get pregnant Breastfeeding How should I use this medication? This medication is injected under the skin. It is given by your care team in a hospital or clinic setting. Talk to your care team about the use of this medication in children. Special care may be needed. Overdosage: If you think you have taken too much of this medicine contact a poison control center or emergency room at once. NOTE: This medicine is only for you. Do not share this medicine with others. What if I miss a dose? Keep appointments for follow-up doses. It is important not to miss your dose. Call your care team if you are unable to keep an appointment. What may interact with this medication? Do not take this medication with any of the following: Cisapride Dronedarone Pimozide Thioridazine This medication may also interact with the following: Other  medications that cause heart rhythm changes This list may not describe all possible interactions. Give your health care provider a list of all the medicines, herbs, non-prescription drugs, or dietary supplements you use. Also tell them if you smoke, drink alcohol, or use illegal drugs. Some items may interact with your medicine. What should I watch for while using this medication? Visit your care team for regular checks on your progress. Your symptoms may appear to get worse during the first weeks of this therapy. Tell your care team if your symptoms do not start to get better or if they get worse after this time. Using this medication for a long time may weaken your bones. If you smoke or frequently drink alcohol you may increase your risk of bone loss. A family history of osteoporosis, chronic use of medications for seizures (convulsions), or corticosteroids can also increase your risk of bone loss. The risk of bone fractures may be increased. Talk to your care team about your bone health. This medication may increase blood sugar. The risk may be higher in patients who already have diabetes. Ask your care team what you can do to lower your risk of diabetes while taking this medication. This medication should stop regular monthly menstruation in women. Tell your care team if you continue to menstruate. Talk to your care team if you wish to become pregnant or think you might be pregnant. This medication can cause serious birth defects if taken during pregnancy or for 12 weeks after stopping treatment. Talk to your care team about reliable forms of contraception. Do not breastfeed while taking this   medication. This medication may cause infertility. Talk to your care team if you are concerned about your fertility. What side effects may I notice from receiving this medication? Side effects that you should report to your care team as soon as possible: Allergic reactions--skin rash, itching, hives, swelling  of the face, lips, tongue, or throat Change in the amount of urine Heart attack--pain or tightness in the chest, shoulders, arms, or jaw, nausea, shortness of breath, cold or clammy skin, feeling faint or lightheaded Heart rhythm changes--fast or irregular heartbeat, dizziness, feeling faint or lightheaded, chest pain, trouble breathing High blood sugar (hyperglycemia)--increased thirst or amount of urine, unusual weakness or fatigue, blurry vision High calcium level--increased thirst or amount of urine, nausea, vomiting, confusion, unusual weakness or fatigue, bone pain Pain, redness, irritation, or bruising at the injection site Severe back pain, numbness or weakness of the hands, arms, legs, or feet, loss of coordination, loss of bowel or bladder control Stroke--sudden numbness or weakness of the face, arm, or leg, trouble speaking, confusion, trouble walking, loss of balance or coordination, dizziness, severe headache, change in vision Swelling and pain of the tumor site or lymph nodes Trouble passing urine Side effects that usually do not require medical attention (report to your care team if they continue or are bothersome): Change in sex drive or performance Headache Hot flashes Rapid or extreme change in emotion or mood Sweating Swelling of the ankles, hands, or feet Unusual vaginal discharge, itching, or odor This list may not describe all possible side effects. Call your doctor for medical advice about side effects. You may report side effects to FDA at 1-800-FDA-1088. Where should I keep my medication? This medication is given in a hospital or clinic. It will not be stored at home. NOTE: This sheet is a summary. It may not cover all possible information. If you have questions about this medicine, talk to your doctor, pharmacist, or health care provider.  2024 Elsevier/Gold Standard (2021-07-04 00:00:00)  

## 2022-09-22 NOTE — Assessment & Plan Note (Addendum)
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   Stage IIA breast cancer: [name] continues on treatment with Zoladex, Anastrozole and Verzenio with good tolerance.  She has no signs of recurrence today.  CBC and c-Met were reviewed and are within acceptable parameters for her to continue treatment. Diarrhea: occasional and intermittent.  She has imodium to take if needed. Vaginal dryness, arthralgias, and hot flashes: These are mild and tolerable for her.  We will continue to monitor these.  She will return in 2 weeks for labs and follow-up with Jonny Ruiz our pharmacist.  She knows to call for any questions or concerns that may arise between now and her next appointment.

## 2022-10-06 ENCOUNTER — Inpatient Hospital Stay: Payer: No Typology Code available for payment source | Attending: Hematology and Oncology | Admitting: Pharmacist

## 2022-10-06 ENCOUNTER — Inpatient Hospital Stay: Payer: No Typology Code available for payment source

## 2022-10-06 ENCOUNTER — Other Ambulatory Visit: Payer: Self-pay

## 2022-10-06 VITALS — BP 115/58 | HR 94 | Temp 97.9°F | Resp 18 | Ht 65.0 in | Wt 149.4 lb

## 2022-10-06 DIAGNOSIS — Z79811 Long term (current) use of aromatase inhibitors: Secondary | ICD-10-CM | POA: Insufficient documentation

## 2022-10-06 DIAGNOSIS — C50411 Malignant neoplasm of upper-outer quadrant of right female breast: Secondary | ICD-10-CM | POA: Insufficient documentation

## 2022-10-06 DIAGNOSIS — Z452 Encounter for adjustment and management of vascular access device: Secondary | ICD-10-CM | POA: Insufficient documentation

## 2022-10-06 DIAGNOSIS — Z95828 Presence of other vascular implants and grafts: Secondary | ICD-10-CM

## 2022-10-06 DIAGNOSIS — M25519 Pain in unspecified shoulder: Secondary | ICD-10-CM | POA: Insufficient documentation

## 2022-10-06 DIAGNOSIS — R232 Flushing: Secondary | ICD-10-CM | POA: Insufficient documentation

## 2022-10-06 DIAGNOSIS — Z170 Estrogen receptor positive status [ER+]: Secondary | ICD-10-CM | POA: Insufficient documentation

## 2022-10-06 LAB — CBC WITH DIFFERENTIAL (CANCER CENTER ONLY)
Abs Immature Granulocytes: 0 10*3/uL (ref 0.00–0.07)
Basophils Absolute: 0 10*3/uL (ref 0.0–0.1)
Basophils Relative: 1 %
Eosinophils Absolute: 0.1 10*3/uL (ref 0.0–0.5)
Eosinophils Relative: 2 %
HCT: 37.8 % (ref 36.0–46.0)
Hemoglobin: 12.7 g/dL (ref 12.0–15.0)
Immature Granulocytes: 0 %
Lymphocytes Relative: 32 %
Lymphs Abs: 1 10*3/uL (ref 0.7–4.0)
MCH: 28.7 pg (ref 26.0–34.0)
MCHC: 33.6 g/dL (ref 30.0–36.0)
MCV: 85.3 fL (ref 80.0–100.0)
Monocytes Absolute: 0.3 10*3/uL (ref 0.1–1.0)
Monocytes Relative: 8 %
Neutro Abs: 1.9 10*3/uL (ref 1.7–7.7)
Neutrophils Relative %: 57 %
Platelet Count: 245 10*3/uL (ref 150–400)
RBC: 4.43 MIL/uL (ref 3.87–5.11)
RDW: 12.3 % (ref 11.5–15.5)
WBC Count: 3.3 10*3/uL — ABNORMAL LOW (ref 4.0–10.5)
nRBC: 0 % (ref 0.0–0.2)

## 2022-10-06 LAB — CMP (CANCER CENTER ONLY)
ALT: 11 U/L (ref 0–44)
AST: 14 U/L — ABNORMAL LOW (ref 15–41)
Albumin: 4.2 g/dL (ref 3.5–5.0)
Alkaline Phosphatase: 56 U/L (ref 38–126)
Anion gap: 6 (ref 5–15)
BUN: 13 mg/dL (ref 6–20)
CO2: 28 mmol/L (ref 22–32)
Calcium: 9 mg/dL (ref 8.9–10.3)
Chloride: 106 mmol/L (ref 98–111)
Creatinine: 0.8 mg/dL (ref 0.44–1.00)
GFR, Estimated: >60 mL/min (ref >60)
Glucose, Bld: 96 mg/dL (ref 70–99)
Potassium: 4.1 mmol/L (ref 3.5–5.1)
Sodium: 140 mmol/L (ref 135–145)
Total Bilirubin: 0.3 mg/dL (ref 0.3–1.2)
Total Protein: 7.2 g/dL (ref 6.5–8.1)

## 2022-10-06 MED ORDER — SODIUM CHLORIDE 0.9% FLUSH
10.0000 mL | Freq: Once | INTRAVENOUS | Status: AC
  Administered 2022-10-06: 10 mL

## 2022-10-06 MED ORDER — HEPARIN SOD (PORK) LOCK FLUSH 100 UNIT/ML IV SOLN
500.0000 [IU] | Freq: Once | INTRAVENOUS | Status: AC
  Administered 2022-10-06: 500 [IU]

## 2022-10-06 NOTE — Progress Notes (Signed)
[facility]       Telephone: [phone]?Fax: [phone]   Oncology Clinical Pharmacist Practitioner Progress Note  Hannah Murray was contacted via in-person to discuss her chemotherapy regimen for abemaciclib which they receive under the care of Dr. [name]. She is accompanied by Spanish interpreter Delorise Royals.   Current treatment regimen and start date Abemaciclib (08/25/22) Anastrozole (06/16/22) Goserelin (06/30/22)   Interval History She continues on abemaciclib 150 mg by mouth every 12 hours on days 1 to 28 of a 28-day cycle. This is being given in combination with anastrozole and goserelin . Therapy is planned to continue until two years in the adjuvant setting per the [facility] trial data. Hannah Murray was seen today for a follow up visit regarding her abemaciclib management. She last saw clinical pharmacy on 09/08/22 and Dr. Al Pimple on 06/16/22. She also saw nurse practitioner Lillard Anes on 09/22/22. She is do for goserelin again on 10/21/22 which she receives every 4 weeks. She will also see Dr. Al Pimple at this time with labs.  Response to Therapy Hannah Murray is doing well.  She is reporting mild nausea which she states she is using a nasal product that she purchased on [employer].  She says it helps very well.  We asked that she bring it in next time so we can added to her medication list.  She is also experiencing very little GI discomfort.  She is not needing to use loperamide at this time but does have it on hand.  We did ask today if she would prefer to keep reported for the time being and continue to get labs through her port and she said that she does prefer this.  We discussed that if she does want to have the port removed in the future, she should let Dr. Al Pimple know so she can schedule it with the surgeon.  She also said that she might prefer to have a bilateral oophorectomy at some point because she would possibly like to stop taking the goserelin.  We said that  this procedure could be done and that she should just let Dr. Al Pimple know when she sees her next so that it can be coordinated with GYN.  Hannah Murray verbalized understanding of the plan and was in agreement.  Since he is on anastrozole, Hannah Murray was inquiring about her risk for osteoporosis.  We discussed that we could get a DEXA scan that would monitor this and she was in agreement.  We will add this to be done sometime in the next month.  As above, she will see Dr. Al Pimple with labs in 2 weeks when she is next due for goserelin.  Clinical pharmacy will then see her 4 weeks after that (6 weeks from today).  Hannah Murray prefers Tuesday appointments since she is going back to work.  We told her that we will let scheduling know.. Labs, vitals, treatment parameters, and manufacturer guidelines assessing toxicity were reviewed with Hannah Murray today. Based on these values, patient is in agreement to continue abemaciclib therapy at this time.  Allergies No Known Allergies  Vitals    10/06/2022    9:04 AM 09/22/2022    9:03 AM 09/22/2022    8:43 AM  Oncology Vitals  Height 165 cm    Weight 67.767 kg 68.357 kg   Weight (lbs) 149 lbs 6 oz 150 lbs 11 oz   BMI 24.86 kg/m2 25.08 kg/m2   Temp 97.9 F (36.6 C) 97.7 F (36.5  C) 98 F (36.7 C)  Pulse Rate 94 81 74  BP 115/58 133/69 131/72  Resp 18 16 16   SpO2 100 % 99 % 100 %  BSA (m2) 1.76 m2 1.77 m2     Laboratory Data    Latest Ref Rng & Units 10/06/2022    8:49 AM 09/22/2022    8:40 AM 09/08/2022    8:22 AM  CBC EXTENDED  WBC 4.0 - 10.5 K/uL 3.3  3.2  3.8   RBC 3.87 - 5.11 MIL/uL 4.43  4.28  4.56   Hemoglobin 12.0 - 15.0 g/dL 38.7  56.4  33.2   HCT 36.0 - 46.0 % 37.8  36.7  38.9   Platelets 150 - 400 K/uL 245  175  209   NEUT# 1.7 - 7.7 K/uL 1.9  1.9  2.4   Lymph# 0.7 - 4.0 K/uL 1.0  1.0  1.0        Latest Ref Rng & Units 10/06/2022    8:49 AM 09/22/2022    8:40 AM 09/08/2022    8:22 AM  CMP  Glucose 70 - 99 mg/dL 96  93  85    BUN 6 - 20 mg/dL 13  13  14    Creatinine 0.44 - 1.00 mg/dL 9.51  8.84  1.66   Sodium 135 - 145 mmol/L 140  139  137   Potassium 3.5 - 5.1 mmol/L 4.1  4.0  3.8   Chloride 98 - 111 mmol/L 106  107  104   CO2 22 - 32 mmol/L 28  27  28    Calcium 8.9 - 10.3 mg/dL 9.0  9.1  9.5   Total Protein 6.5 - 8.1 g/dL 7.2  6.4  7.0   Total Bilirubin 0.3 - 1.2 mg/dL 0.3  0.4  0.6   Alkaline Phos 38 - 126 U/L 56  65  54   AST 15 - 41 U/L 14  13  13    ALT 0 - 44 U/L 11  10  11      Lab Results  Component Value Date   MG 2.0 12/20/2021   No results found for: "CA2729"   Adverse Effects Assessment Nausea: very mild. Not using any prescriptions. Has OTC product that she sniffs. She will bring in next time so we can add to her medication list  Adherence Assessment Danika Pajak reports missing 0 doses over the past 2 weeks.   Reason for missed dose: 0 Patient was re-educated on importance of adherence.   Access Assessment Hannah Murray is currently receiving her abemaciclib through [employer] concerns:  none  Medication Reconciliation The patient's medication list was reviewed today with the patient?  Yes  New medications or herbal supplements have recently been started? Yes , she is using an OTC nausea product.  She said that she would bring it in so we can added to her med list next time.  She said she purchased this from [employer]. Any medications have been discontinued?  No The medication list was updated and reconciled based on the patient's most recent medication list in the electronic medical record (EMR) including herbal products and OTC medications.   Medications Current Outpatient Medications  Medication Sig Dispense Refill   abemaciclib (VERZENIO) 150 MG tablet Take 1 tablet (150 mg total) by mouth 2 (two) times daily. 56 tablet 2   anastrozole (ARIMIDEX) 1 MG tablet Take 1 tablet (1 mg total) by mouth daily. 90 tablet 3   loperamide (IMODIUM A-D) 2  MG tablet  Take 2 mg by mouth as needed for diarrhea or loose stools. Take 2 tabs (4 mg) with first loose stool, then 1 tab (2 mg) with each additional loose stool. Do not take more than 8 tabs (16 mg) in a 24-hour period. (Patient not taking: Reported on 10/06/2022)     No current facility-administered medications for this visit.    Drug-Drug Interactions (DDIs) DDIs were evaluated?  Yes Significant DDIs?  No The patient was instructed to speak with their health care provider and/or the oral chemotherapy pharmacist before starting any new drug, including prescription or over the counter, natural / herbal products, or vitamins.  Supportive Care Diarrhea: we reviewed that diarrhea is common with abemaciclib and confirmed that she does have loperamide (Imodium) at home.  We reviewed how to take this medication PRN. Neutropenia: we discussed the importance of having a thermometer and what the Centers for Disease Control and Prevention (CDC) considers a fever which is 100.77F (38C) or higher.  Gave patient 24/7 triage line to call if any fevers or symptoms. ILD/Pneumonitis: we reviewed potential symptoms including cough, shortness, and fatigue.  VTE: reviewed signs of DVT such as leg swelling, redness, pain, or tenderness and signs of PE such as shortness of breath, rapid or irregular heartbeat, cough, chest pain, or lightheadedness. Reviewed to take the medication every 12 hours (with food sometimes can be easier on the stomach) and to take it at the same time every day. Hepatotoxicity: WNL Drug interactions with grapefruit products  Dosing Assessment Hepatic adjustments needed?  No Renal adjustments needed?  No Toxicity adjustments needed?  No The current dosing regimen is appropriate to continue at this time.  Follow-Up Plan Continue abemaciclib 150 mg by mouth every 12 hours.  Continue anastrozole 1 mg by mouth daily Continue goserelin 3.5 mg subcutaneously every 28 days. Next due on 10/21/22. Will  see Dr. Al Pimple with labs at this time Consider OTC pain relievers (acetaminophen or ibuprofen or naproxen) for joint pain and headaches. These started soon after starting abemaciclib but likely due to anastrozole She will bring in OTC product for nausea. Something she sniffs that she bought from [employer]. This should be added to her med list. Port labs, Dr. Al Pimple visit, and goserelin on 10/21/22. She prefers to keep port in for now Will add port labs, pharmacy clinic visit in six weeks (11/18/22) She prefers Tuesday appointments due to work schedule.  After her October visit, she can likely be seen periodically or as clinically indicated per manufacturer guidelines from abemaciclib. She will consider BSO with Dr. Al Pimple at a later time. If BSO is done, she can stop goserelin.  Hannah Murray participated in the discussion, expressed understanding, and voiced agreement with the above plan. All questions were answered to her satisfaction. The patient was advised to contact the clinic at [phone] with any questions or concerns prior to her return visit.   I spent 30 minutes assessing and educating the patient.   A. Odetta Pink, PharmD, BCOP, CPP  Anselm Lis, RPH-CPP, 10/06/2022  9:42 AM   **Disclaimer: This note was dictated with voice recognition software. Similar sounding words can inadvertently be transcribed and this note may contain transcription errors which may not have been corrected upon publication of note.**

## 2022-10-15 ENCOUNTER — Inpatient Hospital Stay (HOSPITAL_BASED_OUTPATIENT_CLINIC_OR_DEPARTMENT_OTHER): Payer: Self-pay | Admitting: Adult Health

## 2022-10-15 VITALS — BP 125/62 | HR 87 | Temp 97.7°F | Resp 18 | Ht 65.0 in | Wt 148.4 lb

## 2022-10-15 DIAGNOSIS — Z79811 Long term (current) use of aromatase inhibitors: Secondary | ICD-10-CM

## 2022-10-15 DIAGNOSIS — C50411 Malignant neoplasm of upper-outer quadrant of right female breast: Secondary | ICD-10-CM

## 2022-10-15 NOTE — Progress Notes (Signed)
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 Indeterminate mass in the right breast at 12:30, which may represent a fibroadenoma. No evidence of right axillary adenopathy.   05/16/2021 Pathology Results   Right breast 10 0 clock mass biopsy showed invasive mammary carcinoma, ductal phenotype prognostics include ER 95% strong staining intensity PR 95% strong staining intensity Ki-67 of 40% and HER2 negative   06/11/2021 Cancer Staging   Staging form: Breast, AJCC 8th Edition - Pathologic: Stage IIA (pT2, pN1a, cM0, G3, ER+, PR+, HER2-, Oncotype DX score: 24) - Signed by [name], MD on 11/04/2021 Multigene prognostic tests performed: Oncotype DX Recurrence score range: Greater than or equal to 11 Histologic grading system: 3 grade system   07/10/2021 Genetic Testing   Negative hereditary cancer genetic testing: no pathogenic variants detected [facility] Breast STAT Panel or Common Hereditary Cancers +RNA Panel.  Report dates are Jul 10, 2021 and Jul 18, 2021.   The STAT Breast cancer panel offered by [facility] includes sequencing and rearrangement analysis for the following 9 genes:  ATM, BRCA1, BRCA2, CDH1, CHEK2, PALB2, PTEN, STK11 and TP53.   The Common Hereditary Cancers + RNA Panel offered by [facility] includes sequencing, deletion/duplication, and RNA testing of the following 47 genes: APC, ATM, AXIN2, BARD1, BMPR1A, BRCA1, BRCA2, BRIP1, CDH1, CDK4*, CDKN2A (p14ARF)*, CDKN2A (p16INK4a)*, CHEK2, CTNNA1, DICER1, EPCAM (Deletion/duplication testing only), GREM1 (promoter region deletion/duplication testing only), KIT, MEN1, MLH1, MSH2, MSH3, MSH6, MUTYH, NBN, NF1, NHTL1, PALB2, PDGFRA*, PMS2, POLD1, POLE, PTEN, RAD50, RAD51C, RAD51D, SDHB, SDHC, SDHD, SMAD4, SMARCA4.  STK11, TP53, TSC1, TSC2, and VHL.  The following genes were evaluated for sequence changes only: SDHA and HOXB13 c.251G>A variant only.  RNA analysis is not performed for the * genes.     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  Right axillary lymph node evaluation showed 1 out of 8 lymph nodes positive for malignancy., final pathologic staging T2N1A.    11/11/2021 - 03/24/2022 Chemotherapy   Patient is on Treatment Plan : BREAST ADJUVANT DOSE DENSE AC q14d / PACLitaxel q7d     05/01/2022 - 06/16/2022 Radiation Therapy   First Treatment Date: 2022-05-01 - Last Treatment Date: 2022-06-16   Plan Name: CW_R_BO Site: Chest Wall, Right Technique: 3D Mode: Photon Dose Per Fraction: 1.8 Gy Prescribed Dose (Delivered / Prescribed): 25.2 Gy / 25.2 Gy Prescribed Fxs (Delivered / Prescribed): 14 / 14   Plan Name: CW_R_PAB_SCV Site: Chest Wall, Right Technique: 3D Mode: Photon Dose Per Fraction: 1.8 Gy Prescribed Dose (Delivered / Prescribed): 50.4 Gy / 50.4 Gy Prescribed Fxs (Delivered / Prescribed): 28 / 28   Plan Name: CW_R_Bst_BO Site: Chest Wall, Right Technique: Electron Mode: Electron Dose Per Fraction: 2 Gy Prescribed Dose (Delivered / Prescribed): 10 Gy / 10 Gy Prescribed Fxs (Delivered / Prescribed): 5 / 5   Plan Name: CW_R Site: Chest Wall, Right Technique: 3D Mode: Photon Dose Per Fraction: 1.8 Gy Prescribed Dose (Delivered / Prescribed): 25.2 Gy / 25.2 Gy Prescribed Fxs (Delivered / Prescribed): 14 / 14   06/30/2022 -  Anti-estrogen oral therapy   Zoladex injection every 4 weeks,  and Anastrozole beginning 06/30/2022 Verzenio BID beginning at the end of May/early June, 2024     INTERVAL HISTORY:  Ms. Chism to review her survivorship care plan detailing her treatment course for breast cancer, as  well as monitoring long-term side effects of that treatment, education regarding health maintenance, screening, and overall wellness and health promotion.     Overall, Ms. Neef reports feeling quite well.  She is taking Verzenio BID and denies diarrhea, she is taking anastrozole daily.  She endorses hot flashes and vaginal dryness.  She also has some arthralgias in her shoulders.    REVIEW OF SYSTEMS:  Review of Systems  Constitutional:  Negative for appetite change, chills, fatigue, fever and unexpected weight change.  HENT:   Negative for hearing loss, lump/mass and trouble swallowing.   Eyes:  Negative for eye problems and icterus.  Respiratory:  Negative for chest tightness, cough and shortness of breath.   Cardiovascular:  Negative for chest pain, leg swelling and palpitations.  Gastrointestinal:  Negative for abdominal distention, abdominal pain, constipation, diarrhea, nausea and vomiting.  Endocrine: Positive for hot flashes.  Genitourinary:  Negative for difficulty urinating.   Musculoskeletal:  Negative for arthralgias.  Skin:  Negative for itching and rash.  Neurological:  Negative for dizziness, extremity weakness, headaches and numbness.  Hematological:  Negative for adenopathy. Does not bruise/bleed easily.  Psychiatric/Behavioral:  Negative for depression. The patient is not nervous/anxious.   Breast: Denies any new nodularity, masses, tenderness, nipple changes, or nipple discharge.       PAST MEDICAL/SURGICAL HISTORY:  Past Medical History:  Diagnosis Date   Medical history non-contributory    Past Surgical History:  Procedure Laterality Date   BREAST BIOPSY Left 05/16/2021   BREAST BIOPSY Left 07/08/2021   BREAST RECONSTRUCTION WITH PLACEMENT OF TISSUE EXPANDER AND FLEX HD (ACELLULAR HYDRATED DERMIS) Right 09/30/2021   Procedure: BREAST RECONSTRUCTION WITH PLACEMENT OF TISSUE EXPANDER AND FLEX HD (ACELLULAR HYDRATED DERMIS);  Surgeon: Peggye Form, DO;   Location: [facility];  Service: Plastics;  Laterality: Right;   MASTECTOMY Right 09/30/2021   MASTECTOMY W/ SENTINEL NODE BIOPSY Right 09/30/2021   Procedure: RIGHT MASTECTOMY WITH SENTINEL LYMPH NODE BIOPSY;  Surgeon: Griselda Miner, MD;  Location: [facility];  Service: General;  Laterality: Right;   PORTACATH PLACEMENT Left 11/08/2021   Procedure: INSERTION PORT-A-CATH;  Surgeon: Griselda Miner, MD;  Location: [facility];  Service: General;  Laterality: Left;     ALLERGIES:  No Known Allergies   CURRENT MEDICATIONS:  Outpatient Encounter Medications as of 10/15/2022  Medication Sig   abemaciclib (VERZENIO) 150 MG tablet Take 1 tablet (150 mg total) by mouth 2 (two) times daily.   anastrozole (ARIMIDEX) 1 MG tablet Take 1 tablet (1 mg total) by mouth daily.   loperamide (IMODIUM A-D) 2 MG tablet Take 2 mg by mouth as needed for diarrhea or loose stools. Take 2 tabs (4 mg) with first loose stool, then 1 tab (2 mg) with each additional loose stool. Do not take more than 8 tabs (16 mg) in a 24-hour period. (Patient not taking: Reported on 10/06/2022)   No facility-administered encounter medications on file as of 10/15/2022.     ONCOLOGIC FAMILY HISTORY:  Family History  Problem Relation Age of Onset   Hypertension Father    Diabetes Father    Thyroid disease Father    Ovarian cancer Other        MGM's niece; d. > 50  SOCIAL HISTORY:  Social History   Socioeconomic History   Marital status: Single    Spouse name: Not on file   Number of children: 1   Years of education: Not on file   Highest education level: High school graduate  Occupational History   Not on file  Tobacco Use   Smoking status: Never   Smokeless tobacco: Never  Vaping Use   Vaping status: Never Used  Substance and Sexual Activity   Alcohol use: Yes    Comment: occasionally   Drug use: Never   Sexual activity: Not Currently    Birth control/protection:  Post-menopausal  Other Topics Concern   Not on file  Social History Narrative   Not on file   Social Determinants of Health   Financial Resource Strain: Not on file  Food Insecurity: No Food Insecurity (09/18/2022)   Hunger Vital Sign    Worried About Running Out of Food in the Last Year: Never true    Ran Out of Food in the Last Year: Never true  Transportation Needs: No Transportation Needs (09/18/2022)   PRAPARE - Administrator, Civil Service (Medical): No    Lack of Transportation (Non-Medical): No  Physical Activity: Not on file  Stress: Not on file  Social Connections: Not on file  Intimate Partner Violence: Not At Risk (04/10/2022)   Humiliation, Afraid, Rape, and Kick questionnaire    Fear of Current or Ex-Partner: No    Emotionally Abused: No    Physically Abused: No    Sexually Abused: No     OBSERVATIONS/OBJECTIVE:  BP 125/62 (BP Location: Left Arm, Patient Position: Sitting)   Pulse 87   Temp 97.7 F (36.5 C) (Tympanic)   Resp 18   Ht 5\' 5"  (1.651 m)   Wt 148 lb 6.4 oz (67.3 kg)   SpO2 100%   BMI 24.70 kg/m  GENERAL: Patient is a well appearing female in no acute distress HEENT:  Sclerae anicteric.  Oropharynx clear and moist. No ulcerations or evidence of oropharyngeal candidiasis. Neck is supple.  NODES:  No cervical, supraclavicular, or axillary lymphadenopathy palpated.  BREAST EXAM: Right breast status postmastectomy and radiation no sign of local recurrence left breast is benign LUNGS:  Clear to auscultation bilaterally.  No wheezes or rhonchi. HEART:  Regular rate and rhythm. No murmur appreciated. ABDOMEN:  Soft, nontender.  Positive, normoactive bowel sounds. No organomegaly palpated. MSK:  No focal spinal tenderness to palpation. Full range of motion bilaterally in the upper extremities. EXTREMITIES:  No peripheral edema.   SKIN:  Clear with no obvious rashes or skin changes. No nail dyscrasia. NEURO:  Nonfocal. Well oriented.   Appropriate affect.   LABORATORY DATA:  None for this visit.  DIAGNOSTIC IMAGING:  None for this visit.      ASSESSMENT AND PLAN:  Ms.. Tout is a pleasant 47 y.o. 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  She presents to the Survivorship Clinic for our initial meeting and routine follow-up post-completion of treatment for breast cancer.    1. Stage 2A right breast cancer:  Ms. Biffle is continuing to recover from definitive treatment for breast cancer. She will follow-up with her medical oncologist, Dr.  Al Pimple 10/2022 with history and physical exam per surveillance protocol.  She will continue her treatment with anastrozole, Zoladex, and Verzenio.  Her  most recent left breast screening mammogram occurred in July 2024.  Repeat is recommended to occur in July 2025.  Today, a comprehensive survivorship care plan and treatment summary was reviewed with the patient today detailing her breast cancer diagnosis, treatment course, potential late/long-term effects of treatment, appropriate follow-up care with recommendations for the future, and patient education resources.  A copy of this summary, along with a letter will be sent to the patient's primary care provider via mail/fax/In Basket message after today's visit.    2. Bone health:  Given Ms. Shoun's age/history of breast cancer and her current treatment regimen including anti-estrogen therapy with anastrozole, she is at risk for bone demineralization.  She has not undergone bone density testing and I placed orders at today's visit.  She was given education on specific activities to promote bone health.  3. Cancer screening:  Due to Ms. Meisinger's history and her age, she should receive screening for skin cancers, colon  cancer, and gynecologic cancers.  She is connected with [facility] and underwent a well woman visit with Shambria Basset earlier this month. The information and recommendations are listed on the patient's comprehensive care plan/treatment summary and were reviewed in detail with the patient.  I also recommended she get a PCP and referred her to [facility] and wellness.    4. Health maintenance and wellness promotion: Ms. Trevithick was encouraged to consume 5-7 servings of fruits and vegetables per day. We reviewed the "Nutrition Rainbow" handout.  She was also encouraged to engage in moderate to vigorous exercise for 30 minutes per day most days of the week.  She was instructed to limit her alcohol consumption and continue to abstain from tobacco use.     5. Support services/counseling: It is not uncommon for this period of the patient's cancer care trajectory to be one of many emotions and stressors.   She was given information regarding our available services and encouraged to contact me with any questions or for help enrolling in any of our support group/programs.    Follow up instructions:    -Return to cancer center every 4 weeks for Zoladex, in 8 weeks for f/u with Dr. Al Pimple  -Mammogram due in 08/2023 -Bone density due -Referral to primary care.  -She is welcome to return back to the Survivorship Clinic at any time; no additional follow-up needed at this time.  -Consider referral back to survivorship as a long-term survivor for continued surveillance  The patient was provided an opportunity to ask questions and all were answered. The patient agreed with the plan and demonstrated an understanding of the instructions.   Total encounter time:60 minutes*in face-to-face visit time, chart review, lab review, care coordination, order entry, and documentation of the encounter time.  Lillard Anes, NP 10/15/22 11:36 AM Medical Oncology and Hematology [facility] [address]  [place], [place] [address] Tel. [phone]    Fax. [phone]  *Total Encounter Time as defined by the Centers for Medicare and Medicaid Services includes, in addition to the face-to-face time of a patient visit (documented in the note above) non-face-to-face time: obtaining and reviewing outside history, ordering and reviewing medications, tests or procedures, care coordination (communications with other health care professionals or caregivers) and documentation in the medical record.

## 2022-10-20 ENCOUNTER — Encounter: Payer: Self-pay | Admitting: Hematology and Oncology

## 2022-10-21 ENCOUNTER — Inpatient Hospital Stay: Payer: Self-pay

## 2022-10-21 ENCOUNTER — Ambulatory Visit: Payer: Self-pay | Admitting: Hematology and Oncology

## 2022-10-21 VITALS — BP 118/84 | HR 77 | Temp 98.2°F | Resp 20

## 2022-10-21 DIAGNOSIS — C50411 Malignant neoplasm of upper-outer quadrant of right female breast: Secondary | ICD-10-CM

## 2022-10-21 DIAGNOSIS — Z95828 Presence of other vascular implants and grafts: Secondary | ICD-10-CM

## 2022-10-21 LAB — CBC WITH DIFFERENTIAL (CANCER CENTER ONLY)
Abs Immature Granulocytes: 0 10*3/uL (ref 0.00–0.07)
Basophils Absolute: 0 10*3/uL (ref 0.0–0.1)
Basophils Relative: 1 %
Eosinophils Absolute: 0.1 10*3/uL (ref 0.0–0.5)
Eosinophils Relative: 2 %
HCT: 35.5 % — ABNORMAL LOW (ref 36.0–46.0)
Hemoglobin: 12.2 g/dL (ref 12.0–15.0)
Immature Granulocytes: 0 %
Lymphocytes Relative: 25 %
Lymphs Abs: 1.2 10*3/uL (ref 0.7–4.0)
MCH: 29.3 pg (ref 26.0–34.0)
MCHC: 34.4 g/dL (ref 30.0–36.0)
MCV: 85.3 fL (ref 80.0–100.0)
Monocytes Absolute: 0.3 10*3/uL (ref 0.1–1.0)
Monocytes Relative: 7 %
Neutro Abs: 3.1 10*3/uL (ref 1.7–7.7)
Neutrophils Relative %: 65 %
Platelet Count: 229 10*3/uL (ref 150–400)
RBC: 4.16 MIL/uL (ref 3.87–5.11)
RDW: 12.5 % (ref 11.5–15.5)
WBC Count: 4.7 10*3/uL (ref 4.0–10.5)
nRBC: 0 % (ref 0.0–0.2)

## 2022-10-21 LAB — CMP (CANCER CENTER ONLY)
ALT: 11 U/L (ref 0–44)
AST: 14 U/L — ABNORMAL LOW (ref 15–41)
Albumin: 4.3 g/dL (ref 3.5–5.0)
Alkaline Phosphatase: 68 U/L (ref 38–126)
Anion gap: 6 (ref 5–15)
BUN: 18 mg/dL (ref 6–20)
CO2: 27 mmol/L (ref 22–32)
Calcium: 9.3 mg/dL (ref 8.9–10.3)
Chloride: 105 mmol/L (ref 98–111)
Creatinine: 0.85 mg/dL (ref 0.44–1.00)
GFR, Estimated: >60 mL/min (ref >60)
Glucose, Bld: 93 mg/dL (ref 70–99)
Potassium: 3.7 mmol/L (ref 3.5–5.1)
Sodium: 138 mmol/L (ref 135–145)
Total Bilirubin: 0.4 mg/dL (ref 0.3–1.2)
Total Protein: 7.1 g/dL (ref 6.5–8.1)

## 2022-10-21 MED ORDER — GOSERELIN ACETATE 3.6 MG ~~LOC~~ IMPL: 3.6000 mg | DRUG_IMPLANT | Freq: Once | SUBCUTANEOUS | Status: DC

## 2022-10-21 MED ORDER — SODIUM CHLORIDE 0.9% FLUSH
10.0000 mL | Freq: Once | INTRAVENOUS | Status: AC
  Administered 2022-10-21: 10 mL

## 2022-10-21 MED ORDER — GOSERELIN ACETATE 3.6 MG ~~LOC~~ IMPL
3.6000 mg | DRUG_IMPLANT | Freq: Once | SUBCUTANEOUS | Status: AC
  Administered 2022-10-21: 3.6 mg via SUBCUTANEOUS
  Filled 2022-10-21: qty 3.6

## 2022-10-21 MED ORDER — HEPARIN SOD (PORK) LOCK FLUSH 100 UNIT/ML IV SOLN
500.0000 [IU] | Freq: Once | INTRAVENOUS | Status: AC
  Administered 2022-10-21: 500 [IU]

## 2022-10-21 NOTE — Progress Notes (Signed)
Pt. Here for port flush/lab and Zoladex injection.  All the above done and charted in first appointment.  Not able to close the chart/complete the day for the injection appointment for this reason.  Released another Zoladex and charted not given, in order to be able to close the chart.  Colleen/pharmacist secured chatted me about the second release of Zoladex.  Informed her of the above reason for releasing the second Zoladex.

## 2022-10-21 NOTE — Addendum Note (Signed)
Addended by: Elwanda Brooklyn on: 10/21/2022 12:59 PM   Modules accepted: Orders

## 2022-11-03 ENCOUNTER — Ambulatory Visit (HOSPITAL_BASED_OUTPATIENT_CLINIC_OR_DEPARTMENT_OTHER)
Admission: RE | Admit: 2022-11-03 | Discharge: 2022-11-03 | Disposition: A | Discharge status: Home / Self Care | Payer: Self-pay | Source: Ambulatory Visit | Attending: Hematology and Oncology | Admitting: Hematology and Oncology

## 2022-11-03 DIAGNOSIS — Z79811 Long term (current) use of aromatase inhibitors: Secondary | ICD-10-CM | POA: Insufficient documentation

## 2022-11-17 ENCOUNTER — Encounter: Payer: Self-pay | Admitting: *Deleted

## 2022-11-20 ENCOUNTER — Encounter: Payer: Self-pay | Admitting: Hematology and Oncology

## 2022-11-20 ENCOUNTER — Inpatient Hospital Stay: Payer: Self-pay | Attending: Hematology and Oncology | Admitting: Hematology and Oncology

## 2022-11-20 ENCOUNTER — Inpatient Hospital Stay: Payer: Self-pay

## 2022-11-20 ENCOUNTER — Inpatient Hospital Stay: Payer: No Typology Code available for payment source

## 2022-11-20 ENCOUNTER — Other Ambulatory Visit: Payer: Self-pay | Admitting: Hematology and Oncology

## 2022-11-20 VITALS — BP 123/66 | HR 79 | Temp 97.0°F | Resp 18 | Wt 147.3 lb

## 2022-11-20 DIAGNOSIS — M8580 Other specified disorders of bone density and structure, unspecified site: Secondary | ICD-10-CM | POA: Insufficient documentation

## 2022-11-20 DIAGNOSIS — Z452 Encounter for adjustment and management of vascular access device: Secondary | ICD-10-CM | POA: Insufficient documentation

## 2022-11-20 DIAGNOSIS — Z95828 Presence of other vascular implants and grafts: Secondary | ICD-10-CM

## 2022-11-20 DIAGNOSIS — Z79811 Long term (current) use of aromatase inhibitors: Secondary | ICD-10-CM | POA: Insufficient documentation

## 2022-11-20 DIAGNOSIS — C50412 Malignant neoplasm of upper-outer quadrant of left female breast: Secondary | ICD-10-CM | POA: Insufficient documentation

## 2022-11-20 DIAGNOSIS — C50411 Malignant neoplasm of upper-outer quadrant of right female breast: Secondary | ICD-10-CM | POA: Insufficient documentation

## 2022-11-20 DIAGNOSIS — Z5111 Encounter for antineoplastic chemotherapy: Secondary | ICD-10-CM | POA: Insufficient documentation

## 2022-11-20 DIAGNOSIS — Z170 Estrogen receptor positive status [ER+]: Secondary | ICD-10-CM | POA: Insufficient documentation

## 2022-11-20 LAB — CBC WITH DIFFERENTIAL (CANCER CENTER ONLY)
Abs Immature Granulocytes: 0.01 10*3/uL (ref 0.00–0.07)
Basophils Absolute: 0 10*3/uL (ref 0.0–0.1)
Basophils Relative: 1 %
Eosinophils Absolute: 0.1 10*3/uL (ref 0.0–0.5)
Eosinophils Relative: 3 %
HCT: 38 % (ref 36.0–46.0)
Hemoglobin: 12.4 g/dL (ref 12.0–15.0)
Immature Granulocytes: 0 %
Lymphocytes Relative: 32 %
Lymphs Abs: 1.1 10*3/uL (ref 0.7–4.0)
MCH: 28.3 pg (ref 26.0–34.0)
MCHC: 32.6 g/dL (ref 30.0–36.0)
MCV: 86.8 fL (ref 80.0–100.0)
Monocytes Absolute: 0.2 10*3/uL (ref 0.1–1.0)
Monocytes Relative: 7 %
Neutro Abs: 1.9 10*3/uL (ref 1.7–7.7)
Neutrophils Relative %: 57 %
Platelet Count: 235 10*3/uL (ref 150–400)
RBC: 4.38 MIL/uL (ref 3.87–5.11)
RDW: 12.8 % (ref 11.5–15.5)
WBC Count: 3.3 10*3/uL — ABNORMAL LOW (ref 4.0–10.5)
nRBC: 0 % (ref 0.0–0.2)

## 2022-11-20 LAB — CMP (CANCER CENTER ONLY)
ALT: 11 U/L (ref 0–44)
AST: 15 U/L (ref 15–41)
Albumin: 4.2 g/dL (ref 3.5–5.0)
Alkaline Phosphatase: 67 U/L (ref 38–126)
Anion gap: 5 (ref 5–15)
BUN: 14 mg/dL (ref 6–20)
CO2: 30 mmol/L (ref 22–32)
Calcium: 9.5 mg/dL (ref 8.9–10.3)
Chloride: 104 mmol/L (ref 98–111)
Creatinine: 0.84 mg/dL (ref 0.44–1.00)
GFR, Estimated: >60 mL/min (ref >60)
Glucose, Bld: 98 mg/dL (ref 70–99)
Potassium: 3.9 mmol/L (ref 3.5–5.1)
Sodium: 139 mmol/L (ref 135–145)
Total Bilirubin: 0.5 mg/dL (ref 0.3–1.2)
Total Protein: 7.2 g/dL (ref 6.5–8.1)

## 2022-11-20 MED ORDER — SODIUM CHLORIDE 0.9% FLUSH
10.0000 mL | Freq: Once | INTRAVENOUS | Status: AC
  Administered 2022-11-20: 10 mL

## 2022-11-20 MED ORDER — HEPARIN SOD (PORK) LOCK FLUSH 100 UNIT/ML IV SOLN
500.0000 [IU] | Freq: Once | INTRAVENOUS | Status: AC
  Administered 2022-11-20: 500 [IU]

## 2022-11-20 MED ORDER — GOSERELIN ACETATE 3.6 MG ~~LOC~~ IMPL
3.6000 mg | DRUG_IMPLANT | Freq: Once | SUBCUTANEOUS | Status: AC
  Administered 2022-11-20: 3.6 mg via SUBCUTANEOUS
  Filled 2022-11-20: qty 3.6

## 2022-11-20 NOTE — Progress Notes (Signed)
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 Indeterminate mass in the right breast at 12:30, which may represent a fibroadenoma. No evidence of right axillary adenopathy.   05/16/2021 Pathology Results   Right breast 10 0 clock mass biopsy showed invasive mammary carcinoma, ductal phenotype prognostics include ER 95% strong staining intensity PR 95% strong staining intensity Ki-67 of 40% and HER2 negative   06/11/2021 Cancer Staging   Staging form: Breast, AJCC 8th Edition - Pathologic: Stage IIA (pT2, pN1a, cM0, G3, ER+, PR+, HER2-, Oncotype DX score: 24) - Signed by [name], MD on 11/04/2021 Multigene prognostic tests performed: Oncotype DX Recurrence score range: Greater than or equal to 11 Histologic grading system: 3 grade system   07/10/2021 Genetic Testing   Negative hereditary cancer genetic testing: no pathogenic variants detected [facility] Breast STAT Panel or Common Hereditary Cancers +RNA Panel.  Report dates are Jul 10, 2021 and Jul 18, 2021.   The STAT Breast cancer panel offered by [facility] includes sequencing and rearrangement analysis for the following 9 genes:  ATM, BRCA1, BRCA2, CDH1, CHEK2, PALB2, PTEN, STK11 and TP53.   The Common Hereditary Cancers + RNA Panel offered by [facility] includes sequencing, deletion/duplication, and RNA testing of the following 47 genes: APC, ATM, AXIN2, BARD1, BMPR1A, BRCA1, BRCA2, BRIP1, CDH1, CDK4*, CDKN2A (p14ARF)*, CDKN2A (p16INK4a)*, CHEK2, CTNNA1, DICER1, EPCAM (Deletion/duplication testing only), GREM1 (promoter region deletion/duplication testing only), KIT, MEN1, MLH1, MSH2, MSH3, MSH6, MUTYH, NBN, NF1, NHTL1, PALB2, PDGFRA*, PMS2, POLD1, POLE, PTEN, RAD50, RAD51C, RAD51D, SDHB, SDHC, SDHD, SMAD4, SMARCA4. STK11, TP53, TSC1,  TSC2, and VHL.  The following genes were evaluated for sequence changes only: SDHA and HOXB13 c.251G>A variant only.  RNA analysis is not performed for the * genes.     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  Right axillary lymph node evaluation showed 1 out of 8 lymph nodes positive for malignancy., final pathologic staging T2N1A.    11/11/2021 - 03/24/2022 Chemotherapy   Patient is on Treatment Plan : BREAST ADJUVANT DOSE DENSE AC q14d / PACLitaxel q7d     05/01/2022 - 06/16/2022 Radiation Therapy   First Treatment Date: 2022-05-01 - Last Treatment Date: 2022-06-16   Plan Name: CW_R_BO Site: Chest Wall, Right Technique: 3D Mode: Photon Dose Per Fraction: 1.8 Gy Prescribed Dose (Delivered / Prescribed): 25.2 Gy / 25.2 Gy Prescribed Fxs (Delivered / Prescribed): 14 / 14   Plan Name: CW_R_PAB_SCV Site: Chest Wall, Right Technique: 3D Mode: Photon Dose Per Fraction: 1.8 Gy Prescribed Dose (Delivered / Prescribed): 50.4 Gy / 50.4 Gy Prescribed Fxs (Delivered / Prescribed): 28 / 28   Plan Name: CW_R_Bst_BO Site: Chest Wall, Right Technique: Electron Mode: Electron Dose Per Fraction: 2 Gy Prescribed Dose (Delivered / Prescribed): 10 Gy / 10 Gy Prescribed Fxs (Delivered / Prescribed): 5 / 5   Plan Name: CW_R Site: Chest Wall, Right Technique: 3D Mode: Photon Dose Per Fraction: 1.8 Gy Prescribed Dose (Delivered / Prescribed): 25.2 Gy / 25.2 Gy Prescribed Fxs (Delivered / Prescribed): 14 / 14   06/30/2022 -  Anti-estrogen oral therapy   Zoladex injection every 4 weeks, and Anastrozole beginning  06/30/2022 Verzenio BID beginning at the end of May/early June, 2024     INTERVAL HISTORY:   Discussed the use of AI scribe software for clinical note transcription with the patient, who gave verbal consent to  proceed.  History of Present Illness   The patient, with a history of breast cancer, presents for a follow-up visit. She has been adhering to her prescribed regimen of Verzenio and Anastrozole. She has been maintaining an active lifestyle, including running, although recent weather conditions have somewhat limited this activity. She has been receiving monthly Zoladex injections, which have caused some body aches and fatigue, symptoms she liken to menopause. She has been considering an oophorectomy, but would prefer to delay this until next year, wanting to settle into her current routine. She has also been diagnosed with osteopenia, and has been advised to incorporate strength training into her exercise routine to improve bone density.      REVIEW OF SYSTEMS:  Review of Systems  Constitutional:  Negative for appetite change, chills, fatigue, fever and unexpected weight change.  HENT:   Negative for hearing loss, lump/mass and trouble swallowing.   Eyes:  Negative for eye problems and icterus.  Respiratory:  Negative for chest tightness, cough and shortness of breath.   Cardiovascular:  Negative for chest pain, leg swelling and palpitations.  Gastrointestinal:  Negative for abdominal distention, abdominal pain, constipation, diarrhea, nausea and vomiting.  Endocrine: Positive for hot flashes.  Genitourinary:  Negative for difficulty urinating.   Musculoskeletal:  Negative for arthralgias.  Skin:  Negative for itching and rash.  Neurological:  Negative for dizziness, extremity weakness, headaches and numbness.  Hematological:  Negative for adenopathy. Does not bruise/bleed easily.  Psychiatric/Behavioral:  Negative for depression. The patient is not nervous/anxious.   Breast: Denies any new nodularity, masses, tenderness, nipple changes, or nipple discharge.       PAST MEDICAL/SURGICAL HISTORY:  Past Medical History:  Diagnosis Date   Medical history non-contributory    Past Surgical  History:  Procedure Laterality Date   BREAST BIOPSY Left 05/16/2021   BREAST BIOPSY Left 07/08/2021   BREAST RECONSTRUCTION WITH PLACEMENT OF TISSUE EXPANDER AND FLEX HD (ACELLULAR HYDRATED DERMIS) Right 09/30/2021   Procedure: BREAST RECONSTRUCTION WITH PLACEMENT OF TISSUE EXPANDER AND FLEX HD (ACELLULAR HYDRATED DERMIS);  Surgeon: Peggye Form, DO;  Location: [facility];  Service: Plastics;  Laterality: Right;   MASTECTOMY Right 09/30/2021   MASTECTOMY W/ SENTINEL NODE BIOPSY Right 09/30/2021   Procedure: RIGHT MASTECTOMY WITH SENTINEL LYMPH NODE BIOPSY;  Surgeon: Griselda Miner, MD;  Location: [facility];  Service: General;  Laterality: Right;   PORTACATH PLACEMENT Left 11/08/2021   Procedure: INSERTION PORT-A-CATH;  Surgeon: Griselda Miner, MD;  Location: [facility];  Service: General;  Laterality: Left;     ALLERGIES:  No Known Allergies   CURRENT MEDICATIONS:  Outpatient Encounter Medications as of 11/20/2022  Medication Sig   abemaciclib (VERZENIO) 150 MG tablet Take 1 tablet (150 mg total) by mouth 2 (two) times daily.   anastrozole (ARIMIDEX) 1 MG tablet Take 1 tablet (1 mg total) by mouth daily.   loperamide (IMODIUM A-D) 2 MG tablet Take 2 mg by mouth as needed for diarrhea or loose stools. Take 2 tabs (4 mg) with first loose stool, then 1 tab (2 mg) with each additional loose stool. Do not take more than 8 tabs (16 mg) in a 24-hour period. (Patient not taking: Reported on 10/06/2022)   No  facility-administered encounter medications on file as of 11/20/2022.     ONCOLOGIC FAMILY HISTORY:  Family History  Problem Relation Age of Onset   Hypertension Father    Diabetes Father    Thyroid disease Father    Ovarian cancer Other        MGM's niece; d. > 50     SOCIAL HISTORY:  Social History   Socioeconomic History   Marital status: Single    Spouse name: Not on file   Number of children: 1   Years of education: Not  on file   Highest education level: High school graduate  Occupational History   Not on file  Tobacco Use   Smoking status: Never   Smokeless tobacco: Never  Vaping Use   Vaping status: Never Used  Substance and Sexual Activity   Alcohol use: Yes    Comment: occasionally   Drug use: Never   Sexual activity: Not Currently    Birth control/protection: Post-menopausal  Other Topics Concern   Not on file  Social History Narrative   Not on file   Social Determinants of Health   Financial Resource Strain: Not on file  Food Insecurity: No Food Insecurity (09/18/2022)   Hunger Vital Sign    Worried About Running Out of Food in the Last Year: Never true    Ran Out of Food in the Last Year: Never true  Transportation Needs: No Transportation Needs (09/18/2022)   PRAPARE - Administrator, Civil Service (Medical): No    Lack of Transportation (Non-Medical): No  Physical Activity: Not on file  Stress: Not on file  Social Connections: Not on file  Intimate Partner Violence: Not At Risk (04/10/2022)   Humiliation, Afraid, Rape, and Kick questionnaire    Fear of Current or Ex-Partner: No    Emotionally Abused: No    Physically Abused: No    Sexually Abused: No     OBSERVATIONS/OBJECTIVE:  BP 123/66 (BP Location: Left Arm, Patient Position: Sitting)   Pulse 79   Temp (!) 97 F (36.1 C) (Temporal)   Resp 18   Wt 147 lb 5 oz (66.8 kg)   SpO2 99%   BMI 24.51 kg/m  GENERAL: Patient is a well appearing female in no acute distress HEENT:  Sclerae anicteric.  Oropharynx clear and moist. No ulcerations or evidence of oropharyngeal candidiasis. Neck is supple.  NODES:  No cervical, supraclavicular, or axillary lymphadenopathy palpated.  BREAST EXAM: Right breast status postmastectomy and reconstruction. Left breast normal to inspection and palpation. LUNGS:  Clear to auscultation bilaterally.  No wheezes or rhonchi. HEART:  Regular rate and rhythm. No murmur  appreciated. ABDOMEN:  Soft, nontender.  Positive, normoactive bowel sounds. No organomegaly palpated. MSK:  No focal spinal tenderness to palpation. Full range of motion bilaterally in the upper extremities. EXTREMITIES:  No peripheral edema.   SKIN:  Clear with no obvious rashes or skin changes. No nail dyscrasia. NEURO:  Nonfocal. Well oriented.  Appropriate affect.   LABORATORY DATA:  None for this visit.  DIAGNOSTIC IMAGING:  None for this visit.     ASSESSMENT AND PLAN:  Ms.. Reburn is a pleasant 47 y.o. 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   Breast Cancer On Verzenio and Anastrozole with good tolerance. Mammogram and breast exam are normal. -Continue Verzenio and Anastrozole. -Verzenio  to be stopped in May 2026. -Anastrozole to be continued for a total of 5 years.  Menopausal Symptoms Experiencing body aches and fatigue likely due to Zoladex injections inducing menopause. Discussed options of oophorectomy or switching to Tamoxifen. Patient prefers to continue current regimen. -Continue Zoladex injections monthly. -Revisit discussion of oophorectomy or switch to Tamoxifen if patient becomes intolerant of side effects.  Osteopenia Discussed options of strength training or starting medication to improve bone density. Patient to consider and schedule dental evaluation before starting any medication. -Encourage strength training. -Schedule dental evaluation before considering medication for osteopenia.  Follow-up Labs every 8 weeks due to [facility]. Zoladex injections every 28 days. -Schedule follow-up in 8 weeks. -Continue Zoladex injections every 28 days.  Total time: 30 min  *Total Encounter Time as defined by the Centers for Medicare and Medicaid  Services includes, in addition to the face-to-face time of a patient visit (documented in the note above) non-face-to-face time: obtaining and reviewing outside history, ordering and reviewing medications, tests or procedures, care coordination (communications with other health care professionals or caregivers) and documentation in the medical record.

## 2022-12-15 ENCOUNTER — Other Ambulatory Visit: Payer: Self-pay | Admitting: Hematology and Oncology

## 2022-12-19 ENCOUNTER — Inpatient Hospital Stay: Payer: Self-pay | Attending: Hematology and Oncology

## 2022-12-19 ENCOUNTER — Ambulatory Visit: Payer: No Typology Code available for payment source

## 2022-12-19 ENCOUNTER — Other Ambulatory Visit: Payer: Self-pay

## 2022-12-19 ENCOUNTER — Ambulatory Visit: Payer: Self-pay | Attending: Hematology and Oncology

## 2022-12-19 VITALS — BP 122/72 | HR 81 | Temp 98.6°F | Resp 18

## 2022-12-19 VITALS — Wt 146.4 lb

## 2022-12-19 DIAGNOSIS — Z95828 Presence of other vascular implants and grafts: Secondary | ICD-10-CM

## 2022-12-19 DIAGNOSIS — Z5111 Encounter for antineoplastic chemotherapy: Secondary | ICD-10-CM | POA: Insufficient documentation

## 2022-12-19 DIAGNOSIS — Z483 Aftercare following surgery for neoplasm: Secondary | ICD-10-CM | POA: Insufficient documentation

## 2022-12-19 DIAGNOSIS — C50411 Malignant neoplasm of upper-outer quadrant of right female breast: Secondary | ICD-10-CM | POA: Insufficient documentation

## 2022-12-19 MED ORDER — GOSERELIN ACETATE 3.6 MG ~~LOC~~ IMPL
3.6000 mg | DRUG_IMPLANT | Freq: Once | SUBCUTANEOUS | Status: AC
  Administered 2022-12-19: 3.6 mg via SUBCUTANEOUS
  Filled 2022-12-19: qty 3.6

## 2022-12-19 NOTE — Therapy (Signed)
OUTPATIENT PHYSICAL THERAPY SOZO SCREENING NOTE   Patient Name: Hannah [facility] MRN: [id number] DOB:1975-06-02, 47 y.o., female Today's Date: 12/19/2022  PCP: [name], MD REFERRING PROVIDER: [name], MD   PT End of Session - 12/19/22 1054     Visit Number 2   # unchanged due to screen only   PT Start Time 1051    PT Stop Time 1056    PT Time Calculation (min) 5 min    Activity Tolerance Patient tolerated treatment well    Behavior During Therapy [facility] for tasks assessed/performed             Past Medical History:  Diagnosis Date   Medical history non-contributory    Past Surgical History:  Procedure Laterality Date   BREAST BIOPSY Left 05/16/2021   BREAST BIOPSY Left 07/08/2021   BREAST RECONSTRUCTION WITH PLACEMENT OF TISSUE EXPANDER AND FLEX HD (ACELLULAR HYDRATED DERMIS) Right 09/30/2021   Procedure: BREAST RECONSTRUCTION WITH PLACEMENT OF TISSUE EXPANDER AND FLEX HD (ACELLULAR HYDRATED DERMIS);  Surgeon: Peggye Form, DO;  Location: [facility];  Service: Plastics;  Laterality: Right;   MASTECTOMY Right 09/30/2021   MASTECTOMY W/ SENTINEL NODE BIOPSY Right 09/30/2021   Procedure: RIGHT MASTECTOMY WITH SENTINEL LYMPH NODE BIOPSY;  Surgeon: Griselda Miner, MD;  Location: [facility];  Service: General;  Laterality: Right;   PORTACATH PLACEMENT Left 11/08/2021   Procedure: INSERTION PORT-A-CATH;  Surgeon: Griselda Miner, MD;  Location: [facility];  Service: General;  Laterality: Left;   Patient Active Problem List   Diagnosis Date Noted   Port-A-Cath in place 03/11/2022   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  It measures 4.3 cm and is located in the upper-outer quadrant. She had surgery on 09/30/2021 for Right Mastectomy with tissue expander and  with SLNB with 1/8 positive LN's. Pt started chemotherapy on 11/11/2021. She will also have radiation and chemotherapy.   PRECAUTIONS: right UE Lymphedema risk, None  SUBJECTIVE: Pt returns for her 3 month L-Dex screen.   PAIN:  Are you having pain? No  SOZO SCREENING: Patient was assessed today using the SOZO machine to determine the lymphedema index score. This was compared to her baseline score. It was determined that she is within the recommended range when compared to her baseline and no further action is needed at this time. She will continue SOZO screenings. These are done every 3 months for 2 years post operatively followed by every 6 months for 2 years, and then annually.   L-DEX FLOWSHEETS - 12/19/22 1000       L-DEX LYMPHEDEMA SCREENING   Measurement Type Unilateral    L-DEX MEASUREMENT EXTREMITY Upper Extremity    POSITION  Standing    DOMINANT SIDE Right    At Risk Side Right    BASELINE SCORE (UNILATERAL) -1.7    L-DEX SCORE (UNILATERAL) 1.7    VALUE CHANGE (UNILAT) 3.4               Hermenia Bers, PTA 12/19/2022, 10:59 AM

## 2022-12-22 ENCOUNTER — Ambulatory Visit: Payer: No Typology Code available for payment source

## 2023-01-08 ENCOUNTER — Other Ambulatory Visit: Payer: Self-pay | Admitting: Hematology and Oncology

## 2023-01-16 ENCOUNTER — Inpatient Hospital Stay: Payer: Self-pay

## 2023-01-16 ENCOUNTER — Inpatient Hospital Stay: Payer: Self-pay | Attending: Hematology and Oncology | Admitting: Hematology and Oncology

## 2023-01-16 VITALS — BP 134/59 | HR 69 | Temp 98.2°F | Resp 16 | Wt 146.7 lb

## 2023-01-16 DIAGNOSIS — M8580 Other specified disorders of bone density and structure, unspecified site: Secondary | ICD-10-CM | POA: Insufficient documentation

## 2023-01-16 DIAGNOSIS — Z452 Encounter for adjustment and management of vascular access device: Secondary | ICD-10-CM | POA: Insufficient documentation

## 2023-01-16 DIAGNOSIS — Z5111 Encounter for antineoplastic chemotherapy: Secondary | ICD-10-CM | POA: Insufficient documentation

## 2023-01-16 DIAGNOSIS — C50411 Malignant neoplasm of upper-outer quadrant of right female breast: Secondary | ICD-10-CM | POA: Insufficient documentation

## 2023-01-16 DIAGNOSIS — Z95828 Presence of other vascular implants and grafts: Secondary | ICD-10-CM

## 2023-01-16 DIAGNOSIS — Z79811 Long term (current) use of aromatase inhibitors: Secondary | ICD-10-CM | POA: Insufficient documentation

## 2023-01-16 DIAGNOSIS — N951 Menopausal and female climacteric states: Secondary | ICD-10-CM | POA: Insufficient documentation

## 2023-01-16 DIAGNOSIS — Z170 Estrogen receptor positive status [ER+]: Secondary | ICD-10-CM | POA: Insufficient documentation

## 2023-01-16 LAB — CBC WITH DIFFERENTIAL (CANCER CENTER ONLY)
Abs Immature Granulocytes: 0.01 10*3/uL (ref 0.00–0.07)
Basophils Absolute: 0 10*3/uL (ref 0.0–0.1)
Basophils Relative: 1 %
Eosinophils Absolute: 0.1 10*3/uL (ref 0.0–0.5)
Eosinophils Relative: 2 %
HCT: 39.8 % (ref 36.0–46.0)
Hemoglobin: 13.1 g/dL (ref 12.0–15.0)
Immature Granulocytes: 0 %
Lymphocytes Relative: 28 %
Lymphs Abs: 1.1 10*3/uL (ref 0.7–4.0)
MCH: 29.2 pg (ref 26.0–34.0)
MCHC: 32.9 g/dL (ref 30.0–36.0)
MCV: 88.6 fL (ref 80.0–100.0)
Monocytes Absolute: 0.3 10*3/uL (ref 0.1–1.0)
Monocytes Relative: 8 %
Neutro Abs: 2.3 10*3/uL (ref 1.7–7.7)
Neutrophils Relative %: 61 %
Platelet Count: 224 10*3/uL (ref 150–400)
RBC: 4.49 MIL/uL (ref 3.87–5.11)
RDW: 12 % (ref 11.5–15.5)
WBC Count: 3.8 10*3/uL — ABNORMAL LOW (ref 4.0–10.5)
nRBC: 0 % (ref 0.0–0.2)

## 2023-01-16 LAB — CMP (CANCER CENTER ONLY)
ALT: 12 U/L (ref 0–44)
AST: 16 U/L (ref 15–41)
Albumin: 4.3 g/dL (ref 3.5–5.0)
Alkaline Phosphatase: 69 U/L (ref 38–126)
Anion gap: 5 (ref 5–15)
BUN: 16 mg/dL (ref 6–20)
CO2: 29 mmol/L (ref 22–32)
Calcium: 9.4 mg/dL (ref 8.9–10.3)
Chloride: 106 mmol/L (ref 98–111)
Creatinine: 0.85 mg/dL (ref 0.44–1.00)
GFR, Estimated: >60 mL/min (ref >60)
Glucose, Bld: 85 mg/dL (ref 70–99)
Potassium: 3.8 mmol/L (ref 3.5–5.1)
Sodium: 140 mmol/L (ref 135–145)
Total Bilirubin: 0.6 mg/dL (ref <1.2)
Total Protein: 7.2 g/dL (ref 6.5–8.1)

## 2023-01-16 MED ORDER — GOSERELIN ACETATE 3.6 MG ~~LOC~~ IMPL
3.6000 mg | DRUG_IMPLANT | Freq: Once | SUBCUTANEOUS | Status: AC
  Administered 2023-01-16: 3.6 mg via SUBCUTANEOUS
  Filled 2023-01-16: qty 3.6

## 2023-01-16 MED ORDER — HEPARIN SOD (PORK) LOCK FLUSH 100 UNIT/ML IV SOLN
500.0000 [IU] | Freq: Once | INTRAVENOUS | Status: AC
  Administered 2023-01-16: 500 [IU]

## 2023-01-16 MED ORDER — SODIUM CHLORIDE 0.9% FLUSH
10.0000 mL | Freq: Once | INTRAVENOUS | Status: AC
  Administered 2023-01-16: 10 mL

## 2023-01-16 NOTE — Progress Notes (Signed)
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 Indeterminate mass in the right breast at 12:30, which may represent a fibroadenoma. No evidence of right axillary adenopathy.   05/16/2021 Pathology Results   Right breast 10 0 clock mass biopsy showed invasive mammary carcinoma, ductal phenotype prognostics include ER 95% strong staining intensity PR 95% strong staining intensity Ki-67 of 40% and HER2 negative   06/11/2021 Cancer Staging   Staging form: Breast, AJCC 8th Edition - Pathologic: Stage IIA (pT2, pN1a, cM0, G3, ER+, PR+, HER2-, Oncotype DX score: 24) - Signed by [name], MD on 11/04/2021 Multigene prognostic tests performed: Oncotype DX Recurrence score range: Greater than or equal to 11 Histologic grading system: 3 grade system   07/10/2021 Genetic Testing   Negative hereditary cancer genetic testing: no pathogenic variants detected [facility] Breast STAT Panel or Common Hereditary Cancers +RNA Panel.  Report dates are Jul 10, 2021 and Jul 18, 2021.   The STAT Breast cancer panel offered by [facility] includes sequencing and rearrangement analysis for the following 9 genes:  ATM, BRCA1, BRCA2, CDH1, CHEK2, PALB2, PTEN, STK11 and TP53.   The Common Hereditary Cancers + RNA Panel offered by [facility] includes sequencing, deletion/duplication, and RNA testing of the following 47 genes: APC, ATM, AXIN2, BARD1, BMPR1A, BRCA1, BRCA2, BRIP1, CDH1, CDK4*, CDKN2A (p14ARF)*, CDKN2A (p16INK4a)*, CHEK2, CTNNA1, DICER1, EPCAM (Deletion/duplication testing only), GREM1 (promoter region deletion/duplication testing only), KIT, MEN1, MLH1, MSH2, MSH3, MSH6, MUTYH, NBN, NF1, NHTL1, PALB2, PDGFRA*, PMS2, POLD1, POLE, PTEN, RAD50, RAD51C, RAD51D, SDHB, SDHC, SDHD, SMAD4, SMARCA4. STK11, TP53, TSC1,  TSC2, and VHL.  The following genes were evaluated for sequence changes only: SDHA and HOXB13 c.251G>A variant only.  RNA analysis is not performed for the * genes.     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  Right axillary lymph node evaluation showed 1 out of 8 lymph nodes positive for malignancy., final pathologic staging T2N1A.    11/11/2021 - 03/24/2022 Chemotherapy   Patient is on Treatment Plan : BREAST ADJUVANT DOSE DENSE AC q14d / PACLitaxel q7d     05/01/2022 - 06/16/2022 Radiation Therapy   First Treatment Date: 2022-05-01 - Last Treatment Date: 2022-06-16   Plan Name: CW_R_BO Site: Chest Wall, Right Technique: 3D Mode: Photon Dose Per Fraction: 1.8 Gy Prescribed Dose (Delivered / Prescribed): 25.2 Gy / 25.2 Gy Prescribed Fxs (Delivered / Prescribed): 14 / 14   Plan Name: CW_R_PAB_SCV Site: Chest Wall, Right Technique: 3D Mode: Photon Dose Per Fraction: 1.8 Gy Prescribed Dose (Delivered / Prescribed): 50.4 Gy / 50.4 Gy Prescribed Fxs (Delivered / Prescribed): 28 / 28   Plan Name: CW_R_Bst_BO Site: Chest Wall, Right Technique: Electron Mode: Electron Dose Per Fraction: 2 Gy Prescribed Dose (Delivered / Prescribed): 10 Gy / 10 Gy Prescribed Fxs (Delivered / Prescribed): 5 / 5   Plan Name: CW_R Site: Chest Wall, Right Technique: 3D Mode: Photon Dose Per Fraction: 1.8 Gy Prescribed Dose (Delivered / Prescribed): 25.2 Gy / 25.2 Gy Prescribed Fxs (Delivered / Prescribed): 14 / 14   06/30/2022 -  Anti-estrogen oral therapy   Zoladex injection every 4 weeks, and Anastrozole beginning  06/30/2022 Verzenio BID beginning at the end of May/early June, 2024     INTERVAL HISTORY:   Discussed the use of AI scribe software for clinical note transcription with the patient, who gave verbal consent to  proceed.  History of Present Illness    The patient, with a history of breast cancer, presents for a follow-up visit. She has been adhering to her prescribed regimen of Verzenio and Anastrozole with OFS. She complains of arthralgias since she started zoladex. She still maintains an active life style, running, regularly lifting weight. She has some intermittent hot flashes, mild nausea, no diarrhea. Most recent bone density with osteopenia. Mammogram in July 2024 of left breast unremarkable. Rest of the pertinent 10 point ROS reviewed and neg  PAST MEDICAL/SURGICAL HISTORY:  Past Medical History:  Diagnosis Date   Medical history non-contributory    Past Surgical History:  Procedure Laterality Date   BREAST BIOPSY Left 05/16/2021   BREAST BIOPSY Left 07/08/2021   BREAST RECONSTRUCTION WITH PLACEMENT OF TISSUE EXPANDER AND FLEX HD (ACELLULAR HYDRATED DERMIS) Right 09/30/2021   Procedure: BREAST RECONSTRUCTION WITH PLACEMENT OF TISSUE EXPANDER AND FLEX HD (ACELLULAR HYDRATED DERMIS);  Surgeon: Peggye Form, DO;  Location: [facility];  Service: Plastics;  Laterality: Right;   MASTECTOMY Right 09/30/2021   MASTECTOMY W/ SENTINEL NODE BIOPSY Right 09/30/2021   Procedure: RIGHT MASTECTOMY WITH SENTINEL LYMPH NODE BIOPSY;  Surgeon: Griselda Miner, MD;  Location: [facility];  Service: General;  Laterality: Right;   PORTACATH PLACEMENT Left 11/08/2021   Procedure: INSERTION PORT-A-CATH;  Surgeon: Griselda Miner, MD;  Location: [facility];  Service: General;  Laterality: Left;     ALLERGIES:  No Known Allergies   CURRENT MEDICATIONS:  Outpatient Encounter Medications as of 01/16/2023  Medication Sig   anastrozole (ARIMIDEX) 1 MG tablet Take 1 tablet (1 mg total) by mouth daily.   loperamide (IMODIUM A-D) 2 MG tablet Take 2 mg by mouth as needed for diarrhea or loose stools. Take 2 tabs (4 mg) with first loose stool, then 1 tab (2 mg) with  each additional loose stool. Do not take more than 8 tabs (16 mg) in a 24-hour period. (Patient not taking: Reported on 10/06/2022)   VERZENIO 150 MG tablet TAKE 1 TABLET BY MOUTH TWICE DAILY   No facility-administered encounter medications on file as of 01/16/2023.     ONCOLOGIC FAMILY HISTORY:  Family History  Problem Relation Age of Onset   Hypertension Father    Diabetes Father    Thyroid disease Father    Ovarian cancer Other        MGM's niece; d. > 50     SOCIAL HISTORY:  Social History   Socioeconomic History   Marital status: Single    Spouse name: Not on file   Number of children: 1   Years of education: Not on file   Highest education level: High school graduate  Occupational History   Not on file  Tobacco Use   Smoking status: Never   Smokeless tobacco: Never  Vaping Use   Vaping status: Never Used  Substance and Sexual Activity   Alcohol use: Yes    Comment: occasionally   Drug use: Never   Sexual activity: Not Currently    Birth control/protection: Post-menopausal  Other Topics Concern   Not on file  Social History Narrative   Not on file   Social Determinants of Health   Financial Resource Strain: Not on  file  Food Insecurity: No Food Insecurity (09/18/2022)   Hunger Vital Sign    Worried About Running Out of Food in the Last Year: Never true    Ran Out of Food in the Last Year: Never true  Transportation Needs: No Transportation Needs (09/18/2022)   PRAPARE - Administrator, Civil Service (Medical): No    Lack of Transportation (Non-Medical): No  Physical Activity: Not on file  Stress: Not on file  Social Connections: Not on file  Intimate Partner Violence: Not At Risk (04/10/2022)   Humiliation, Afraid, Rape, and Kick questionnaire    Fear of Current or Ex-Partner: No    Emotionally Abused: No    Physically Abused: No    Sexually Abused: No     OBSERVATIONS/OBJECTIVE:  BP (!) 134/59 (BP Location: Left Arm, Patient Position:  Sitting)   Pulse 69   Temp 98.2 F (36.8 C) (Temporal)   Resp 16   Wt 146 lb 11.2 oz (66.5 kg)   SpO2 98%   BMI 24.41 kg/m   GENERAL: Patient is a well appearing female in no acute distress HEENT:  Sclerae anicteric.  Oropharynx clear and moist. No ulcerations or evidence of oropharyngeal candidiasis. Neck is supple.  NODES:  No cervical, supraclavicular, or axillary lymphadenopathy palpated.  BREAST EXAM: Right breast status postmastectomy and reconstruction. Left breast normal to inspection and palpation. LUNGS:  Clear to auscultation bilaterally.  No wheezes or rhonchi. HEART:  Regular rate and rhythm. No murmur appreciated. ABDOMEN:  Soft, nontender.  Positive, normoactive bowel sounds. No organomegaly palpated. MSK:  No focal spinal tenderness to palpation. Full range of motion bilaterally in the upper extremities. EXTREMITIES:  No peripheral edema.   SKIN:  Clear with no obvious rashes or skin changes. No nail dyscrasia. NEURO:  Nonfocal. Well oriented.  Appropriate affect.   LABORATORY DATA:  None for this visit.  DIAGNOSTIC IMAGING:  None for this visit.     ASSESSMENT AND PLAN:  Ms.. Hannah Murray is a pleasant 47 y.o. 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   Breast Cancer On Verzenio and Anastrozole with good tolerance. Mammogram and breast exam are normal. -Continue Verzenio and Anastrozole. -Verzenio to be stopped in May 2026. -Anastrozole to be continued for a total of 5 years.  Menopausal Symptoms Experiencing body aches and fatigue likely due to Zoladex injections inducing menopause. Overall the side effects are tolerable. -Continue Zoladex injections monthly. -Revisit discussion of oophorectomy or switch to Tamoxifen if patient becomes  intolerant of side effects.  Osteopenia Discussed options of strength training or starting medication to improve bone density. She is reluctant to do the bisphosphonates, has a dentist appointment in December. She is hoping to take Vit D/calcium and strength training -Encourage strength training. -Anyway we will consider zometa or reclast for osteopenia once we get dental clearance  Follow-up Labs every 8 weeks due to Verzenio. Zoladex injections every 28 days. -Schedule follow-up in 8 weeks. -Continue Zoladex injections every 28 days.  Total time: 30 min  *Total Encounter Time as defined by the Centers for Medicare and Medicaid Services includes, in addition to the face-to-face time of a patient visit (documented in the note above) non-face-to-face time: obtaining and reviewing outside history, ordering and reviewing medications, tests or procedures, care coordination (communications with other health care professionals or caregivers) and documentation in  the medical record.

## 2023-02-09 ENCOUNTER — Other Ambulatory Visit: Payer: Self-pay | Admitting: Hematology and Oncology

## 2023-02-13 ENCOUNTER — Ambulatory Visit: Payer: No Typology Code available for payment source

## 2023-02-16 ENCOUNTER — Inpatient Hospital Stay: Payer: Self-pay | Attending: Hematology and Oncology

## 2023-02-16 VITALS — BP 133/68 | HR 73 | Temp 99.0°F

## 2023-02-16 DIAGNOSIS — Z5111 Encounter for antineoplastic chemotherapy: Secondary | ICD-10-CM | POA: Insufficient documentation

## 2023-02-16 DIAGNOSIS — Z95828 Presence of other vascular implants and grafts: Secondary | ICD-10-CM

## 2023-02-16 DIAGNOSIS — C50411 Malignant neoplasm of upper-outer quadrant of right female breast: Secondary | ICD-10-CM | POA: Insufficient documentation

## 2023-02-16 MED ORDER — GOSERELIN ACETATE 3.6 MG ~~LOC~~ IMPL
3.6000 mg | DRUG_IMPLANT | Freq: Once | SUBCUTANEOUS | Status: AC
  Administered 2023-02-16: 3.6 mg via SUBCUTANEOUS
  Filled 2023-02-16: qty 3.6

## 2023-03-13 ENCOUNTER — Ambulatory Visit: Payer: No Typology Code available for payment source

## 2023-03-13 ENCOUNTER — Other Ambulatory Visit: Payer: No Typology Code available for payment source

## 2023-03-16 ENCOUNTER — Other Ambulatory Visit: Payer: Self-pay | Admitting: Hematology and Oncology

## 2023-03-16 ENCOUNTER — Other Ambulatory Visit: Payer: No Typology Code available for payment source

## 2023-03-16 ENCOUNTER — Ambulatory Visit: Payer: No Typology Code available for payment source

## 2023-03-17 ENCOUNTER — Inpatient Hospital Stay: Payer: Self-pay

## 2023-03-17 ENCOUNTER — Inpatient Hospital Stay: Payer: Self-pay | Admitting: Hematology and Oncology

## 2023-03-20 ENCOUNTER — Inpatient Hospital Stay (HOSPITAL_BASED_OUTPATIENT_CLINIC_OR_DEPARTMENT_OTHER): Payer: Self-pay | Admitting: Hematology and Oncology

## 2023-03-20 ENCOUNTER — Inpatient Hospital Stay: Payer: Self-pay | Attending: Hematology and Oncology

## 2023-03-20 ENCOUNTER — Inpatient Hospital Stay: Payer: Self-pay

## 2023-03-20 ENCOUNTER — Encounter: Payer: Self-pay | Admitting: Hematology and Oncology

## 2023-03-20 VITALS — BP 128/63 | HR 97 | Temp 98.3°F | Resp 18 | Ht 65.0 in | Wt 147.3 lb

## 2023-03-20 DIAGNOSIS — J069 Acute upper respiratory infection, unspecified: Secondary | ICD-10-CM | POA: Insufficient documentation

## 2023-03-20 DIAGNOSIS — C50412 Malignant neoplasm of upper-outer quadrant of left female breast: Secondary | ICD-10-CM | POA: Insufficient documentation

## 2023-03-20 DIAGNOSIS — M8580 Other specified disorders of bone density and structure, unspecified site: Secondary | ICD-10-CM | POA: Insufficient documentation

## 2023-03-20 DIAGNOSIS — C50411 Malignant neoplasm of upper-outer quadrant of right female breast: Secondary | ICD-10-CM

## 2023-03-20 DIAGNOSIS — Z5111 Encounter for antineoplastic chemotherapy: Secondary | ICD-10-CM | POA: Insufficient documentation

## 2023-03-20 DIAGNOSIS — Z95828 Presence of other vascular implants and grafts: Secondary | ICD-10-CM

## 2023-03-20 DIAGNOSIS — Z170 Estrogen receptor positive status [ER+]: Secondary | ICD-10-CM | POA: Insufficient documentation

## 2023-03-20 DIAGNOSIS — Z452 Encounter for adjustment and management of vascular access device: Secondary | ICD-10-CM | POA: Insufficient documentation

## 2023-03-20 DIAGNOSIS — Z79811 Long term (current) use of aromatase inhibitors: Secondary | ICD-10-CM | POA: Insufficient documentation

## 2023-03-20 LAB — CMP (CANCER CENTER ONLY)
ALT: 17 U/L (ref 0–44)
AST: 22 U/L (ref 15–41)
Albumin: 4.1 g/dL (ref 3.5–5.0)
Alkaline Phosphatase: 56 U/L (ref 38–126)
Anion gap: 7 (ref 5–15)
BUN: 16 mg/dL (ref 6–20)
CO2: 26 mmol/L (ref 22–32)
Calcium: 8.8 mg/dL — ABNORMAL LOW (ref 8.9–10.3)
Chloride: 101 mmol/L (ref 98–111)
Creatinine: 0.89 mg/dL (ref 0.44–1.00)
GFR, Estimated: >60 mL/min (ref >60)
Glucose, Bld: 94 mg/dL (ref 70–99)
Potassium: 3.7 mmol/L (ref 3.5–5.1)
Sodium: 134 mmol/L — ABNORMAL LOW (ref 135–145)
Total Bilirubin: 0.5 mg/dL (ref 0.0–1.2)
Total Protein: 6.8 g/dL (ref 6.5–8.1)

## 2023-03-20 LAB — CBC WITH DIFFERENTIAL (CANCER CENTER ONLY)
Abs Immature Granulocytes: 0.01 10*3/uL (ref 0.00–0.07)
Basophils Absolute: 0 10*3/uL (ref 0.0–0.1)
Basophils Relative: 0 %
Eosinophils Absolute: 0 10*3/uL (ref 0.0–0.5)
Eosinophils Relative: 0 %
HCT: 37.4 % (ref 36.0–46.0)
Hemoglobin: 12.4 g/dL (ref 12.0–15.0)
Immature Granulocytes: 0 %
Lymphocytes Relative: 25 %
Lymphs Abs: 1 10*3/uL (ref 0.7–4.0)
MCH: 27.7 pg (ref 26.0–34.0)
MCHC: 33.2 g/dL (ref 30.0–36.0)
MCV: 83.7 fL (ref 80.0–100.0)
Monocytes Absolute: 0.5 10*3/uL (ref 0.1–1.0)
Monocytes Relative: 11 %
Neutro Abs: 2.6 10*3/uL (ref 1.7–7.7)
Neutrophils Relative %: 64 %
Platelet Count: 147 10*3/uL — ABNORMAL LOW (ref 150–400)
RBC: 4.47 MIL/uL (ref 3.87–5.11)
RDW: 11.7 % (ref 11.5–15.5)
WBC Count: 4.2 10*3/uL (ref 4.0–10.5)
nRBC: 0 % (ref 0.0–0.2)

## 2023-03-20 MED ORDER — SODIUM CHLORIDE 0.9% FLUSH
10.0000 mL | Freq: Once | INTRAVENOUS | Status: AC
  Administered 2023-03-20: 10 mL

## 2023-03-20 MED ORDER — HEPARIN SOD (PORK) LOCK FLUSH 100 UNIT/ML IV SOLN
500.0000 [IU] | Freq: Once | INTRAVENOUS | Status: AC
  Administered 2023-03-20: 500 [IU]

## 2023-03-20 MED ORDER — GOSERELIN ACETATE 3.6 MG ~~LOC~~ IMPL
3.6000 mg | DRUG_IMPLANT | Freq: Once | SUBCUTANEOUS | Status: AC
Start: 2023-03-20 — End: 2023-03-20
  Administered 2023-03-20: 3.6 mg via SUBCUTANEOUS
  Filled 2023-03-20: qty 3.6

## 2023-03-20 NOTE — Progress Notes (Signed)
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 Indeterminate mass in the right breast at 12:30, which may represent a fibroadenoma. No evidence of right axillary adenopathy.   05/16/2021 Pathology Results   Right breast 10 0 clock mass biopsy showed invasive mammary carcinoma, ductal phenotype prognostics include ER 95% strong staining intensity PR 95% strong staining intensity Ki-67 of 40% and HER2 negative   06/11/2021 Cancer Staging   Staging form: Breast, AJCC 8th Edition - Pathologic: Stage IIA (pT2, pN1a, cM0, G3, ER+, PR+, HER2-, Oncotype DX score: 24) - Signed by [name], MD on 11/04/2021 Multigene prognostic tests performed: Oncotype DX Recurrence score range: Greater than or equal to 11 Histologic grading system: 3 grade system   07/10/2021 Genetic Testing   Negative hereditary cancer genetic testing: no pathogenic variants detected [facility] Breast STAT Panel or Common Hereditary Cancers +RNA Panel.  Report dates are Jul 10, 2021 and Jul 18, 2021.   The STAT Breast cancer panel offered by [facility] includes sequencing and rearrangement analysis for the following 9 genes:  ATM, BRCA1, BRCA2, CDH1, CHEK2, PALB2, PTEN, STK11 and TP53.   The Common Hereditary Cancers + RNA Panel offered by [facility] includes sequencing, deletion/duplication, and RNA testing of the following 47 genes: APC, ATM, AXIN2, BARD1, BMPR1A, BRCA1, BRCA2, BRIP1, CDH1, CDK4*, CDKN2A (p14ARF)*, CDKN2A (p16INK4a)*, CHEK2, CTNNA1, DICER1, EPCAM (Deletion/duplication testing only), GREM1 (promoter region deletion/duplication testing only), KIT, MEN1, MLH1, MSH2, MSH3, MSH6, MUTYH, NBN, NF1, NHTL1, PALB2, PDGFRA*, PMS2, POLD1, POLE, PTEN, RAD50, RAD51C, RAD51D, SDHB, SDHC, SDHD, SMAD4, SMARCA4. STK11, TP53, TSC1,  TSC2, and VHL.  The following genes were evaluated for sequence changes only: SDHA and HOXB13 c.251G>A variant only.  RNA analysis is not performed for the * genes.     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  Right axillary lymph node evaluation showed 1 out of 8 lymph nodes positive for malignancy., final pathologic staging T2N1A.    11/11/2021 - 03/24/2022 Chemotherapy   Patient is on Treatment Plan : BREAST ADJUVANT DOSE DENSE AC q14d / PACLitaxel q7d     05/01/2022 - 06/16/2022 Radiation Therapy   First Treatment Date: 2022-05-01 - Last Treatment Date: 2022-06-16   Plan Name: CW_R_BO Site: Chest Wall, Right Technique: 3D Mode: Photon Dose Per Fraction: 1.8 Gy Prescribed Dose (Delivered / Prescribed): 25.2 Gy / 25.2 Gy Prescribed Fxs (Delivered / Prescribed): 14 / 14   Plan Name: CW_R_PAB_SCV Site: Chest Wall, Right Technique: 3D Mode: Photon Dose Per Fraction: 1.8 Gy Prescribed Dose (Delivered / Prescribed): 50.4 Gy / 50.4 Gy Prescribed Fxs (Delivered / Prescribed): 28 / 28   Plan Name: CW_R_Bst_BO Site: Chest Wall, Right Technique: Electron Mode: Electron Dose Per Fraction: 2 Gy Prescribed Dose (Delivered / Prescribed): 10 Gy / 10 Gy Prescribed Fxs (Delivered / Prescribed): 5 / 5   Plan Name: CW_R Site: Chest Wall, Right Technique: 3D Mode: Photon Dose Per Fraction: 1.8 Gy Prescribed Dose (Delivered / Prescribed): 25.2 Gy / 25.2 Gy Prescribed Fxs (Delivered / Prescribed): 14 / 14   06/30/2022 -  Anti-estrogen oral therapy   Zoladex injection every 4 weeks, and Anastrozole beginning  06/30/2022 Verzenio BID beginning at the end of May/early June, 2024     INTERVAL HISTORY:   Discussed the use of AI scribe software for clinical note transcription with the patient, who gave verbal consent to  proceed.  History of Present Illness    Certified spanish interpreter present during the entirety of conversation.  The patient, who is currently being treated for breast cancer with Verzenio and anastrozole with OFS, presents with symptoms of a cold that started yesterday. The symptoms include a cough and a sore throat. The patient believes the cold was contracted from her child who is also sick.The patient denies having a fever but admits to experiencing chills.  In addition to the cold symptoms, the patient also reports shoulder pain, which she attributes to the last injection she received as part of her cancer treatment. The patient denies any new side effects from the cancer medications. The patient also mentions that she has been exercising at home due to the cold weather.  Mammogram in July 2024 of left breast unremarkable. Rest of the pertinent 10 point ROS reviewed and neg  PAST MEDICAL/SURGICAL HISTORY:  Past Medical History:  Diagnosis Date   Medical history non-contributory    Past Surgical History:  Procedure Laterality Date   BREAST BIOPSY Left 05/16/2021   BREAST BIOPSY Left 07/08/2021   BREAST RECONSTRUCTION WITH PLACEMENT OF TISSUE EXPANDER AND FLEX HD (ACELLULAR HYDRATED DERMIS) Right 09/30/2021   Procedure: BREAST RECONSTRUCTION WITH PLACEMENT OF TISSUE EXPANDER AND FLEX HD (ACELLULAR HYDRATED DERMIS);  Surgeon: Peggye Form, DO;  Location: [facility];  Service: Plastics;  Laterality: Right;   MASTECTOMY Right 09/30/2021   MASTECTOMY W/ SENTINEL NODE BIOPSY Right 09/30/2021   Procedure: RIGHT MASTECTOMY WITH SENTINEL LYMPH NODE BIOPSY;  Surgeon: Griselda Miner, MD;  Location: [facility];  Service: General;  Laterality: Right;   PORTACATH PLACEMENT Left 11/08/2021   Procedure: INSERTION PORT-A-CATH;  Surgeon: Griselda Miner, MD;  Location: [facility];  Service: General;  Laterality: Left;     ALLERGIES:  No Known  Allergies   CURRENT MEDICATIONS:  Outpatient Encounter Medications as of 03/20/2023  Medication Sig   anastrozole (ARIMIDEX) 1 MG tablet Take 1 tablet (1 mg total) by mouth daily.   loperamide (IMODIUM A-D) 2 MG tablet Take 2 mg by mouth as needed for diarrhea or loose stools. Take 2 tabs (4 mg) with first loose stool, then 1 tab (2 mg) with each additional loose stool. Do not take more than 8 tabs (16 mg) in a 24-hour period. (Patient not taking: Reported on 10/06/2022)   VERZENIO 150 MG tablet TAKE 1 TABLET BY MOUTH TWICE DAILY   No facility-administered encounter medications on file as of 03/20/2023.     ONCOLOGIC FAMILY HISTORY:  Family History  Problem Relation Age of Onset   Hypertension Father    Diabetes Father    Thyroid disease Father    Ovarian cancer Other        MGM's niece; d. > 50     SOCIAL HISTORY:  Social History   Socioeconomic History   Marital status: Single    Spouse name: Not on file   Number of children: 1   Years of education: Not on file   Highest education level: High school graduate  Occupational History   Not on file  Tobacco Use   Smoking status: Never   Smokeless tobacco: Never  Vaping Use   Vaping status: Never Used  Substance and Sexual Activity   Alcohol use: Yes    Comment: occasionally   Drug use: Never   Sexual activity: Not Currently    Birth control/protection: Post-menopausal  Other Topics Concern   Not on file  Social History Narrative   Not on file   Social Drivers of Health   Financial Resource Strain: Not on file  Food Insecurity: No Food Insecurity (09/18/2022)   Hunger Vital Sign    Worried About Running Out of Food in the Last Year: Never true    Ran Out of Food in the Last Year: Never true  Transportation Needs: No Transportation Needs (09/18/2022)   PRAPARE - Administrator, Civil Service (Medical): No    Lack of Transportation (Non-Medical): No  Physical Activity: Not on file  Stress: Not on file   Social Connections: Not on file  Intimate Partner Violence: Not At Risk (04/10/2022)   Humiliation, Afraid, Rape, and Kick questionnaire    Fear of Current or Ex-Partner: No    Emotionally Abused: No    Physically Abused: No    Sexually Abused: No     OBSERVATIONS/OBJECTIVE:  There were no vitals taken for this visit.  GENERAL: Patient is a well appearing female in no acute distress HEENT:  Sclerae anicteric.  Oropharynx clear and moist. No ulcerations or evidence of oropharyngeal candidiasis. Neck is supple.  NODES:  No cervical, supraclavicular, or axillary lymphadenopathy palpated.  BREAST EXAM: Right breast status postmastectomy and reconstruction. Left breast normal to inspection and palpation. LUNGS:  Clear to auscultation bilaterally.  No wheezes or rhonchi. HEART:  Regular rate and rhythm. No murmur appreciated. ABDOMEN:  Soft, nontender.  Positive, normoactive bowel sounds. No organomegaly palpated. MSK:  No focal spinal tenderness to palpation. Full range of motion bilaterally in the upper extremities. EXTREMITIES:  No peripheral edema.   SKIN:  Clear with no obvious rashes or skin changes. No nail dyscrasia. NEURO:  Nonfocal. Well oriented.  Appropriate affect.   LABORATORY DATA:  None for this visit.  DIAGNOSTIC IMAGING:  None for this visit.     ASSESSMENT AND PLAN:  Ms.. Minervini is a pleasant 48 y.o. 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   Breast Cancer On Verzenio and Anastrozole with good tolerance. Mammogram and breast exam are normal. -Continue Verzenio and Anastrozole. -Verzenio to be stopped in May 2026. -Anastrozole to be continued for a total of 5 years with OFS.  Osteopenia Discussed options of strength training or starting  medication to improve bone density.   She is hoping to take Vit D/calcium and strength training -Encourage strength training. -Anyway we will consider zometa or reclast for osteopenia once we get dental clearance  Upper Respiratory Infection Recent onset of cough, cold, and sore throat. No fever. Likely viral etiology given recent exposure to sick child and weather change. -Advise symptomatic treatment with over-the-counter cold medicine such as Benadryl and supportive care.  Follow-up Labs every 8 weeks due to [facility]. Zoladex injections every 28 days.   Total time: 30 min  *Total Encounter Time as defined by the Centers for Medicare and Medicaid Services includes, in addition to the face-to-face time of a patient visit (documented in the note above) non-face-to-face time: obtaining and reviewing outside history, ordering and reviewing medications, tests or procedures, care coordination (communications with other health care professionals  or caregivers) and documentation in the medical record.

## 2023-03-23 ENCOUNTER — Ambulatory Visit: Payer: Self-pay | Attending: Hematology and Oncology

## 2023-03-23 VITALS — Wt 146.5 lb

## 2023-03-23 DIAGNOSIS — Z483 Aftercare following surgery for neoplasm: Secondary | ICD-10-CM | POA: Insufficient documentation

## 2023-03-23 NOTE — Therapy (Signed)
  OUTPATIENT PHYSICAL THERAPY SOZO SCREENING NOTE   Patient Name: Hannah Murray MRN: [id number] DOB:May 12, 1975, 48 y.o., female Today's Date: 03/23/2023  PCP: [name], MD REFERRING PROVIDER: [name], MD   PT End of Session - 03/23/23 0911     Visit Number 2   # unchanged due to screen only   PT Start Time 0907    PT Stop Time 0912    PT Time Calculation (min) 5 min    Activity Tolerance Patient tolerated treatment well    Behavior During Therapy [name] for tasks assessed/performed             Past Medical History:  Diagnosis Date   Medical history non-contributory    Past Surgical History:  Procedure Laterality Date   BREAST BIOPSY Left 05/16/2021   BREAST BIOPSY Left 07/08/2021   BREAST RECONSTRUCTION WITH PLACEMENT OF TISSUE EXPANDER AND FLEX HD (ACELLULAR HYDRATED DERMIS) Right 09/30/2021   Procedure: BREAST RECONSTRUCTION WITH PLACEMENT OF TISSUE EXPANDER AND FLEX HD (ACELLULAR HYDRATED DERMIS);  Surgeon: Peggye Form, DO;  Location: [facility];  Service: Plastics;  Laterality: Right;   MASTECTOMY Right 09/30/2021   MASTECTOMY W/ SENTINEL NODE BIOPSY Right 09/30/2021   Procedure: RIGHT MASTECTOMY WITH SENTINEL LYMPH NODE BIOPSY;  Surgeon: Griselda Miner, MD;  Location: [facility];  Service: General;  Laterality: Right;   PORTACATH PLACEMENT Left 11/08/2021   Procedure: INSERTION PORT-A-CATH;  Surgeon: Griselda Miner, MD;  Location: [facility];  Service: General;  Laterality: Left;   Patient Active Problem List   Diagnosis Date Noted   Port-A-Cath in place 03/11/2022   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  It measures 4.3 cm and is located in the upper-outer quadrant. She had surgery on 09/30/2021 for Right Mastectomy with tissue expander and  with SLNB with 1/8 positive LN's. Pt started chemotherapy on 11/11/2021. She will also have radiation and chemotherapy.   PRECAUTIONS: right UE Lymphedema risk, None  SUBJECTIVE: Pt returns for her 3 month L-Dex screen.   PAIN:  Are you having pain? No  SOZO SCREENING: Patient was assessed today using the SOZO machine to determine the lymphedema index score. This was compared to her baseline score. It was determined that she is within the recommended range when compared to her baseline and no further action is needed at this time. She will continue SOZO screenings. These are done every 3 months for 2 years post operatively followed by every 6 months for 2 years, and then annually.   L-DEX FLOWSHEETS - 03/23/23 0900       L-DEX LYMPHEDEMA SCREENING   Measurement Type Unilateral    L-DEX MEASUREMENT EXTREMITY Upper Extremity    POSITION  Standing    DOMINANT SIDE Right    At Risk Side Right    BASELINE SCORE (UNILATERAL) -1.7    L-DEX SCORE (UNILATERAL) -3.1    VALUE CHANGE (UNILAT) -1.4               Hermenia Bers, PTA 03/23/2023, 9:13 AM

## 2023-04-06 ENCOUNTER — Other Ambulatory Visit: Payer: Self-pay | Admitting: *Deleted

## 2023-04-06 MED ORDER — ANASTROZOLE 1 MG PO TABS: 1.0000 mg | ORAL_TABLET | Freq: Every day | ORAL | 3 refills | Status: AC

## 2023-04-07 ENCOUNTER — Other Ambulatory Visit: Payer: Self-pay | Admitting: Hematology and Oncology

## 2023-04-07 ENCOUNTER — Telehealth: Payer: Self-pay | Admitting: Hematology and Oncology

## 2023-04-07 NOTE — Telephone Encounter (Signed)
Contacted patient to schedule injection appointments, patient did not answer. A voicemail was left for patient to contact us to schedule appointments.

## 2023-04-08 ENCOUNTER — Encounter: Payer: Self-pay | Admitting: Hematology and Oncology

## 2023-04-08 ENCOUNTER — Other Ambulatory Visit: Payer: Self-pay | Admitting: *Deleted

## 2023-04-17 ENCOUNTER — Inpatient Hospital Stay: Payer: No Typology Code available for payment source | Attending: Hematology and Oncology

## 2023-04-17 ENCOUNTER — Inpatient Hospital Stay: Payer: No Typology Code available for payment source

## 2023-04-17 DIAGNOSIS — C50411 Malignant neoplasm of upper-outer quadrant of right female breast: Secondary | ICD-10-CM | POA: Insufficient documentation

## 2023-04-17 DIAGNOSIS — Z5111 Encounter for antineoplastic chemotherapy: Secondary | ICD-10-CM | POA: Insufficient documentation

## 2023-04-17 DIAGNOSIS — Z95828 Presence of other vascular implants and grafts: Secondary | ICD-10-CM

## 2023-04-17 LAB — CMP (CANCER CENTER ONLY)
ALT: 13 U/L (ref 0–44)
AST: 14 U/L — ABNORMAL LOW (ref 15–41)
Albumin: 4.3 g/dL (ref 3.5–5.0)
Alkaline Phosphatase: 71 U/L (ref 38–126)
Anion gap: 6 (ref 5–15)
BUN: 18 mg/dL (ref 6–20)
CO2: 29 mmol/L (ref 22–32)
Calcium: 9.5 mg/dL (ref 8.9–10.3)
Chloride: 104 mmol/L (ref 98–111)
Creatinine: 0.68 mg/dL (ref 0.44–1.00)
GFR, Estimated: >60 mL/min (ref >60)
Glucose, Bld: 99 mg/dL (ref 70–99)
Potassium: 3.7 mmol/L (ref 3.5–5.1)
Sodium: 139 mmol/L (ref 135–145)
Total Bilirubin: 0.4 mg/dL (ref 0.0–1.2)
Total Protein: 7.2 g/dL (ref 6.5–8.1)

## 2023-04-17 LAB — CBC WITH DIFFERENTIAL (CANCER CENTER ONLY)
Abs Immature Granulocytes: 0.03 10*3/uL (ref 0.00–0.07)
Basophils Absolute: 0 10*3/uL (ref 0.0–0.1)
Basophils Relative: 0 %
Eosinophils Absolute: 0.2 10*3/uL (ref 0.0–0.5)
Eosinophils Relative: 4 %
HCT: 41.1 % (ref 36.0–46.0)
Hemoglobin: 13.5 g/dL (ref 12.0–15.0)
Immature Granulocytes: 1 %
Lymphocytes Relative: 26 %
Lymphs Abs: 1.3 10*3/uL (ref 0.7–4.0)
MCH: 28.2 pg (ref 26.0–34.0)
MCHC: 32.8 g/dL (ref 30.0–36.0)
MCV: 86 fL (ref 80.0–100.0)
Monocytes Absolute: 0.4 10*3/uL (ref 0.1–1.0)
Monocytes Relative: 9 %
Neutro Abs: 3 10*3/uL (ref 1.7–7.7)
Neutrophils Relative %: 60 %
Platelet Count: 234 10*3/uL (ref 150–400)
RBC: 4.78 MIL/uL (ref 3.87–5.11)
RDW: 11.9 % (ref 11.5–15.5)
WBC Count: 5 10*3/uL (ref 4.0–10.5)
nRBC: 0 % (ref 0.0–0.2)

## 2023-04-17 MED ORDER — SODIUM CHLORIDE 0.9% FLUSH
10.0000 mL | Freq: Once | INTRAVENOUS | Status: AC
  Administered 2023-04-17: 10 mL

## 2023-04-17 MED ORDER — GOSERELIN ACETATE 3.6 MG ~~LOC~~ IMPL
3.6000 mg | DRUG_IMPLANT | Freq: Once | SUBCUTANEOUS | Status: AC
  Administered 2023-04-17: 3.6 mg via SUBCUTANEOUS
  Filled 2023-04-17: qty 3.6

## 2023-05-01 ENCOUNTER — Telehealth: Payer: Self-pay | Admitting: Hematology and Oncology

## 2023-05-01 NOTE — Telephone Encounter (Signed)
 Spoke with patient confirming upcoming appointment

## 2023-05-04 ENCOUNTER — Other Ambulatory Visit: Payer: Self-pay | Admitting: Hematology and Oncology

## 2023-05-08 ENCOUNTER — Inpatient Hospital Stay: Payer: No Typology Code available for payment source

## 2023-05-15 ENCOUNTER — Inpatient Hospital Stay: Payer: Self-pay | Attending: Hematology and Oncology

## 2023-05-15 VITALS — BP 122/70 | HR 78 | Temp 98.1°F | Resp 14

## 2023-05-15 DIAGNOSIS — Z79811 Long term (current) use of aromatase inhibitors: Secondary | ICD-10-CM | POA: Insufficient documentation

## 2023-05-15 DIAGNOSIS — Z1721 Progesterone receptor positive status: Secondary | ICD-10-CM | POA: Insufficient documentation

## 2023-05-15 DIAGNOSIS — Z1732 Human epidermal growth factor receptor 2 negative status: Secondary | ICD-10-CM | POA: Insufficient documentation

## 2023-05-15 DIAGNOSIS — C50411 Malignant neoplasm of upper-outer quadrant of right female breast: Secondary | ICD-10-CM | POA: Insufficient documentation

## 2023-05-15 DIAGNOSIS — Z5111 Encounter for antineoplastic chemotherapy: Secondary | ICD-10-CM | POA: Insufficient documentation

## 2023-05-15 DIAGNOSIS — Z95828 Presence of other vascular implants and grafts: Secondary | ICD-10-CM

## 2023-05-15 DIAGNOSIS — Z170 Estrogen receptor positive status [ER+]: Secondary | ICD-10-CM | POA: Insufficient documentation

## 2023-05-15 MED ORDER — GOSERELIN ACETATE 3.6 MG ~~LOC~~ IMPL
3.6000 mg | DRUG_IMPLANT | Freq: Once | SUBCUTANEOUS | Status: AC
  Administered 2023-05-15: 3.6 mg via SUBCUTANEOUS
  Filled 2023-05-15: qty 3.6

## 2023-05-26 ENCOUNTER — Other Ambulatory Visit: Payer: Self-pay | Admitting: Hematology and Oncology

## 2023-06-05 ENCOUNTER — Other Ambulatory Visit: Payer: No Typology Code available for payment source

## 2023-06-05 ENCOUNTER — Ambulatory Visit: Payer: No Typology Code available for payment source

## 2023-06-12 ENCOUNTER — Inpatient Hospital Stay: Attending: Hematology and Oncology

## 2023-06-12 ENCOUNTER — Inpatient Hospital Stay

## 2023-06-12 VITALS — BP 132/72 | HR 72 | Temp 98.5°F | Resp 16

## 2023-06-12 DIAGNOSIS — C50411 Malignant neoplasm of upper-outer quadrant of right female breast: Secondary | ICD-10-CM | POA: Insufficient documentation

## 2023-06-12 DIAGNOSIS — Z95828 Presence of other vascular implants and grafts: Secondary | ICD-10-CM

## 2023-06-12 DIAGNOSIS — Z452 Encounter for adjustment and management of vascular access device: Secondary | ICD-10-CM | POA: Insufficient documentation

## 2023-06-12 DIAGNOSIS — Z5111 Encounter for antineoplastic chemotherapy: Secondary | ICD-10-CM | POA: Insufficient documentation

## 2023-06-12 LAB — CMP (CANCER CENTER ONLY)
ALT: 11 U/L (ref 0–44)
AST: 14 U/L — ABNORMAL LOW (ref 15–41)
Albumin: 4.2 g/dL (ref 3.5–5.0)
Alkaline Phosphatase: 61 U/L (ref 38–126)
Anion gap: 8 (ref 5–15)
BUN: 17 mg/dL (ref 6–20)
CO2: 25 mmol/L (ref 22–32)
Calcium: 8.9 mg/dL (ref 8.9–10.3)
Chloride: 109 mmol/L (ref 98–111)
Creatinine: 0.58 mg/dL (ref 0.44–1.00)
GFR, Estimated: >60 mL/min (ref >60)
Glucose, Bld: 90 mg/dL (ref 70–99)
Potassium: 3.9 mmol/L (ref 3.5–5.1)
Sodium: 142 mmol/L (ref 135–145)
Total Bilirubin: 0.5 mg/dL (ref 0.0–1.2)
Total Protein: 7 g/dL (ref 6.5–8.1)

## 2023-06-12 LAB — CBC WITH DIFFERENTIAL (CANCER CENTER ONLY)
Abs Immature Granulocytes: 0.01 10*3/uL (ref 0.00–0.07)
Basophils Absolute: 0 10*3/uL (ref 0.0–0.1)
Basophils Relative: 0 %
Eosinophils Absolute: 0.2 10*3/uL (ref 0.0–0.5)
Eosinophils Relative: 5 %
HCT: 38.8 % (ref 36.0–46.0)
Hemoglobin: 12.7 g/dL (ref 12.0–15.0)
Immature Granulocytes: 0 %
Lymphocytes Relative: 28 %
Lymphs Abs: 1.3 10*3/uL (ref 0.7–4.0)
MCH: 27.5 pg (ref 26.0–34.0)
MCHC: 32.7 g/dL (ref 30.0–36.0)
MCV: 84.2 fL (ref 80.0–100.0)
Monocytes Absolute: 0.3 10*3/uL (ref 0.1–1.0)
Monocytes Relative: 6 %
Neutro Abs: 2.8 10*3/uL (ref 1.7–7.7)
Neutrophils Relative %: 61 %
Platelet Count: 197 10*3/uL (ref 150–400)
RBC: 4.61 MIL/uL (ref 3.87–5.11)
RDW: 12.2 % (ref 11.5–15.5)
WBC Count: 4.7 10*3/uL (ref 4.0–10.5)
nRBC: 0 % (ref 0.0–0.2)

## 2023-06-12 MED ORDER — HEPARIN SOD (PORK) LOCK FLUSH 100 UNIT/ML IV SOLN
500.0000 [IU] | Freq: Once | INTRAVENOUS | Status: AC
Start: 2023-06-12 — End: 2023-06-12
  Administered 2023-06-12: 500 [IU]

## 2023-06-12 MED ORDER — SODIUM CHLORIDE 0.9% FLUSH
10.0000 mL | Freq: Once | INTRAVENOUS | Status: AC
  Administered 2023-06-12: 10 mL

## 2023-06-12 MED ORDER — GOSERELIN ACETATE 3.6 MG ~~LOC~~ IMPL
3.6000 mg | DRUG_IMPLANT | Freq: Once | SUBCUTANEOUS | Status: AC
  Administered 2023-06-12: 3.6 mg via SUBCUTANEOUS
  Filled 2023-06-12: qty 3.6

## 2023-06-16 ENCOUNTER — Other Ambulatory Visit: Payer: Self-pay | Admitting: Hematology and Oncology

## 2023-06-22 ENCOUNTER — Ambulatory Visit: Payer: Self-pay | Attending: Hematology and Oncology

## 2023-06-22 VITALS — Wt 151.4 lb

## 2023-06-22 DIAGNOSIS — Z483 Aftercare following surgery for neoplasm: Secondary | ICD-10-CM | POA: Insufficient documentation

## 2023-06-22 NOTE — Therapy (Signed)
  OUTPATIENT PHYSICAL THERAPY SOZO SCREENING NOTE   Patient Name: Hannah Murray MRN: [id number] DOB:07-Jan-1976, 48 y.o., female Today's Date: 06/22/2023  PCP: Murleen Arms, MD REFERRING PROVIDER: Murleen Arms, MD   PT End of Session - 06/22/23 0908     Visit Number 2   # unchanged due to screen only   PT Start Time 0906    PT Stop Time 0911    PT Time Calculation (min) 5 min    Activity Tolerance Patient tolerated treatment well    Behavior During Therapy [facility] for tasks assessed/performed             Past Medical History:  Diagnosis Date   Medical history non-contributory    Past Surgical History:  Procedure Laterality Date   BREAST BIOPSY Left 05/16/2021   BREAST BIOPSY Left 07/08/2021   BREAST RECONSTRUCTION WITH PLACEMENT OF TISSUE EXPANDER AND FLEX HD (ACELLULAR HYDRATED DERMIS) Right 09/30/2021   Procedure: BREAST RECONSTRUCTION WITH PLACEMENT OF TISSUE EXPANDER AND FLEX HD (ACELLULAR HYDRATED DERMIS);  Surgeon: Thornell Flirt, DO;  Location: [facility];  Service: Plastics;  Laterality: Right;   MASTECTOMY Right 09/30/2021   MASTECTOMY W/ SENTINEL NODE BIOPSY Right 09/30/2021   Procedure: RIGHT MASTECTOMY WITH SENTINEL LYMPH NODE BIOPSY;  Surgeon: Caralyn Chandler, MD;  Location: [facility];  Service: General;  Laterality: Right;   PORTACATH PLACEMENT Left 11/08/2021   Procedure: INSERTION PORT-A-CATH;  Surgeon: Caralyn Chandler, MD;  Location: [facility];  Service: General;  Laterality: Left;   Patient Active Problem List   Diagnosis Date Noted   Port-A-Cath in place 03/11/2022   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  It measures 4.3 cm and is located in the upper-outer quadrant. She had surgery on 09/30/2021 for Right Mastectomy with tissue expander and  with SLNB with 1/8 positive LN's. Pt started chemotherapy on 11/11/2021. She will also have radiation and chemotherapy.   PRECAUTIONS: right UE Lymphedema risk, None  SUBJECTIVE: Pt returns for her 3 month L-Dex screen.   PAIN:  Are you having pain? No  SOZO SCREENING: Patient was assessed today using the SOZO machine to determine the lymphedema index score. This was compared to her baseline score. It was determined that she is within the recommended range when compared to her baseline and no further action is needed at this time. She will continue SOZO screenings. These are done every 3 months for 2 years post operatively followed by every 6 months for 2 years, and then annually.   L-DEX FLOWSHEETS - 06/22/23 0900       L-DEX LYMPHEDEMA SCREENING   Measurement Type Unilateral    L-DEX MEASUREMENT EXTREMITY Upper Extremity    POSITION  Standing    DOMINANT SIDE Right    At Risk Side Right    BASELINE SCORE (UNILATERAL) -1.7    L-DEX SCORE (UNILATERAL) -4.1    VALUE CHANGE (UNILAT) -2.4               Denyce Flank, PTA 06/22/2023, 9:11 AM

## 2023-07-07 ENCOUNTER — Encounter: Payer: Self-pay | Admitting: Hematology and Oncology

## 2023-07-07 NOTE — Telephone Encounter (Signed)
 Only for patients on treatment plans do they sign the workqueue requests. All supportive plans still require an LOS. Terrel Ferries, RN

## 2023-07-07 NOTE — Telephone Encounter (Signed)
 She is due on 5/18 for zoladex . I can schedule her and reach out to her in an effort to help our understaffed scheduling team. Would you see if an LOS has been sent. Terrel Ferries, RN

## 2023-07-08 ENCOUNTER — Other Ambulatory Visit: Payer: Self-pay | Admitting: Hematology and Oncology

## 2023-07-09 ENCOUNTER — Telehealth: Payer: Self-pay

## 2023-07-09 ENCOUNTER — Other Ambulatory Visit (HOSPITAL_COMMUNITY): Payer: Self-pay

## 2023-07-09 ENCOUNTER — Encounter: Payer: Self-pay | Admitting: Hematology and Oncology

## 2023-07-09 NOTE — Telephone Encounter (Signed)
 Oral Oncology Patient Advocate Encounter   Received notification that patient is due for re-enrollment for assistance for Verzenio  through [facility], [employer]. Patient Assistance Program.   Re-enrollment process has been initiated and will be submitted upon completion of necessary documents.  Rance Burrows' phone number [phone].   I will continue to follow until final determination.   Hansel Ley, CPhT Pharmacy Technician Coordinator [facility] [phone] (Ph) 07/09/2023 10:34 AM

## 2023-07-11 ENCOUNTER — Inpatient Hospital Stay: Attending: Hematology and Oncology

## 2023-07-11 VITALS — BP 139/86 | HR 82 | Temp 98.8°F | Resp 15

## 2023-07-11 DIAGNOSIS — Z1721 Progesterone receptor positive status: Secondary | ICD-10-CM | POA: Insufficient documentation

## 2023-07-11 DIAGNOSIS — C50411 Malignant neoplasm of upper-outer quadrant of right female breast: Secondary | ICD-10-CM | POA: Insufficient documentation

## 2023-07-11 DIAGNOSIS — Z95828 Presence of other vascular implants and grafts: Secondary | ICD-10-CM

## 2023-07-11 DIAGNOSIS — Z5111 Encounter for antineoplastic chemotherapy: Secondary | ICD-10-CM | POA: Insufficient documentation

## 2023-07-11 DIAGNOSIS — Z170 Estrogen receptor positive status [ER+]: Secondary | ICD-10-CM | POA: Insufficient documentation

## 2023-07-11 DIAGNOSIS — Z1732 Human epidermal growth factor receptor 2 negative status: Secondary | ICD-10-CM | POA: Insufficient documentation

## 2023-07-11 MED ORDER — GOSERELIN ACETATE 3.6 MG ~~LOC~~ IMPL
3.6000 mg | DRUG_IMPLANT | Freq: Once | SUBCUTANEOUS | Status: AC
  Administered 2023-07-11: 3.6 mg via SUBCUTANEOUS

## 2023-07-15 ENCOUNTER — Encounter: Payer: Self-pay | Admitting: Hematology and Oncology

## 2023-07-16 ENCOUNTER — Encounter: Payer: Self-pay | Admitting: Hematology and Oncology

## 2023-07-18 ENCOUNTER — Inpatient Hospital Stay

## 2023-08-08 ENCOUNTER — Inpatient Hospital Stay: Attending: Hematology and Oncology

## 2023-08-08 VITALS — BP 126/76 | HR 84 | Temp 97.7°F | Resp 20

## 2023-08-08 DIAGNOSIS — Z5111 Encounter for antineoplastic chemotherapy: Secondary | ICD-10-CM | POA: Insufficient documentation

## 2023-08-08 DIAGNOSIS — Z452 Encounter for adjustment and management of vascular access device: Secondary | ICD-10-CM | POA: Insufficient documentation

## 2023-08-08 DIAGNOSIS — Z95828 Presence of other vascular implants and grafts: Secondary | ICD-10-CM

## 2023-08-08 DIAGNOSIS — C50411 Malignant neoplasm of upper-outer quadrant of right female breast: Secondary | ICD-10-CM | POA: Insufficient documentation

## 2023-08-08 MED ORDER — GOSERELIN ACETATE 3.6 MG ~~LOC~~ IMPL
3.6000 mg | DRUG_IMPLANT | Freq: Once | SUBCUTANEOUS | Status: AC
  Administered 2023-08-08: 3.6 mg via SUBCUTANEOUS
  Filled 2023-08-08: qty 3.6

## 2023-08-08 MED ORDER — HEPARIN SOD (PORK) LOCK FLUSH 100 UNIT/ML IV SOLN
500.0000 [IU] | Freq: Once | INTRAVENOUS | Status: AC
  Administered 2023-08-08: 500 [IU]

## 2023-08-08 MED ORDER — SODIUM CHLORIDE 0.9% FLUSH
10.0000 mL | Freq: Once | INTRAVENOUS | Status: AC
  Administered 2023-08-08: 10 mL

## 2023-08-24 NOTE — Telephone Encounter (Signed)
 Oral Oncology Patient Advocate Encounter  Reached out and spoke with patient regarding PAP paperwork, explained that I would send it to their preferred email via [employer].   Confirmed email address: [email or web address] .    Patient expressed understanding and consent.  Will follow up once paperwork has been signed and returned.   Estefana Sox, CPhT-Adv Oncology Pharmacy Patient Advocate [facility]  Direct Number: [phone]  Fax: [phone]

## 2023-08-24 NOTE — Telephone Encounter (Signed)
 Oral Oncology Patient Advocate Encounter  Left vm with patient to notify that new signatures are needed to renew her [employer] enrollment for Verzenio   Hannah Murray, CPhT-Adv Oncology Pharmacy Patient Advocate [facility]  Direct Number: [phone]  Fax: [phone]

## 2023-08-25 ENCOUNTER — Other Ambulatory Visit: Payer: Self-pay | Admitting: Obstetrics and Gynecology

## 2023-08-25 DIAGNOSIS — Z1231 Encounter for screening mammogram for malignant neoplasm of breast: Secondary | ICD-10-CM

## 2023-08-25 NOTE — Telephone Encounter (Signed)
 Oral Oncology Patient Advocate Encounter  Signature have been received from patient. Application is now pending MD signatures.  Hannah Murray, CPhT-Adv Oncology Pharmacy Patient Advocate [facility]  Direct Number: [phone]  Fax: [phone]

## 2023-09-01 ENCOUNTER — Encounter: Payer: Self-pay | Admitting: Hematology and Oncology

## 2023-09-12 ENCOUNTER — Inpatient Hospital Stay: Payer: Self-pay | Attending: Hematology and Oncology

## 2023-09-12 VITALS — BP 125/78 | HR 89 | Temp 98.6°F | Resp 20

## 2023-09-12 DIAGNOSIS — Z1732 Human epidermal growth factor receptor 2 negative status: Secondary | ICD-10-CM | POA: Insufficient documentation

## 2023-09-12 DIAGNOSIS — C50411 Malignant neoplasm of upper-outer quadrant of right female breast: Secondary | ICD-10-CM | POA: Insufficient documentation

## 2023-09-12 DIAGNOSIS — Z170 Estrogen receptor positive status [ER+]: Secondary | ICD-10-CM | POA: Insufficient documentation

## 2023-09-12 DIAGNOSIS — Z95828 Presence of other vascular implants and grafts: Secondary | ICD-10-CM

## 2023-09-12 DIAGNOSIS — Z1721 Progesterone receptor positive status: Secondary | ICD-10-CM | POA: Insufficient documentation

## 2023-09-12 DIAGNOSIS — Z5111 Encounter for antineoplastic chemotherapy: Secondary | ICD-10-CM | POA: Insufficient documentation

## 2023-09-12 MED ORDER — GOSERELIN ACETATE 3.6 MG ~~LOC~~ IMPL
3.6000 mg | DRUG_IMPLANT | Freq: Once | SUBCUTANEOUS | Status: AC
  Administered 2023-09-12: 3.6 mg via SUBCUTANEOUS

## 2023-09-18 NOTE — Telephone Encounter (Signed)
 Oral Oncology Patient Advocate Encounter  I received notification from [employer] PAP patient has been re-approved  to receive Verzenio   for an additional 12 months at no charge.

## 2023-09-21 ENCOUNTER — Ambulatory Visit: Attending: Hematology and Oncology

## 2023-09-21 VITALS — Wt 151.4 lb

## 2023-09-21 DIAGNOSIS — Z483 Aftercare following surgery for neoplasm: Secondary | ICD-10-CM | POA: Insufficient documentation

## 2023-09-21 NOTE — Therapy (Signed)
  OUTPATIENT PHYSICAL THERAPY SOZO SCREENING NOTE   Patient Name: Hannah Murray MRN: [id number] DOB:[birth year], 48 y.o., female Today's Date: 09/21/2023  PCP: Loretha Ash, MD REFERRING PROVIDER: Loretha Ash, MD   PT End of Session - 09/21/23 0919     Visit Number 2   # unchanged due to screen only   PT Start Time 0918    PT Stop Time 0922    PT Time Calculation (min) 4 min    Activity Tolerance Patient tolerated treatment well    Behavior During Therapy [facility] for tasks assessed/performed          Past Medical History:  Diagnosis Date   Medical history non-contributory    Past Surgical History:  Procedure Laterality Date   BREAST BIOPSY Left 05/16/2021   BREAST BIOPSY Left 07/08/2021   BREAST RECONSTRUCTION WITH PLACEMENT OF TISSUE EXPANDER AND FLEX HD (ACELLULAR HYDRATED DERMIS) Right 09/30/2021   Procedure: BREAST RECONSTRUCTION WITH PLACEMENT OF TISSUE EXPANDER AND FLEX HD (ACELLULAR HYDRATED DERMIS);  Surgeon: Lowery Estefana RAMAN, DO;  Location: [facility];  Service: Plastics;  Laterality: Right;   MASTECTOMY Right 09/30/2021   MASTECTOMY W/ SENTINEL NODE BIOPSY Right 09/30/2021   Procedure: RIGHT MASTECTOMY WITH SENTINEL LYMPH NODE BIOPSY;  Surgeon: Curvin Deward MOULD, MD;  Location: [facility];  Service: General;  Laterality: Right;   PORTACATH PLACEMENT Left 11/08/2021   Procedure: INSERTION PORT-A-CATH;  Surgeon: Curvin Deward MOULD, MD;  Location: [facility];  Service: General;  Laterality: Left;   Patient Active Problem List   Diagnosis Date Noted   Port-A-Cath in place 03/11/2022   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 It  measures 4.3 cm and is located in the upper-outer quadrant. She had surgery on 09/30/2021 for Right Mastectomy with tissue expander and  with SLNB with 1/8 positive LN's. Pt started chemotherapy on 11/11/2021. She will also have radiation and chemotherapy.   PRECAUTIONS: right UE Lymphedema risk, None  SUBJECTIVE: Pt returns for her last 3 month L-Dex screen.   PAIN:  Are you having pain? No  SOZO SCREENING: Patient was assessed today using the SOZO machine to determine the lymphedema index score. This was compared to her baseline score. It was determined that she is within the recommended range when compared to her baseline and no further action is needed at this time. She will continue SOZO screenings. These are done every 3 months for 2 years post operatively followed by every 6 months for 2 years, and then annually.   L-DEX FLOWSHEETS - 09/21/23 0900       L-DEX LYMPHEDEMA SCREENING   Measurement Type Unilateral    L-DEX MEASUREMENT EXTREMITY Upper Extremity    POSITION  Standing    DOMINANT SIDE Right    At Risk Side Right    BASELINE SCORE (UNILATERAL) -1.7    L-DEX SCORE (UNILATERAL) -4.2    VALUE CHANGE (UNILAT) -2.5            Aden Berwyn Caldron, PTA 09/21/2023, 9:22 AM

## 2023-09-24 ENCOUNTER — Other Ambulatory Visit: Payer: Self-pay | Admitting: *Deleted

## 2023-09-24 DIAGNOSIS — M25511 Pain in right shoulder: Secondary | ICD-10-CM

## 2023-09-24 DIAGNOSIS — C50411 Malignant neoplasm of upper-outer quadrant of right female breast: Secondary | ICD-10-CM

## 2023-09-25 ENCOUNTER — Ambulatory Visit: Payer: Self-pay | Admitting: Rehabilitation

## 2023-09-25 NOTE — Therapy (Deleted)
 OUTPATIENT PHYSICAL THERAPY  UPPER EXTREMITY ONCOLOGY EVALUATION  Patient Name: Hannah Murray MRN: [id number] DOB:1975/09/06, 48 y.o., female Today's Date: 09/25/2023  END OF SESSION:   Past Medical History:  Diagnosis Date   Medical history non-contributory    Past Surgical History:  Procedure Laterality Date   BREAST BIOPSY Left 05/16/2021   BREAST BIOPSY Left 07/08/2021   BREAST RECONSTRUCTION WITH PLACEMENT OF TISSUE EXPANDER AND FLEX HD (ACELLULAR HYDRATED DERMIS) Right 09/30/2021   Procedure: BREAST RECONSTRUCTION WITH PLACEMENT OF TISSUE EXPANDER AND FLEX HD (ACELLULAR HYDRATED DERMIS);  Surgeon: Lowery Estefana RAMAN, DO;  Location: [facility];  Service: Plastics;  Laterality: Right;   MASTECTOMY Right 09/30/2021   MASTECTOMY W/ SENTINEL NODE BIOPSY Right 09/30/2021   Procedure: RIGHT MASTECTOMY WITH SENTINEL LYMPH NODE BIOPSY;  Surgeon: Curvin Deward MOULD, MD;  Location: [facility];  Service: General;  Laterality: Right;   PORTACATH PLACEMENT Left 11/08/2021   Procedure: INSERTION PORT-A-CATH;  Surgeon: Curvin Deward MOULD, MD;  Location: [facility];  Service: General;  Laterality: Left;   Patient Active Problem List   Diagnosis Date Noted   Port-A-Cath in place 03/11/2022   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  ONSET DATE: 05/21/21  Rationale for Evaluation and Treatment: Rehabilitation  SUBJECTIVE:                                                                                                                                                                                           SUBJECTIVE STATEMENT: ***  PERTINENT HISTORY: 09/30/2021 Right Mastectomy with SLNB with 1/8 positive LN's. Pt completed chemotherapy and radiation.   PAIN:  Are you having pain?  {yes/no:20286} NPRS scale: ***/10 Pain location: *** Pain orientation: {Pain Orientation:25161}  PAIN TYPE: {type:313116} Pain description: {PAIN DESCRIPTION:21022940}  Aggravating factors: *** Relieving factors: ***  PRECAUTIONS: {Therapy precautions:24002}  RED FLAGS: {PT Red Flags:29287}   WEIGHT BEARING RESTRICTIONS: {Yes ***/No:24003}  FALLS:  Has patient fallen in last 6 months? {fallsyesno:27318}  LIVING ENVIRONMENT: Lives with: {[facility] lives with:25569::lives with their family} Lives in: {Lives in:25570} Stairs: {yes/no:20286}; {Stairs:24000} Has following equipment at home: {Assistive devices:23999}  OCCUPATION: ***  LEISURE: ***  HAND DOMINANCE: {RIGHT/LEFT:21944}   PRIOR LEVEL OF FUNCTION: {PLOF:24004}  PATIENT GOALS: ***   OBJECTIVE: Note: Objective measures were completed at Evaluation unless otherwise noted.  COGNITION: Overall cognitive status: {cognition:24006}   PALPATION: ***  OBSERVATIONS / OTHER ASSESSMENTS: ***  SENSATION: Light touch: {intact/deficits:24005} Stereognosis: {intact/deficits:24005}  Hot/Cold: {intact/deficits:24005} Proprioception: {intact/deficits:24005}  POSTURE: ***  UPPER EXTREMITY AROM/PROM:  A/PROM RIGHT   baseline   Shoulder extension 54  Shoulder flexion 163  Shoulder abduction 175  Shoulder internal rotation 62  Shoulder ER 100    (Blank rows = not tested)  A/PROM LEFT   baseline  Shoulder extension 65  Shoulder flexion 165  Shoulder abduction 180  Shoulder internal rotation 65  Shoulder external rotation 104    (Blank rows = not tested)  UPPER EXTREMITY STRENGTH:   LYMPHEDEMA ASSESSMENTS: doing SOZO screenings regularly   QUICK DASH SURVEY: ***                                                                                                                            TREATMENT DATE: ***    PATIENT EDUCATION:  Education details: *** Person educated: {Person educated:25204} Education  method: {Education Method:25205} Education comprehension: {Education Comprehension:25206}  HOME EXERCISE PROGRAM: ***  ASSESSMENT:  CLINICAL IMPRESSION: Patient is a *** y.o. *** who was seen today for physical therapy evaluation and treatment for ***.    OBJECTIVE IMPAIRMENTS: {opptimpairments:25111}.   ACTIVITY LIMITATIONS: {activitylimitations:27494}  PARTICIPATION LIMITATIONS: {participationrestrictions:25113}  PERSONAL FACTORS: {Personal factors:25162} are also affecting patient's functional outcome.   REHAB POTENTIAL: {rehabpotential:25112}  CLINICAL DECISION MAKING: {clinical decision making:25114}  EVALUATION COMPLEXITY: {Evaluation complexity:25115}  GOALS: Goals reviewed with patient? {yes/no:20286}  SHORT TERM GOALS: Target date: ***  *** Baseline: Goal status: INITIAL  2.  *** Baseline:  Goal status: INITIAL  3.  *** Baseline:  Goal status: INITIAL  4.  *** Baseline:  Goal status: INITIAL  5.  *** Baseline:  Goal status: INITIAL  6.  *** Baseline:  Goal status: INITIAL  LONG TERM GOALS: Target date: ***  *** Baseline:  Goal status: INITIAL  2.  *** Baseline:  Goal status: INITIAL  3.  *** Baseline:  Goal status: INITIAL  4.  *** Baseline:  Goal status: INITIAL  5.  *** Baseline:  Goal status: INITIAL  6.  *** Baseline:  Goal status: INITIAL  PLAN:  PT FREQUENCY: {rehab frequency:25116}  PT DURATION: {rehab duration:25117}  PLANNED INTERVENTIONS: {rehab planned interventions:25118::Patient/Family education,Balance training,Joint mobilization,Therapeutic exercises,Therapeutic activity,Neuromuscular re-education,Gait training,Self Care}  PLAN FOR NEXT SESSION: PIERRETTE Larue Saddie JONELLE, PT 09/25/2023, 8:46 AM

## 2023-09-28 ENCOUNTER — Other Ambulatory Visit: Payer: Self-pay

## 2023-09-28 ENCOUNTER — Ambulatory Visit: Payer: Self-pay | Attending: Hematology and Oncology | Admitting: Rehabilitation

## 2023-09-28 ENCOUNTER — Encounter: Payer: Self-pay | Admitting: Rehabilitation

## 2023-09-28 DIAGNOSIS — M25511 Pain in right shoulder: Secondary | ICD-10-CM | POA: Insufficient documentation

## 2023-09-28 DIAGNOSIS — C50411 Malignant neoplasm of upper-outer quadrant of right female breast: Secondary | ICD-10-CM | POA: Insufficient documentation

## 2023-09-28 DIAGNOSIS — Z9189 Other specified personal risk factors, not elsewhere classified: Secondary | ICD-10-CM | POA: Insufficient documentation

## 2023-09-28 DIAGNOSIS — Z483 Aftercare following surgery for neoplasm: Secondary | ICD-10-CM | POA: Insufficient documentation

## 2023-09-28 NOTE — Therapy (Signed)
 OUTPATIENT PHYSICAL THERAPY  UPPER EXTREMITY ONCOLOGY EVALUATION  Patient Name: Hannah Murray MRN: [id number] DOB:04/12/1975, 48 y.o., female Today's Date: 09/28/2023  END OF SESSION:  PT End of Session - 09/28/23 1556     Visit Number 1    Number of Visits 1    PT Start Time 1405    PT Stop Time 1450    PT Time Calculation (min) 45 min    Activity Tolerance Patient tolerated treatment well    Behavior During Therapy [facility] for tasks assessed/performed          Past Medical History:  Diagnosis Date   Medical history non-contributory    Past Surgical History:  Procedure Laterality Date   BREAST BIOPSY Left 05/16/2021   BREAST BIOPSY Left 07/08/2021   BREAST RECONSTRUCTION WITH PLACEMENT OF TISSUE EXPANDER AND FLEX HD (ACELLULAR HYDRATED DERMIS) Right 09/30/2021   Procedure: BREAST RECONSTRUCTION WITH PLACEMENT OF TISSUE EXPANDER AND FLEX HD (ACELLULAR HYDRATED DERMIS);  Surgeon: Lowery Estefana RAMAN, DO;  Location: [facility];  Service: Plastics;  Laterality: Right;   MASTECTOMY Right 09/30/2021   MASTECTOMY W/ SENTINEL NODE BIOPSY Right 09/30/2021   Procedure: RIGHT MASTECTOMY WITH SENTINEL LYMPH NODE BIOPSY;  Surgeon: Curvin Deward MOULD, MD;  Location: [facility];  Service: General;  Laterality: Right;   PORTACATH PLACEMENT Left 11/08/2021   Procedure: INSERTION PORT-A-CATH;  Surgeon: Curvin Deward MOULD, MD;  Location: [facility];  Service: General;  Laterality: Left;   Patient Active Problem List   Diagnosis Date Noted   Port-A-Cath in place 03/11/2022   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, right (HCC) 06/11/2021    REFERRING PROVIDER: Amber Stalls, MD  REFERRING DIAG: Rt shoulder pain   THERAPY DIAG:  Aftercare following surgery for neoplasm  Malignant neoplasm of upper-outer quadrant of right female breast, unspecified estrogen receptor status (HCC)  At risk for lymphedema  ONSET DATE:  05/21/21  Rationale for Evaluation and Treatment: Rehabilitation  SUBJECTIVE:                                                                                                                                                                                           SUBJECTIVE STATEMENT: On last Monday I noticed that the Rt armpit was swollen.  That day I was lifting weights for the first time.  4# - 10# 15 of each.  Up until that it was moving well.    PERTINENT HISTORY: 09/30/2021 Right Mastectomy with SLNB with 1/8 positive LN's. Pt completed chemotherapy and radiation.   PAIN:  Are you having pain? Yes with movement only but  not recently  NPRS scale: 3/10 Pain location: Rt axilla  Pain orientation: Right  PAIN TYPE: throbbing Pain description: intermittent  Aggravating factors: lifting the arm out to the side  Relieving factors: just not using it   PRECAUTIONS: lymphedema risk   RED FLAGS: None   WEIGHT BEARING RESTRICTIONS: No  FALLS:  Has patient fallen in last 6 months? 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  COGNITION: Overall cognitive status: Within functional limits for tasks assessed   PALPATION: No ttp; no cording noted  POSTURE: WNL  UPPER EXTREMITY AROM/PROM:  A/PROM RIGHT   baseline  09/28/23  Shoulder extension 54   Shoulder flexion 163 155  Shoulder abduction 175 168  Shoulder internal rotation 62   Shoulder ER 100     (Blank rows = not tested)  A/PROM LEFT   baseline  Shoulder extension 65  Shoulder flexion 165  Shoulder abduction 180  Shoulder internal rotation 65  Shoulder external rotation 104    (Blank rows = not tested)  UPPER EXTREMITY  STRENGTH: WNL  LYMPHEDEMA ASSESSMENTS: doing SOZO screenings regularly - redid it today due to new symptoms and it was still WNL in green L-DEX LYMPHEDEMA SCREENING Measurement Type: Unilateral L-DEX MEASUREMENT EXTREMITY: Upper Extremity POSITION : Standing DOMINANT SIDE: Right At Risk Side: Right BASELINE SCORE (UNILATERAL): -1.7 L-DEX SCORE (UNILATERAL): -6.1 VALUE CHANGE (UNILAT): -4.4   QUICK DASH SURVEY: 15% from 18%                                                                                                                            TREATMENT DATE:  Eval performed Measured pt for sleeve and sent order link via abilico to email - pt will self order Instruction for use with exercise and reviewed how to progress low and slow     PATIENT EDUCATION:  Education details: per today's note Person educated: Patient Education method: Medical illustrator Education comprehension: verbalized understanding  HOME EXERCISE PROGRAM:   ASSESSMENT:  CLINICAL IMPRESSION: Patient is a 48 y.o. female who was seen today for physical therapy evaluation and treatment for new onset of Rt axilla puffiness and pain after starting to lift weights.  Pt starting back to lifting for the first time after surgery but went back with anywhere from 4-10# and up and did a few sets of 15 of each.  Pt reports it has settled back down at this time.  She used to have a sleeve but lost it and would like to get a new one.     OBJECTIVE IMPAIRMENTS: decreased knowledge of condition, decreased knowledge of use of DME, increased edema, and pain.  ACTIVITY LIMITATIONS: lifting  PARTICIPATION LIMITATIONS: community activity  PERSONAL FACTORS: 1-2 comorbidities: SLNB, radiation to axilla  are also affecting patient's functional outcome.   REHAB POTENTIAL: Excellent  CLINICAL DECISION MAKING: Stable/uncomplicated  EVALUATION COMPLEXITY: Low  GOALS: Goals reviewed with patient? Yes  SHORT  TERM GOALS: Target date: 09/28/23  Pt will know where to order a new compression sleeve and gauntlet Baseline: Goal status: MET  2.  Pt will be educated on return to weights after mastectomy  Baseline:  Goal status: MET    PLAN:  PT FREQUENCY: one time visit  PT DURATION: 1 week  PLANNED INTERVENTIONS: 97164- PT Re-evaluation, 97110-Therapeutic exercises, 97535- Self Care, Patient/Family education, Balance training, Joint mobilization, Therapeutic exercises, Therapeutic activity, Neuromuscular re-education, Gait training, and Self Care  PLAN FOR NEXT SESSION: CATHAY Larue Saddie JONELLE, PT 09/28/2023, 3:57 PM  PHYSICAL THERAPY DISCHARGE SUMMARY  Visits from Start of Care: 1  Current functional level related to goals / functional outcomes: See above   Remaining deficits: Lymphedema risk    Education / Equipment: Self care plan, where to get sleeve   Plan: Patient agrees to discharge.  Patient is being discharged due to meeting the stated rehab goals.

## 2023-10-08 ENCOUNTER — Encounter: Payer: Self-pay | Admitting: Hematology and Oncology

## 2023-10-10 ENCOUNTER — Inpatient Hospital Stay: Payer: Self-pay | Attending: Hematology and Oncology

## 2023-10-10 VITALS — BP 130/81 | HR 71 | Temp 97.3°F | Resp 17

## 2023-10-10 DIAGNOSIS — Z5111 Encounter for antineoplastic chemotherapy: Secondary | ICD-10-CM | POA: Insufficient documentation

## 2023-10-10 DIAGNOSIS — Z1721 Progesterone receptor positive status: Secondary | ICD-10-CM | POA: Insufficient documentation

## 2023-10-10 DIAGNOSIS — Z95828 Presence of other vascular implants and grafts: Secondary | ICD-10-CM

## 2023-10-10 DIAGNOSIS — Z1732 Human epidermal growth factor receptor 2 negative status: Secondary | ICD-10-CM | POA: Insufficient documentation

## 2023-10-10 DIAGNOSIS — Z170 Estrogen receptor positive status [ER+]: Secondary | ICD-10-CM | POA: Insufficient documentation

## 2023-10-10 DIAGNOSIS — C50411 Malignant neoplasm of upper-outer quadrant of right female breast: Secondary | ICD-10-CM | POA: Insufficient documentation

## 2023-10-10 MED ORDER — GOSERELIN ACETATE 3.6 MG ~~LOC~~ IMPL
3.6000 mg | DRUG_IMPLANT | Freq: Once | SUBCUTANEOUS | Status: AC
Start: 2023-10-10 — End: 2023-10-10
  Administered 2023-10-10: 3.6 mg via SUBCUTANEOUS
  Filled 2023-10-10: qty 3.6

## 2023-10-10 MED ORDER — SODIUM CHLORIDE 0.9% FLUSH: 10.0000 mL | Freq: Once | INTRAVENOUS | Status: DC

## 2023-10-10 MED ORDER — HEPARIN SOD (PORK) LOCK FLUSH 100 UNIT/ML IV SOLN: 500.0000 [IU] | Freq: Once | INTRAVENOUS | Status: DC

## 2023-10-14 ENCOUNTER — Telehealth: Payer: Self-pay | Admitting: Hematology and Oncology

## 2023-10-14 NOTE — Telephone Encounter (Signed)
 called Interpreter who left vm for pt to call back to schedule monthly injections and f/u

## 2023-11-11 ENCOUNTER — Inpatient Hospital Stay: Payer: Self-pay

## 2023-11-11 ENCOUNTER — Inpatient Hospital Stay: Payer: Self-pay | Admitting: Hematology and Oncology

## 2023-11-11 ENCOUNTER — Inpatient Hospital Stay: Payer: Self-pay | Attending: Hematology and Oncology

## 2023-11-11 ENCOUNTER — Inpatient Hospital Stay (HOSPITAL_BASED_OUTPATIENT_CLINIC_OR_DEPARTMENT_OTHER): Admitting: Hematology and Oncology

## 2023-11-11 VITALS — BP 126/63 | HR 76 | Temp 97.9°F | Resp 14 | Wt 151.1 lb

## 2023-11-11 DIAGNOSIS — C50411 Malignant neoplasm of upper-outer quadrant of right female breast: Secondary | ICD-10-CM

## 2023-11-11 DIAGNOSIS — Z1732 Human epidermal growth factor receptor 2 negative status: Secondary | ICD-10-CM | POA: Insufficient documentation

## 2023-11-11 DIAGNOSIS — Z95828 Presence of other vascular implants and grafts: Secondary | ICD-10-CM

## 2023-11-11 DIAGNOSIS — Z170 Estrogen receptor positive status [ER+]: Secondary | ICD-10-CM | POA: Insufficient documentation

## 2023-11-11 DIAGNOSIS — Z1721 Progesterone receptor positive status: Secondary | ICD-10-CM | POA: Insufficient documentation

## 2023-11-11 DIAGNOSIS — Z5111 Encounter for antineoplastic chemotherapy: Secondary | ICD-10-CM | POA: Insufficient documentation

## 2023-11-11 LAB — CBC WITH DIFFERENTIAL/PLATELET
Abs Immature Granulocytes: 0.01 K/uL (ref 0.00–0.07)
Basophils Absolute: 0 K/uL (ref 0.0–0.1)
Basophils Relative: 1 %
Eosinophils Absolute: 0.2 K/uL (ref 0.0–0.5)
Eosinophils Relative: 3 %
HCT: 42.1 % (ref 36.0–46.0)
Hemoglobin: 13.5 g/dL (ref 12.0–15.0)
Immature Granulocytes: 0 %
Lymphocytes Relative: 25 %
Lymphs Abs: 1.5 K/uL (ref 0.7–4.0)
MCH: 27.3 pg (ref 26.0–34.0)
MCHC: 32.1 g/dL (ref 30.0–36.0)
MCV: 85.2 fL (ref 80.0–100.0)
Monocytes Absolute: 0.4 K/uL (ref 0.1–1.0)
Monocytes Relative: 7 %
Neutro Abs: 3.8 K/uL (ref 1.7–7.7)
Neutrophils Relative %: 64 %
Platelets: 235 K/uL (ref 150–400)
RBC: 4.94 MIL/uL (ref 3.87–5.11)
RDW: 12.6 % (ref 11.5–15.5)
WBC: 5.8 K/uL (ref 4.0–10.5)
nRBC: 0 % (ref 0.0–0.2)

## 2023-11-11 LAB — CMP (CANCER CENTER ONLY)
ALT: 11 U/L (ref 0–44)
AST: 14 U/L — ABNORMAL LOW (ref 15–41)
Albumin: 4.7 g/dL (ref 3.5–5.0)
Alkaline Phosphatase: 62 U/L (ref 38–126)
Anion gap: 6 (ref 5–15)
BUN: 17 mg/dL (ref 6–20)
CO2: 29 mmol/L (ref 22–32)
Calcium: 9.5 mg/dL (ref 8.9–10.3)
Chloride: 104 mmol/L (ref 98–111)
Creatinine: 0.65 mg/dL (ref 0.44–1.00)
GFR, Estimated: >60 mL/min
Glucose, Bld: 99 mg/dL (ref 70–99)
Potassium: 4.3 mmol/L (ref 3.5–5.1)
Sodium: 139 mmol/L (ref 135–145)
Total Bilirubin: 0.7 mg/dL (ref 0.0–1.2)
Total Protein: 7.7 g/dL (ref 6.5–8.1)

## 2023-11-11 MED ORDER — GOSERELIN ACETATE 3.6 MG ~~LOC~~ IMPL
3.6000 mg | DRUG_IMPLANT | Freq: Once | SUBCUTANEOUS | Status: AC
  Administered 2023-11-11: 3.6 mg via SUBCUTANEOUS
  Filled 2023-11-11: qty 3.6

## 2023-11-11 NOTE — Progress Notes (Signed)
 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 Indeterminate mass in the right breast at 12:30, which may represent a fibroadenoma. No evidence of right axillary adenopathy.   05/16/2021 Pathology Results   Right breast 10 0 clock mass biopsy showed invasive mammary carcinoma, ductal phenotype prognostics include ER 95% strong staining intensity PR 95% strong staining intensity Ki-67 of 40% and HER2 negative   06/11/2021 Cancer Staging   Staging form: Breast, AJCC 8th Edition - Pathologic: Stage IIA (pT2, pN1a, cM0, G3, ER+, PR+, HER2-, Oncotype DX score: 24) - Signed by Loretha Ash, MD on 11/04/2021 Multigene prognostic tests performed: Oncotype DX Recurrence score range: Greater than or equal to 11 Histologic grading system: 3 grade system   07/10/2021 Genetic Testing   Negative hereditary cancer genetic testing: no pathogenic variants detected [facility] Breast STAT Panel or Common Hereditary Cancers +RNA Panel.  Report dates are Jul 10, 2021 and Jul 18, 2021.   The STAT Breast cancer panel offered by [facility] includes sequencing and rearrangement analysis for the following 9 genes:  ATM, BRCA1, BRCA2, CDH1, CHEK2, PALB2, PTEN, STK11 and TP53.   The Common Hereditary Cancers + RNA Panel offered by [facility] includes sequencing, deletion/duplication, and RNA testing of the following 47 genes: APC, ATM, AXIN2, BARD1, BMPR1A, BRCA1, BRCA2, BRIP1, CDH1, CDK4*, CDKN2A (p14ARF)*, CDKN2A (p16INK4a)*, CHEK2, CTNNA1, DICER1, EPCAM (Deletion/duplication testing only), GREM1 (promoter region deletion/duplication testing only), KIT, MEN1, MLH1, MSH2, MSH3, MSH6, MUTYH, NBN, NF1, NHTL1, PALB2, PDGFRA*, PMS2, POLD1, POLE, PTEN, RAD50, RAD51C, RAD51D, SDHB, SDHC, SDHD, SMAD4, SMARCA4. STK11, TP53, TSC1,  TSC2, and VHL.  The following genes were evaluated for sequence changes only: SDHA and HOXB13 c.251G>A variant only.  RNA analysis is not performed for the * genes.     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  Right axillary lymph node evaluation showed 1 out of 8 lymph nodes positive for malignancy., final pathologic staging T2N1A.    11/11/2021 - 03/24/2022 Chemotherapy   Patient is on Treatment Plan : BREAST ADJUVANT DOSE DENSE AC q14d / PACLitaxel  q7d     05/01/2022 - 06/16/2022 Radiation Therapy   First Treatment Date: 2022-05-01 - Last Treatment Date: 2022-06-16   Plan Name: CW_R_BO Site: Chest Wall, Right Technique: 3D Mode: Photon Dose Per Fraction: 1.8 Gy Prescribed Dose (Delivered / Prescribed): 25.2 Gy / 25.2 Gy Prescribed Fxs (Delivered / Prescribed): 14 / 14   Plan Name: CW_R_PAB_SCV Site: Chest Wall, Right Technique: 3D Mode: Photon Dose Per Fraction: 1.8 Gy Prescribed Dose (Delivered / Prescribed): 50.4 Gy / 50.4 Gy Prescribed Fxs (Delivered / Prescribed): 28 / 28   Plan Name: CW_R_Bst_BO Site: Chest Wall, Right Technique: Electron Mode: Electron Dose Per Fraction: 2 Gy Prescribed Dose (Delivered / Prescribed): 10 Gy / 10 Gy Prescribed Fxs (Delivered / Prescribed): 5 / 5   Plan Name: CW_R Site: Chest Wall, Right Technique: 3D Mode: Photon Dose Per Fraction: 1.8 Gy Prescribed Dose (Delivered / Prescribed): 25.2 Gy / 25.2 Gy Prescribed Fxs (Delivered / Prescribed): 14 / 14   06/30/2022 -  Anti-estrogen oral therapy   Zoladex  injection every 4 weeks, and Anastrozole  beginning  06/30/2022 Verzenio  BID beginning at the end of May/early June, 2024     INTERVAL HISTORY:   Discussed the use of AI scribe software for clinical note transcription with the patient, who gave verbal consent to  proceed.  History of Present Illness    Certified spanish interpreter present during the entirety of conversation.  The patient, who is currently being treated for breast cancer with Verzenio  and anastrozole  with OFS, here for follow up.  Discussed the use of AI scribe software for clinical note transcription with the patient, who gave verbal consent to proceed.  History of Present Illness Hannah Murray is a 48 year old female with a history of mastectomy and radiation therapy who presents with tightness and pulling sensation in the armpit.  About three weeks ago, she attended physical therapy where she was informed that everything appeared normal. However, approximately one week ago, she began experiencing a sensation of tightness and a pulling feeling in her right armpit, described as feeling like a 'very long string'. This sensation is more noticeable when wearing a sleeve.  She has a history of mastectomy and radiation on that side. She is currently taking two pills and receives an injection, with the next injection scheduled for today. She experiences body pain and fatigue as side effects of the medication, but she tries to stay active to mitigate these effects.  She has been engaging in walking on a treadmill and using a stationary bicycle at home. She has noticed improvement in her arm tightness with stretching exercises, which she started after researching her symptoms and identifying them as possibly related to 'cording'.  She has reduced her running but continues to maintain physical activity through walking and cycling at home. No changes in breathing.  Rest of the pertinent 10 point ROS reviewed and neg.  PAST MEDICAL/SURGICAL HISTORY:  Past Medical History:  Diagnosis Date   Medical history non-contributory    Past Surgical History:  Procedure Laterality Date   BREAST BIOPSY Left 05/16/2021   BREAST BIOPSY Left 07/08/2021   BREAST RECONSTRUCTION WITH PLACEMENT OF TISSUE  EXPANDER AND FLEX HD (ACELLULAR HYDRATED DERMIS) Right 09/30/2021   Procedure: BREAST RECONSTRUCTION WITH PLACEMENT OF TISSUE EXPANDER AND FLEX HD (ACELLULAR HYDRATED DERMIS);  Surgeon: Lowery Estefana RAMAN, DO;  Location: [facility];  Service: Plastics;  Laterality: Right;   MASTECTOMY Right 09/30/2021   MASTECTOMY W/ SENTINEL NODE BIOPSY Right 09/30/2021   Procedure: RIGHT MASTECTOMY WITH SENTINEL LYMPH NODE BIOPSY;  Surgeon: Curvin Deward MOULD, MD;  Location: [facility];  Service: General;  Laterality: Right;   PORTACATH PLACEMENT Left 11/08/2021   Procedure: INSERTION PORT-A-CATH;  Surgeon: Curvin Deward MOULD, MD;  Location: [facility];  Service: General;  Laterality: Left;     ALLERGIES:  No Known Allergies   CURRENT MEDICATIONS:  Outpatient Encounter Medications as of 11/11/2023  Medication Sig   anastrozole  (ARIMIDEX ) 1 MG tablet Take 1 tablet (1 mg total) by mouth daily.   VERZENIO  150 MG tablet TAKE 1 TABLET BY MOUTH TWICE DAILY   loperamide (IMODIUM A-D) 2 MG tablet Take 2 mg by mouth as needed for diarrhea or loose stools. Take 2 tabs (4 mg) with first loose stool, then 1 tab (2 mg) with each additional loose stool. Do not take more than 8 tabs (16 mg) in a 24-hour period. (Patient not taking: Reported on 11/11/2023)   Facility-Administered Encounter Medications as of 11/11/2023  Medication   goserelin (ZOLADEX ) injection 3.6 mg  ONCOLOGIC FAMILY HISTORY:  Family History  Problem Relation Age of Onset   Hypertension Father    Diabetes Father    Thyroid disease Father    Ovarian cancer Other        MGM's niece; d. > 50     SOCIAL HISTORY:  Social History   Socioeconomic History   Marital status: Single    Spouse name: Not on file   Number of children: 1   Years of education: Not on file   Highest education level: High school graduate  Occupational History   Not on file  Tobacco Use   Smoking status: Never   Smokeless  tobacco: Never  Vaping Use   Vaping status: Never Used  Substance and Sexual Activity   Alcohol use: Yes    Comment: occasionally   Drug use: Never   Sexual activity: Not Currently    Birth control/protection: Post-menopausal  Other Topics Concern   Not on file  Social History Narrative   Not on file   Social Drivers of Health   Financial Resource Strain: Not on file  Food Insecurity: No Food Insecurity (09/18/2022)   Hunger Vital Sign    Worried About Running Out of Food in the Last Year: Never true    Ran Out of Food in the Last Year: Never true  Transportation Needs: No Transportation Needs (09/18/2022)   PRAPARE - Administrator, Civil Service (Medical): No    Lack of Transportation (Non-Medical): No  Physical Activity: Not on file  Stress: Not on file  Social Connections: Not on file  Intimate Partner Violence: Not At Risk (04/10/2022)   Humiliation, Afraid, Rape, and Kick questionnaire    Fear of Current or Ex-Partner: No    Emotionally Abused: No    Physically Abused: No    Sexually Abused: No     OBSERVATIONS/OBJECTIVE:  BP 126/63 (BP Location: Left Arm, Patient Position: Sitting, Cuff Size: Normal)   Pulse 76   Temp 97.9 F (36.6 C) (Temporal)   Resp 14   Wt 151 lb 1.6 oz (68.5 kg)   SpO2 100%   BMI 25.14 kg/m   GENERAL: Patient is a well appearing female in no acute distress HEENT:  Sclerae anicteric.  Oropharynx clear and moist. No ulcerations or evidence of oropharyngeal candidiasis. Neck is supple.  NODES:  No cervical, supraclavicular, or axillary lymphadenopathy palpated.  BREAST EXAM: Right breast status postmastectomy and reconstruction. Left breast normal to inspection and palpation. LUNGS:  Clear to auscultation bilaterally.  No wheezes or rhonchi. HEART:  Regular rate and rhythm. No murmur appreciated. ABDOMEN:  Soft, nontender.  Positive, normoactive bowel sounds. No organomegaly palpated. MSK:  No focal spinal tenderness to  palpation. Full range of motion bilaterally in the upper extremities. EXTREMITIES:  No peripheral edema.   SKIN:  Clear with no obvious rashes or skin changes. No nail dyscrasia. NEURO:  Nonfocal. Well oriented.  Appropriate affect.   LABORATORY DATA:  None for this visit.  DIAGNOSTIC IMAGING:  None for this visit.     ASSESSMENT AND PLAN:  Hannah Murray is a pleasant 48 y.o. 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 Stretching exercises alleviating symptoms. -  Continue stretching exercises to alleviate tightness. - Continue using the compression sleeve as advised by the physical therapist.  Right breast cancer, status post mastectomy and radiation, on verzenio  with AI and OFS Tolerating it well. Mammogram scheduled for 9/25. She only needs left sided mammogram.  Adverse effects of hormonal therapy for breast cancer Experiences body pain and fatigue, recognized side effects. Staying active helps mitigate symptoms.   Total time: 30 min  *Total Encounter Time as defined by the Centers for Medicare and Medicaid Services includes, in addition to the face-to-face time of a patient visit (documented in the note above) non-face-to-face time: obtaining and reviewing outside history, ordering and reviewing medications, tests or procedures, care coordination (communications with other health care professionals or caregivers) and documentation in the medical record.

## 2023-11-19 ENCOUNTER — Other Ambulatory Visit: Payer: Self-pay | Admitting: Obstetrics and Gynecology

## 2023-11-19 ENCOUNTER — Encounter: Payer: Self-pay | Admitting: Hematology and Oncology

## 2023-11-19 ENCOUNTER — Ambulatory Visit: Payer: Self-pay | Admitting: *Deleted

## 2023-11-19 ENCOUNTER — Ambulatory Visit
Admission: RE | Admit: 2023-11-19 | Discharge: 2023-11-19 | Disposition: A | Discharge status: Home / Self Care | Source: Ambulatory Visit | Attending: Obstetrics and Gynecology | Admitting: Obstetrics and Gynecology

## 2023-11-19 VITALS — BP 127/67 | Wt 148.0 lb

## 2023-11-19 DIAGNOSIS — Z1231 Encounter for screening mammogram for malignant neoplasm of breast: Secondary | ICD-10-CM

## 2023-11-19 DIAGNOSIS — Z1211 Encounter for screening for malignant neoplasm of colon: Secondary | ICD-10-CM

## 2023-11-19 DIAGNOSIS — Z1239 Encounter for other screening for malignant neoplasm of breast: Secondary | ICD-10-CM

## 2023-11-19 NOTE — Patient Instructions (Signed)
 Explained breast self awareness with [facility]. Patient did not need a Pap smear today due to last Pap smear and HPV typing was 09/25/2022. Let her know [facility] will cover Pap smears and HPV typing every 5 years unless has a history of abnormal Pap smears. Referred patient to the [facility] for a screening mammogram on mobile unit. Appointment scheduled Thursday, November 19, 2023 at 1030. Patient aware of appointment and will be there. Let patient know the Breast Center will follow up with her within the next couple weeks with results of her mammogram by letter or phone. Gwyneth Jacob verbalized understanding.  Islam Villescas, Wanda Ship, RN 9:46 AM

## 2023-11-19 NOTE — Progress Notes (Addendum)
 Ms. Hannah Murray is a 48 y.o. female who presents to [facility] clinic today with no complaints.    Pap Smear: Pap smear not completed today. Last Pap smear was 09/18/2022 at [facility] clinic and was normal with negative HPV. Per patient has no history of an abnormal Pap smear. Last Pap smear result is available in Epic.   Physical exam: Breasts Left breast is larger than right breast due to history of right breast mastectomy with implant due to history of breast cancer diagnosed 05/16/2021. No skin abnormalities bilateral breasts. No nipple retraction bilateral breasts. No nipple discharge bilateral breasts. No lymphadenopathy. No lumps palpated bilateral breasts. No complaints of pain or tenderness on exam.  MM 3D SCREENING MAMMOGRAM UNILATERAL LEFT BREAST Result Date: 09/22/2022 CLINICAL DATA:  Screening. Status post right mastectomy in 2023 EXAM: DIGITAL SCREENING UNILATERAL LEFT MAMMOGRAM WITH CAD AND TOMOSYNTHESIS TECHNIQUE: Left screening digital craniocaudal and mediolateral oblique mammograms were obtained. Left screening digital breast tomosynthesis was performed. The images were evaluated with computer-aided detection. COMPARISON:  Previous exam(s). ACR Breast Density Category c: The breasts are heterogeneously dense, which may obscure small masses. FINDINGS: The patient has had a right mastectomy. There are no findings suspicious for malignancy. IMPRESSION: No mammographic evidence of malignancy. A result letter of this screening mammogram will be mailed directly to the patient. RECOMMENDATION: Screening mammogram in one year.  (Code:[id number]-41M) BI-RADS CATEGORY  1: Negative. Electronically Signed   By: Dirk Arrant M.D.   On: 09/22/2022 10:19   MM CLIP PLACEMENT RIGHT Result Date: 07/08/2021 CLINICAL DATA:  Status post MR guided core biopsy of mass in the UPPER INNER QUADRANT of the RIGHT breast. EXAM: 3D DIAGNOSTIC RIGHT MAMMOGRAM POST MRI BIOPSY COMPARISON:  Previous exam(s). FINDINGS: 3D  Mammographic images were obtained following MRI guided biopsy of enhancing mass in the UPPER INNER QUADRANT of the RIGHT breast and placement of a barbell clip. The biopsy marking clip is in expected position at the site of biopsy. In the region of the clip, a 1.9 centimeter hematoma has formed. Pressure dressing was applied. IMPRESSION: Appropriate positioning of the barbell shaped biopsy marking clip at the site of biopsy in the UPPER INNER RIGHT breast. Small hematoma after biopsy. Final Assessment: Post Procedure Mammograms for Marker Placement Electronically Signed   By: Almarie Daring M.D.   On: 07/08/2021 10:34  MM CLIP PLACEMENT RIGHT Result Date: 05/16/2021 CLINICAL DATA:  Post ultrasound-guided biopsy a mass in the right breast at the 10 o'clock position and ultrasound-guided biopsy of a mass in the right breast at the 12:30 position. EXAM: 3D DIAGNOSTIC RIGHT MAMMOGRAM POST ULTRASOUND BIOPSY COMPARISON:  Previous exam(s). FINDINGS: 3D Mammographic images were obtained following ultrasound guided biopsy of a mass in the right breast at the 10 o'clock position as well as ultrasound-guided biopsy of a mass in the right breast the 12:30 position. A ribbon shaped biopsy marking clip is present at the site of the biopsied mass in the right breast the 10 o'clock position. A wing shaped biopsy marking clip is present at the site of the biopsied mass in the right breast at the 12:30 position. IMPRESSION: 1. Ribbon shaped biopsy marking clip at site of biopsied mass in the right breast the 10 o'clock position. 2. Wing shaped biopsy marking clip at site of biopsied mass in the right breast at 12:30 position. Final Assessment: Post Procedure Mammograms for Marker Placement Electronically Signed   By: Delon Music M.D.   On: 05/16/2021 08:55  MS DIGITAL DIAG TOMO  BILAT Result Date: 05/06/2021 CLINICAL DATA:  48 year old female presenting for evaluation of a palpable lump in the upper-outer right breast.  EXAM: DIGITAL DIAGNOSTIC BILATERAL MAMMOGRAM WITH TOMOSYNTHESIS AND CAD; ULTRASOUND RIGHT BREAST LIMITED TECHNIQUE: Bilateral digital diagnostic mammography and breast tomosynthesis was performed. The images were evaluated with computer-aided detection.; Targeted ultrasound examination of the right breast was performed COMPARISON:  Previous exam(s). ACR Breast Density Category c: The breast tissue is heterogeneously dense, which may obscure small masses. FINDINGS: In the upper-outer quadrant of the right breast in the area of the palpable site there is a broad area of distortion. There is an asymmetry in the central posterior right breast. No other suspicious calcifications, masses or areas of distortion are seen in the bilateral breasts. Ultrasound targeted to the right breast at 10 o'clock, 5 cm from the nipple demonstrates an irregular hypoechoic ill-defined mass measuring at least 3.6 x 2.1 x 3.2 cm. Ultrasound of the right breast at 12:30, 8 cm from the nipple demonstrates a hypoechoic oval circumscribed mass measuring 1.0 x 0.4 x 1.0 cm. No abnormal lymph nodes are found in the right axilla. IMPRESSION: 1. There is a highly suspicious ill-defined palpable lump in the right breast at 10 o'clock measuring at least 3.6 cm. 2. There is an indeterminate mass in the right breast at 12:30, which may represent a fibroadenoma. 3.  No evidence of right axillary lymphadenopathy. 4.  No evidence of malignancy in the left breast. RECOMMENDATION: 1. Ultrasound guided biopsy is recommended for the right breast mass at 10 o'clock and the right breast mass at 12:30. We will work with the patient to schedule the patient at her earliest convenience. 2. Following biopsy, bilateral breast MRI is recommended to determine the extent of disease given the patient's breast tissue density, age and ill-defined appearance of the suspicious mass making it difficult to accurately measure. I have discussed the findings and recommendations  with the patient. If applicable, a reminder letter will be sent to the patient regarding the next appointment. BI-RADS CATEGORY  5: Highly suggestive of malignancy. Electronically Signed   By: Rosaline Collet M.D.   On: 05/06/2021 11:18    Pelvic/Bimanual Pap is not indicated today per BCCCP guidelines.   Smoking History: Patient has never smoked.   Patient Navigation: Patient education provided. Access to services provided for patient through [place] program. Spanish interpreter Hannah Murray from [facility] provided.   Colorectal Cancer Screening: Per patient has never had colonoscopy completed. Patient completed a FIT test 09/25/2022 that was negative. FIT Test given to patient today to complete. No complaints today.    Breast and Cervical Cancer Risk Assessment: Patient does not have family history of breast cancer. Patient has a personal history of breast cancer. Patient has no known genetic mutations or history of radiation treatment to the chest before age 40. Patient does not have history of cervical dysplasia, immunocompromised, or DES exposure in-utero.  Risk Scores as of Encounter on 11/19/2023     Hannah Murray           5-year 2.19%   Lifetime 17.08%   This patient is [place] but has no documented birth country, so the [place] model used data from [place] patients to calculate their risk score. Document a birth country in the Demographics activity for a more accurate score.         Last calculated by Hannah Murray, Hannah Murray, CMA on 11/19/2023 at  9:38 AM        A: BCCCP exam without pap  smear No complaints.  P: Referred patient to the [facility] for a screening mammogram on mobile unit. Appointment scheduled Thursday, November 19, 2023 at 1030.  Hannah Wanda SQUIBB, RN 11/19/2023 9:46 AM

## 2023-11-27 LAB — SPECIMEN STATUS REPORT

## 2023-11-27 LAB — FECAL OCCULT BLOOD, IMMUNOCHEMICAL: Fecal Occult Bld: NEGATIVE

## 2023-11-30 ENCOUNTER — Ambulatory Visit: Payer: Self-pay

## 2023-12-12 ENCOUNTER — Inpatient Hospital Stay: Attending: Hematology and Oncology

## 2023-12-12 VITALS — BP 130/65 | HR 73 | Temp 97.6°F | Resp 18

## 2023-12-12 DIAGNOSIS — Z5111 Encounter for antineoplastic chemotherapy: Secondary | ICD-10-CM | POA: Insufficient documentation

## 2023-12-12 DIAGNOSIS — Z452 Encounter for adjustment and management of vascular access device: Secondary | ICD-10-CM | POA: Insufficient documentation

## 2023-12-12 DIAGNOSIS — Z95828 Presence of other vascular implants and grafts: Secondary | ICD-10-CM

## 2023-12-12 DIAGNOSIS — C50411 Malignant neoplasm of upper-outer quadrant of right female breast: Secondary | ICD-10-CM | POA: Insufficient documentation

## 2023-12-12 MED ORDER — SODIUM CHLORIDE 0.9% FLUSH
10.0000 mL | Freq: Once | INTRAVENOUS | Status: AC
  Administered 2023-12-12: 10 mL

## 2023-12-12 MED ORDER — GOSERELIN ACETATE 3.6 MG ~~LOC~~ IMPL
3.6000 mg | DRUG_IMPLANT | Freq: Once | SUBCUTANEOUS | Status: AC
  Administered 2023-12-12: 3.6 mg via SUBCUTANEOUS
  Filled 2023-12-12: qty 3.6

## 2023-12-17 ENCOUNTER — Other Ambulatory Visit: Payer: Self-pay | Admitting: Hematology and Oncology

## 2024-01-06 ENCOUNTER — Inpatient Hospital Stay: Payer: Self-pay | Admitting: Hematology and Oncology

## 2024-01-06 ENCOUNTER — Telehealth: Payer: Self-pay | Admitting: Hematology and Oncology

## 2024-01-06 ENCOUNTER — Encounter: Payer: Self-pay | Admitting: Hematology and Oncology

## 2024-01-06 ENCOUNTER — Telehealth: Payer: Self-pay

## 2024-01-06 NOTE — Telephone Encounter (Signed)
 Used interpreter [employer] [phone].SABRA spoke with patient and she is out of town so unable to make appointment today.  Will send to scheduling to reschedule.[email or web address]

## 2024-01-06 NOTE — Telephone Encounter (Signed)
 Patient called in stating she wants to reschedule appointment with Dr. Loretha to 02/05/2024. Patient aware of date/time change.

## 2024-01-06 NOTE — Telephone Encounter (Signed)
 I reached out to patient via interpreting services to reschedule appointment from 11/12 per staff message. I scheduled appointment for 01/12/2024 and mailed printed reminder. I advised patient to call back if time/date does not work for her.

## 2024-01-09 ENCOUNTER — Inpatient Hospital Stay: Payer: Self-pay | Attending: Hematology and Oncology

## 2024-01-09 VITALS — BP 125/66 | HR 71 | Temp 97.3°F | Resp 20

## 2024-01-09 DIAGNOSIS — Z5111 Encounter for antineoplastic chemotherapy: Secondary | ICD-10-CM | POA: Insufficient documentation

## 2024-01-09 DIAGNOSIS — Z95828 Presence of other vascular implants and grafts: Secondary | ICD-10-CM

## 2024-01-09 DIAGNOSIS — C50411 Malignant neoplasm of upper-outer quadrant of right female breast: Secondary | ICD-10-CM | POA: Insufficient documentation

## 2024-01-09 MED ORDER — GOSERELIN ACETATE 3.6 MG ~~LOC~~ IMPL
3.6000 mg | DRUG_IMPLANT | Freq: Once | SUBCUTANEOUS | Status: AC
  Administered 2024-01-09: 3.6 mg via SUBCUTANEOUS
  Filled 2024-01-09: qty 3.6

## 2024-01-11 ENCOUNTER — Other Ambulatory Visit: Payer: Self-pay | Admitting: Hematology and Oncology

## 2024-01-12 ENCOUNTER — Inpatient Hospital Stay: Admitting: Hematology and Oncology

## 2024-01-28 ENCOUNTER — Inpatient Hospital Stay: Admitting: Hematology and Oncology

## 2024-02-05 ENCOUNTER — Inpatient Hospital Stay: Attending: Hematology and Oncology | Admitting: Hematology and Oncology

## 2024-02-05 ENCOUNTER — Telehealth: Payer: Self-pay

## 2024-02-05 DIAGNOSIS — C50411 Malignant neoplasm of upper-outer quadrant of right female breast: Secondary | ICD-10-CM | POA: Insufficient documentation

## 2024-02-05 DIAGNOSIS — Z5111 Encounter for antineoplastic chemotherapy: Secondary | ICD-10-CM | POA: Insufficient documentation

## 2024-02-05 NOTE — Telephone Encounter (Signed)
 Attempted to call patient on primary number regarding today's appointment with assistance of Spanish interpreter. No answer. Interpreter left voicemail providing patient with clinic number to reschedule appointments.

## 2024-02-13 ENCOUNTER — Inpatient Hospital Stay

## 2024-02-13 VITALS — BP 126/71 | HR 82 | Temp 97.5°F | Resp 20

## 2024-02-13 DIAGNOSIS — C50411 Malignant neoplasm of upper-outer quadrant of right female breast: Secondary | ICD-10-CM

## 2024-02-13 DIAGNOSIS — Z95828 Presence of other vascular implants and grafts: Secondary | ICD-10-CM

## 2024-02-13 MED ORDER — GOSERELIN ACETATE 3.6 MG ~~LOC~~ IMPL
3.6000 mg | DRUG_IMPLANT | Freq: Once | SUBCUTANEOUS | Status: AC
  Administered 2024-02-13: 3.6 mg via SUBCUTANEOUS
  Filled 2024-02-13: qty 3.6

## 2024-02-14 ENCOUNTER — Other Ambulatory Visit: Payer: Self-pay | Admitting: Hematology and Oncology

## 2024-02-24 ENCOUNTER — Other Ambulatory Visit (HOSPITAL_COMMUNITY): Payer: Self-pay

## 2024-03-01 ENCOUNTER — Other Ambulatory Visit (HOSPITAL_COMMUNITY): Payer: Self-pay

## 2024-03-08 ENCOUNTER — Encounter: Payer: Self-pay | Admitting: Hematology and Oncology

## 2024-03-11 ENCOUNTER — Other Ambulatory Visit: Payer: Self-pay | Admitting: Adult Health

## 2024-03-12 ENCOUNTER — Inpatient Hospital Stay: Attending: Hematology and Oncology

## 2024-03-12 VITALS — BP 118/72 | HR 62 | Temp 97.3°F | Resp 20

## 2024-03-12 DIAGNOSIS — Z5111 Encounter for antineoplastic chemotherapy: Secondary | ICD-10-CM | POA: Insufficient documentation

## 2024-03-12 DIAGNOSIS — Z452 Encounter for adjustment and management of vascular access device: Secondary | ICD-10-CM | POA: Insufficient documentation

## 2024-03-12 DIAGNOSIS — C50411 Malignant neoplasm of upper-outer quadrant of right female breast: Secondary | ICD-10-CM | POA: Insufficient documentation

## 2024-03-12 MED ORDER — SODIUM CHLORIDE 0.9% FLUSH
10.0000 mL | Freq: Once | INTRAVENOUS | Status: AC
  Administered 2024-03-12: 10 mL

## 2024-03-12 MED ORDER — GOSERELIN ACETATE 3.6 MG ~~LOC~~ IMPL
3.6000 mg | DRUG_IMPLANT | Freq: Once | SUBCUTANEOUS | Status: AC
  Administered 2024-03-12: 3.6 mg via SUBCUTANEOUS

## 2024-03-21 ENCOUNTER — Ambulatory Visit

## 2024-03-28 ENCOUNTER — Ambulatory Visit: Payer: Self-pay

## 2024-04-01 ENCOUNTER — Other Ambulatory Visit: Payer: Self-pay | Admitting: Adult Health

## 2024-04-04 ENCOUNTER — Ambulatory Visit: Payer: Self-pay

## 2024-04-09 ENCOUNTER — Inpatient Hospital Stay: Attending: Hematology and Oncology

## 2024-05-06 ENCOUNTER — Inpatient Hospital Stay

## 2024-05-06 ENCOUNTER — Inpatient Hospital Stay: Attending: Hematology and Oncology | Admitting: Hematology and Oncology
